# Patient Record
Sex: Female | Born: 1940 | Race: White | Hispanic: No | Smoking: Current every day smoker
Health system: Southern US, Community
[De-identification: ages and names within clinical notes are randomized; demographics above are authoritative.]

## PROBLEM LIST (undated history)

## (undated) ENCOUNTER — Emergency Department (HOSPITAL_COMMUNITY): Discharge status: Absent without leave | Payer: Self-pay

## (undated) DIAGNOSIS — E7800 Pure hypercholesterolemia, unspecified: Secondary | ICD-10-CM

## (undated) DIAGNOSIS — E039 Hypothyroidism, unspecified: Secondary | ICD-10-CM

## (undated) DIAGNOSIS — K219 Gastro-esophageal reflux disease without esophagitis: Secondary | ICD-10-CM

## (undated) DIAGNOSIS — M1990 Unspecified osteoarthritis, unspecified site: Secondary | ICD-10-CM

## (undated) DIAGNOSIS — R0602 Shortness of breath: Secondary | ICD-10-CM

## (undated) DIAGNOSIS — J449 Chronic obstructive pulmonary disease, unspecified: Secondary | ICD-10-CM

## (undated) DIAGNOSIS — R0600 Dyspnea, unspecified: Secondary | ICD-10-CM

## (undated) DIAGNOSIS — I10 Essential (primary) hypertension: Secondary | ICD-10-CM

## (undated) DIAGNOSIS — I739 Peripheral vascular disease, unspecified: Secondary | ICD-10-CM

## (undated) DIAGNOSIS — R413 Other amnesia: Secondary | ICD-10-CM

## (undated) DIAGNOSIS — E669 Obesity, unspecified: Secondary | ICD-10-CM

## (undated) DIAGNOSIS — E0590 Thyrotoxicosis, unspecified without thyrotoxic crisis or storm: Secondary | ICD-10-CM

## (undated) DIAGNOSIS — R0609 Other forms of dyspnea: Secondary | ICD-10-CM

## (undated) HISTORY — DX: Other amnesia: R41.3

## (undated) HISTORY — DX: Chronic obstructive pulmonary disease, unspecified: J44.9

## (undated) HISTORY — DX: Peripheral vascular disease, unspecified: I73.9

## (undated) HISTORY — PX: TUBAL LIGATION: SHX77

## (undated) HISTORY — DX: Hypothyroidism, unspecified: E03.9

## (undated) HISTORY — DX: Dyspnea, unspecified: R06.00

## (undated) HISTORY — DX: Pure hypercholesterolemia, unspecified: E78.00

## (undated) HISTORY — DX: Other forms of dyspnea: R06.09

## (undated) HISTORY — PX: DESCENDING AORTIC ANEURYSM REPAIR W/ STENT: SHX1456

## (undated) HISTORY — DX: Essential (primary) hypertension: I10

## (undated) HISTORY — DX: Obesity, unspecified: E66.9

## (undated) HISTORY — DX: Shortness of breath: R06.02

---

## 1999-04-10 ENCOUNTER — Ambulatory Visit (HOSPITAL_COMMUNITY): Admission: RE | Admit: 1999-04-10 | Discharge: 1999-04-10 | Payer: Self-pay | Admitting: *Deleted

## 2000-04-22 ENCOUNTER — Ambulatory Visit (HOSPITAL_COMMUNITY): Admission: RE | Admit: 2000-04-22 | Discharge: 2000-04-22 | Payer: Self-pay | Admitting: *Deleted

## 2000-10-21 ENCOUNTER — Ambulatory Visit: Admission: RE | Admit: 2000-10-21 | Discharge: 2000-10-21 | Payer: Self-pay | Admitting: Family Medicine

## 2001-11-17 ENCOUNTER — Ambulatory Visit (HOSPITAL_COMMUNITY): Admission: RE | Admit: 2001-11-17 | Discharge: 2001-11-17 | Payer: Self-pay | Admitting: Family Medicine

## 2001-11-24 ENCOUNTER — Encounter: Payer: Self-pay | Admitting: Family Medicine

## 2001-11-24 ENCOUNTER — Encounter: Admission: RE | Admit: 2001-11-24 | Discharge: 2001-11-24 | Payer: Self-pay | Admitting: Family Medicine

## 2002-12-21 ENCOUNTER — Ambulatory Visit (HOSPITAL_COMMUNITY): Admission: RE | Admit: 2002-12-21 | Discharge: 2002-12-21 | Payer: Self-pay | Admitting: Family Medicine

## 2002-12-28 ENCOUNTER — Encounter: Admission: RE | Admit: 2002-12-28 | Discharge: 2002-12-28 | Payer: Self-pay | Admitting: Family Medicine

## 2003-04-26 ENCOUNTER — Encounter: Admission: RE | Admit: 2003-04-26 | Discharge: 2003-04-26 | Payer: Self-pay | Admitting: Internal Medicine

## 2003-06-28 ENCOUNTER — Ambulatory Visit (HOSPITAL_COMMUNITY): Admission: RE | Admit: 2003-06-28 | Discharge: 2003-06-28 | Payer: Self-pay | Admitting: Internal Medicine

## 2003-09-27 ENCOUNTER — Ambulatory Visit: Payer: Self-pay | Admitting: Family Medicine

## 2003-10-21 ENCOUNTER — Ambulatory Visit: Payer: Self-pay | Admitting: *Deleted

## 2003-12-20 ENCOUNTER — Ambulatory Visit: Payer: Self-pay | Admitting: Family Medicine

## 2004-01-03 ENCOUNTER — Ambulatory Visit (HOSPITAL_COMMUNITY): Admission: RE | Admit: 2004-01-03 | Discharge: 2004-01-03 | Payer: Self-pay | Admitting: Internal Medicine

## 2004-04-03 ENCOUNTER — Ambulatory Visit: Payer: Self-pay | Admitting: Internal Medicine

## 2004-06-16 ENCOUNTER — Ambulatory Visit: Payer: Self-pay | Admitting: Family Medicine

## 2004-11-06 ENCOUNTER — Ambulatory Visit: Payer: Self-pay | Admitting: Family Medicine

## 2004-12-28 ENCOUNTER — Ambulatory Visit: Payer: Self-pay | Admitting: Family Medicine

## 2005-08-22 ENCOUNTER — Ambulatory Visit: Payer: Self-pay | Admitting: Family Medicine

## 2005-10-29 ENCOUNTER — Ambulatory Visit: Payer: Self-pay | Admitting: Family Medicine

## 2006-01-01 ENCOUNTER — Ambulatory Visit: Payer: Self-pay | Admitting: Family Medicine

## 2006-01-21 ENCOUNTER — Ambulatory Visit (HOSPITAL_COMMUNITY): Admission: RE | Admit: 2006-01-21 | Discharge: 2006-01-21 | Payer: Self-pay | Admitting: Family Medicine

## 2006-02-05 ENCOUNTER — Ambulatory Visit: Payer: Self-pay | Admitting: Family Medicine

## 2006-04-10 ENCOUNTER — Ambulatory Visit: Payer: Self-pay | Admitting: Family Medicine

## 2006-10-02 ENCOUNTER — Encounter (INDEPENDENT_AMBULATORY_CARE_PROVIDER_SITE_OTHER): Payer: Self-pay | Admitting: *Deleted

## 2006-10-21 ENCOUNTER — Encounter (INDEPENDENT_AMBULATORY_CARE_PROVIDER_SITE_OTHER): Payer: Self-pay | Admitting: Family Medicine

## 2006-10-21 DIAGNOSIS — L408 Other psoriasis: Secondary | ICD-10-CM | POA: Insufficient documentation

## 2006-10-21 DIAGNOSIS — I10 Essential (primary) hypertension: Secondary | ICD-10-CM

## 2006-10-21 DIAGNOSIS — I7389 Other specified peripheral vascular diseases: Secondary | ICD-10-CM | POA: Insufficient documentation

## 2006-11-04 ENCOUNTER — Ambulatory Visit: Payer: Self-pay | Admitting: Internal Medicine

## 2006-11-04 DIAGNOSIS — J45909 Unspecified asthma, uncomplicated: Secondary | ICD-10-CM | POA: Insufficient documentation

## 2006-11-04 LAB — CONVERTED CEMR LAB
ALT: 19 units/L (ref 0–35)
AST: 19 units/L (ref 0–37)
Alkaline Phosphatase: 61 units/L (ref 39–117)
BUN: 15 mg/dL (ref 6–23)
Basophils Relative: 1 % (ref 0–1)
Creatinine, Ser: 0.88 mg/dL (ref 0.40–1.20)
Eosinophils Absolute: 0.4 10*3/uL (ref 0.0–0.7)
Eosinophils Relative: 4 % (ref 0–5)
HCT: 45.4 % (ref 36.0–46.0)
Hemoglobin: 15.4 g/dL — ABNORMAL HIGH (ref 12.0–15.0)
MCHC: 33.9 g/dL (ref 30.0–36.0)
MCV: 91.7 fL (ref 78.0–100.0)
Monocytes Absolute: 0.6 10*3/uL (ref 0.2–0.7)
Monocytes Relative: 7 % (ref 3–11)
RBC: 4.95 M/uL (ref 3.87–5.11)
Total Protein: 7.6 g/dL (ref 6.0–8.3)

## 2006-12-09 ENCOUNTER — Ambulatory Visit: Payer: Self-pay | Admitting: Family Medicine

## 2006-12-16 ENCOUNTER — Ambulatory Visit (HOSPITAL_COMMUNITY): Admission: RE | Admit: 2006-12-16 | Discharge: 2006-12-16 | Payer: Self-pay | Admitting: Family Medicine

## 2007-01-27 ENCOUNTER — Ambulatory Visit (HOSPITAL_COMMUNITY): Admission: RE | Admit: 2007-01-27 | Discharge: 2007-01-27 | Payer: Self-pay | Admitting: Family Medicine

## 2007-03-11 ENCOUNTER — Ambulatory Visit: Payer: Self-pay | Admitting: Family Medicine

## 2007-03-11 ENCOUNTER — Encounter (INDEPENDENT_AMBULATORY_CARE_PROVIDER_SITE_OTHER): Payer: Self-pay | Admitting: Family Medicine

## 2007-06-11 ENCOUNTER — Ambulatory Visit: Payer: Self-pay | Admitting: Internal Medicine

## 2008-03-12 ENCOUNTER — Ambulatory Visit: Payer: Self-pay | Admitting: Internal Medicine

## 2008-05-14 ENCOUNTER — Encounter: Admission: RE | Admit: 2008-05-14 | Discharge: 2008-05-14 | Payer: Self-pay | Admitting: Internal Medicine

## 2008-05-21 ENCOUNTER — Ambulatory Visit (HOSPITAL_COMMUNITY): Admission: RE | Admit: 2008-05-21 | Discharge: 2008-05-21 | Payer: Self-pay | Admitting: Internal Medicine

## 2008-05-28 ENCOUNTER — Encounter: Admission: RE | Admit: 2008-05-28 | Discharge: 2008-05-28 | Payer: Self-pay | Admitting: Internal Medicine

## 2008-06-25 ENCOUNTER — Ambulatory Visit (HOSPITAL_COMMUNITY): Admission: RE | Admit: 2008-06-25 | Discharge: 2008-06-25 | Payer: Self-pay | Admitting: Cardiovascular Disease

## 2008-09-14 ENCOUNTER — Inpatient Hospital Stay (HOSPITAL_COMMUNITY): Admission: RE | Admit: 2008-09-14 | Discharge: 2008-09-15 | Payer: Self-pay | Admitting: Cardiovascular Disease

## 2008-09-14 HISTORY — PX: CARDIAC CATHETERIZATION: SHX172

## 2008-09-14 HISTORY — PX: PERCUTANEOUS STENT INTERVENTION: SHX6019

## 2008-09-20 ENCOUNTER — Emergency Department (HOSPITAL_BASED_OUTPATIENT_CLINIC_OR_DEPARTMENT_OTHER): Admission: EM | Admit: 2008-09-20 | Discharge: 2008-09-20 | Payer: Self-pay | Admitting: Emergency Medicine

## 2008-10-18 ENCOUNTER — Inpatient Hospital Stay (HOSPITAL_COMMUNITY): Admission: RE | Admit: 2008-10-18 | Discharge: 2008-10-20 | Payer: Self-pay | Admitting: Cardiovascular Disease

## 2008-10-18 HISTORY — PX: PERCUTANEOUS STENT INTERVENTION: SHX6019

## 2008-10-18 HISTORY — PX: CARDIAC CATHETERIZATION: SHX172

## 2008-10-19 ENCOUNTER — Ambulatory Visit: Payer: Self-pay | Admitting: Vascular Surgery

## 2009-06-24 ENCOUNTER — Ambulatory Visit (HOSPITAL_COMMUNITY): Admission: RE | Admit: 2009-06-24 | Discharge: 2009-06-24 | Payer: Self-pay | Admitting: Internal Medicine

## 2010-02-05 ENCOUNTER — Encounter: Payer: Self-pay | Admitting: Orthopaedic Surgery

## 2010-04-14 ENCOUNTER — Other Ambulatory Visit: Payer: Self-pay | Admitting: Cardiovascular Disease

## 2010-04-14 ENCOUNTER — Ambulatory Visit
Admission: RE | Admit: 2010-04-14 | Discharge: 2010-04-14 | Disposition: A | Discharge status: Home / Self Care | Payer: 59 | Source: Ambulatory Visit | Attending: Cardiovascular Disease | Admitting: Cardiovascular Disease

## 2010-04-14 DIAGNOSIS — R0602 Shortness of breath: Secondary | ICD-10-CM

## 2010-04-20 ENCOUNTER — Inpatient Hospital Stay (HOSPITAL_COMMUNITY)
Admission: RE | Admit: 2010-04-20 | Discharge: 2010-04-21 | DRG: 253 | Disposition: A | Discharge status: Home / Self Care | Payer: Medicare Other | Source: Ambulatory Visit | Attending: Cardiovascular Disease | Admitting: Cardiovascular Disease

## 2010-04-20 DIAGNOSIS — Z7982 Long term (current) use of aspirin: Secondary | ICD-10-CM

## 2010-04-20 DIAGNOSIS — I10 Essential (primary) hypertension: Secondary | ICD-10-CM | POA: Diagnosis present

## 2010-04-20 DIAGNOSIS — T82898A Other specified complication of vascular prosthetic devices, implants and grafts, initial encounter: Secondary | ICD-10-CM | POA: Diagnosis present

## 2010-04-20 DIAGNOSIS — E669 Obesity, unspecified: Secondary | ICD-10-CM | POA: Diagnosis present

## 2010-04-20 DIAGNOSIS — Z87891 Personal history of nicotine dependence: Secondary | ICD-10-CM

## 2010-04-20 DIAGNOSIS — I70219 Atherosclerosis of native arteries of extremities with intermittent claudication, unspecified extremity: Principal | ICD-10-CM | POA: Diagnosis present

## 2010-04-20 DIAGNOSIS — E785 Hyperlipidemia, unspecified: Secondary | ICD-10-CM | POA: Diagnosis present

## 2010-04-20 DIAGNOSIS — I6529 Occlusion and stenosis of unspecified carotid artery: Secondary | ICD-10-CM | POA: Diagnosis present

## 2010-04-20 DIAGNOSIS — Z79899 Other long term (current) drug therapy: Secondary | ICD-10-CM

## 2010-04-20 DIAGNOSIS — Y849 Medical procedure, unspecified as the cause of abnormal reaction of the patient, or of later complication, without mention of misadventure at the time of the procedure: Secondary | ICD-10-CM | POA: Diagnosis present

## 2010-04-20 HISTORY — PX: PERCUTANEOUS STENT INTERVENTION: SHX6019

## 2010-04-20 LAB — POCT ACTIVATED CLOTTING TIME: Activated Clotting Time: 163 seconds

## 2010-04-20 LAB — BASIC METABOLIC PANEL
Glucose, Bld: 126 mg/dL — ABNORMAL HIGH (ref 70–99)
Potassium: 4.3 mEq/L (ref 3.5–5.1)

## 2010-04-20 LAB — CBC
HCT: 36.5 % (ref 36.0–46.0)
HCT: 36.7 % (ref 36.0–46.0)
Hemoglobin: 12.5 g/dL (ref 12.0–15.0)
Hemoglobin: 13.1 g/dL (ref 12.0–15.0)
MCHC: 34.1 g/dL (ref 30.0–36.0)
MCHC: 34.5 g/dL (ref 30.0–36.0)
MCV: 93.1 fL (ref 78.0–100.0)
MCV: 93.3 fL (ref 78.0–100.0)
MCV: 93.5 fL (ref 78.0–100.0)
Platelets: 231 10*3/uL (ref 150–400)
WBC: 6.2 10*3/uL (ref 4.0–10.5)
WBC: 7.3 10*3/uL (ref 4.0–10.5)
WBC: 9.2 10*3/uL (ref 4.0–10.5)

## 2010-04-21 LAB — BASIC METABOLIC PANEL
BUN: 19 mg/dL (ref 6–23)
CO2: 25 mEq/L (ref 19–32)
CO2: 30 mEq/L (ref 19–32)
Calcium: 9.3 mg/dL (ref 8.4–10.5)
Chloride: 106 mEq/L (ref 96–112)
Chloride: 102 mEq/L (ref 96–112)
Creatinine, Ser: 1.13 mg/dL (ref 0.4–1.2)
Creatinine, Ser: 0.92 mg/dL (ref 0.4–1.2)
Glucose, Bld: 157 mg/dL — ABNORMAL HIGH (ref 70–99)
Glucose, Bld: 113 mg/dL — ABNORMAL HIGH (ref 70–99)

## 2010-04-21 LAB — CBC
HCT: 38 % (ref 36.0–46.0)
Hemoglobin: 12.8 g/dL (ref 12.0–15.0)
MCH: 30.8 pg (ref 26.0–34.0)
MCHC: 34.1 g/dL (ref 30.0–36.0)
MCHC: 33.9 g/dL (ref 30.0–36.0)
MCV: 91.6 fL (ref 78.0–100.0)
MCV: 92.8 fL (ref 78.0–100.0)
Platelets: 259 10*3/uL (ref 150–400)
Platelets: 218 10*3/uL (ref 150–400)
RBC: 4.15 MIL/uL (ref 3.87–5.11)
RBC: 4.7 MIL/uL (ref 3.87–5.11)
RDW: 14 % (ref 11.5–15.5)
WBC: 7.2 10*3/uL (ref 4.0–10.5)

## 2010-04-22 LAB — URINALYSIS, ROUTINE W REFLEX MICROSCOPIC
Glucose, UA: NEGATIVE mg/dL
Ketones, ur: NEGATIVE mg/dL
Nitrite: NEGATIVE
Protein, ur: NEGATIVE mg/dL
Urobilinogen, UA: 0.2 mg/dL (ref 0.0–1.0)

## 2010-04-22 LAB — BASIC METABOLIC PANEL
CO2: 24 mEq/L (ref 19–32)
Calcium: 8.9 mg/dL (ref 8.4–10.5)
Chloride: 101 mEq/L (ref 96–112)
Creatinine, Ser: 0.97 mg/dL (ref 0.4–1.2)
GFR calc Af Amer: >60 mL/min (ref >60)
Sodium: 134 mEq/L — ABNORMAL LOW (ref 135–145)

## 2010-04-22 LAB — PROTIME-INR
INR: 0.9 (ref 0.00–1.49)
Prothrombin Time: 12.1 seconds (ref 11.6–15.2)

## 2010-04-22 LAB — CBC
Hemoglobin: 14.2 g/dL (ref 12.0–15.0)
MCHC: 34.5 g/dL (ref 30.0–36.0)
RBC: 4.46 MIL/uL (ref 3.87–5.11)
WBC: 6.7 10*3/uL (ref 4.0–10.5)

## 2010-04-22 LAB — TSH: TSH: 7.043 u[IU]/mL — ABNORMAL HIGH (ref 0.350–4.500)

## 2010-04-25 NOTE — Procedures (Signed)
[name], [name]                ACCOUNT NO.:  [id number]  MEDICAL RECORD NO.:  [id number]           PATIENT TYPE:  O  LOCATION:  6529                         FACILITY:  [facility]  PHYSICIAN:  Nanetta Batty, M.D.   DATE OF BIRTH:  01-Sep-1940  DATE OF PROCEDURE:  04/20/2010 DATE OF DISCHARGE:                   PERIPHERAL VASCULAR INVASIVE PROCEDURE   Abdominal aortogram with bifemoral runoff, PTA, and stent report.  [name] is a 69 year old mildly overweight divorced Caucasian female, mother of three, grandmother of four grandchildren who I recently saw on March 23, 2010.  She has a history of normal coronaries by cath September 14, 2008, and high-grade aortoiliac disease with claudication.  Risks factors include discontinued tobacco abuse, hypertension, hyperlipidemia.  Angiogram to her initially on September 14, 2008, revealing a 90% ostial right common iliac artery stenosis, which I stented with a 10 x 4 Smart stent.  She had an occluded left common iliac.  I brought her back on October 18, 2008, and attempted to recanalize the left common iliac unsuccessfully.  The plan was to do a fem-fem crossover graft, she never pursued this.  She continues to complain of claudication.  Dopplers recently performed earlier this month suggested "in-stent restenosis within the right common iliac artery stent."  She presents now for re-angiography and intervention prior to anticipated surgical revascularization.  PROCEDURE DESCRIPTION:  The patient was brought to the second floor [facility] angiographic suite in the post absorptive state.  She is premedicated with p.o. Valium, IV Versed and fentanyl.  Her right groin was prepped and shaved in usual sterile fashion.  Xylocaine 1% was used for local anesthesia.  A 5-French sheath was inserted into the right femoral artery using standard Seldinger technique.  A 5-French tennis racket catheter was used for midstream distal abdominal aortography  with bifemoral runoff using bolus chase digital subtraction step table technique.  Visipaque dye was used for the entirety of the case. Retrograde aortic pressure was monitored during the case.  ANGIOGRAPHIC RESULTS: 1. Abdominal aorta.     a.     Renal arteries - Normal.     b.     Infrarenal abdominal aorta - Normal. 2. Left lower extremity;     a.     Ostial/proximal left common iliac artery stenosis with      reconstitution by collaterals in the external iliac artery.     b.     Three-vessel runoff. 3. Right lower extremity;     a.     70% "in-stent restenosis within the proximal portion of the      right common iliac artery stent.  There was a 50-mm pullback      gradient after administration of 200 mcg of intra-arterial      nitroglycerin.     b.     Two-vessel runoff.  PROCEDURE DESCRIPTION:  The patient received 5000 units of heparin intravenously.  5-French sheath was exchanged over a Wholey wire for a 6- [place] bright tip long sheath.  PTA and stenting was performed of the area of "in-stent restenosis of 8 x 18 genesis on OPTA stent balloon pre- mount.  This deployed  at 8 atmospheres resulting in reduction of a 70% stenosis to 0% residual.  There was mild abdominal pain with deployment, which resolved with balloon deflation.  The guidewire and balloon catheter removed.  ACT was measured.  The patient left the lab in stable condition.  She will be hydrated overnight.  Sheath will be removed once the ACT falls below 170.  The patient was treated aspirin as well. Apparently, she is allergic to PLAVIX.  She will be discharged in the morning and get followup Dopplers and ABIs.  I have discussed the case with Dr. Waverly Ferrari who has felt that reintervention and fem- fem crossover grafting was a better option given the patient's body habitus.  He will see her prior to her discharge tomorrow.     Nanetta Batty, M.D.     JB/MEDQ  D:  04/20/2010  T:  04/21/2010   Job:  [id number]  cc:   [facility] [place] R. Renae Gloss, M.D.  Electronically Signed by Nanetta Batty M.D. on 04/25/2010 10:49:04 AM

## 2010-05-02 ENCOUNTER — Encounter (HOSPITAL_COMMUNITY)
Admission: RE | Admit: 2010-05-02 | Discharge: 2010-05-02 | Disposition: A | Discharge status: Home / Self Care | Payer: PRIVATE HEALTH INSURANCE | Source: Ambulatory Visit | Attending: Vascular Surgery | Admitting: Vascular Surgery

## 2010-05-02 LAB — URINE MICROSCOPIC-ADD ON

## 2010-05-02 LAB — ABO/RH: ABO/RH(D): A POS

## 2010-05-02 LAB — URINALYSIS, ROUTINE W REFLEX MICROSCOPIC
Bilirubin Urine: NEGATIVE
Glucose, UA: NEGATIVE mg/dL
Hgb urine dipstick: NEGATIVE
Ketones, ur: NEGATIVE mg/dL
Nitrite: NEGATIVE
Specific Gravity, Urine: 1.016 (ref 1.005–1.030)
pH: 5.5 (ref 5.0–8.0)

## 2010-05-02 LAB — COMPREHENSIVE METABOLIC PANEL
AST: 39 U/L — ABNORMAL HIGH (ref 0–37)
Albumin: 3.9 g/dL (ref 3.5–5.2)
Alkaline Phosphatase: 55 U/L (ref 39–117)
BUN: 13 mg/dL (ref 6–23)
Chloride: 104 mEq/L (ref 96–112)
Creatinine, Ser: 1.14 mg/dL (ref 0.4–1.2)
GFR calc Af Amer: 58 mL/min — ABNORMAL LOW (ref >60)
Potassium: 4.4 mEq/L (ref 3.5–5.1)
Total Protein: 6.8 g/dL (ref 6.0–8.3)

## 2010-05-02 LAB — CBC
HCT: 40.5 % (ref 36.0–46.0)
MCHC: 33.3 g/dL (ref 30.0–36.0)
Platelets: 233 10*3/uL (ref 150–400)
RDW: 14.7 % (ref 11.5–15.5)
WBC: 7.8 10*3/uL (ref 4.0–10.5)

## 2010-05-02 LAB — TYPE AND SCREEN
ABO/RH(D): A POS
Antibody Screen: NEGATIVE

## 2010-05-02 LAB — APTT: aPTT: 26 seconds (ref 24–37)

## 2010-05-02 LAB — SURGICAL PCR SCREEN: MRSA, PCR: NEGATIVE

## 2010-05-02 NOTE — H&P (Addendum)
[name], [name]                ACCOUNT NO.:  [id number]  MEDICAL RECORD NO.:  [id number]           PATIENT TYPE:  O  LOCATION:  6529                         FACILITY:  [facility]  PHYSICIAN:  Di Kindle. [name], M.D.DATE OF BIRTH:  03-05-1940  DATE OF ADMISSION:  04/20/2010 DATE OF DISCHARGE:                             HISTORY & PHYSICAL   DATE OF PLANNED ADMISSION:  May 09, 2010.  REASON FOR ADMISSION:  Disabling left lower extremity claudication with plans for elective right to left fem-fem bypass graft.  HISTORY:  This is a pleasant 70 year old woman who I had originally seen in consultation on October 19, 2008.  She had a long history of bilateral lower extremity claudication.  She had a chronic left common iliac artery occlusion, which could not be addressed from an endovascular standpoint, and she had undergone successful PTA and stenting of a right common iliac artery stenosis.  At that time, we were considering fem-fem bypass grafting, however, symptoms improved somewhat after her iliac stent in the right presumably secondary to increased collateral flow from her increased perfusion of the right hypogastric artery.  She was then lost to followup.  She was seen by Dr. Allyson Sabal and presented with progressive bilateral lower extremity claudication.  She underwent an arteriogram today, which showed some stenosis of the proximal right common iliac artery just above the previously placed stent, and this was successfully ballooned and stented with an excellent result.  I was consulted today to evaluate her for possible bypass grafting given that she had progressive claudication in the left lower extremity.  The patient describes a long history of pain in the left hip brought on by ambulation and relieved with rest.  This occurs at approximately one block of ambulation and does not occur simply with standing.  Over the last year, she states that these symptoms in the left side  have gradually progressed.  On the right side, she had developed the gradual onset of right thigh and calf claudication.  This too was brought on by ambulation and relieved with rest.  She had no history of nonhealing wounds or rest pain on either side.  PAST MEDICAL HISTORY:  Significant for hypertension and obesity.  She denies any history of diabetes, history of hypercholesterolemia, history of previous myocardial infarction, history of congestive heart failure, history of COPD.  PAST SURGICAL HISTORY:  Significant for previous tubal ligation.  She has had no previous abdominal surgery.  FAMILY HISTORY:  She is unaware of any history of premature cardiovascular disease.  SOCIAL HISTORY:  She is divorced.  She has three daughters.  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  She had smoked 1 to 1-1/2 packs per day for 47 years, but quit smoking 3 months ago.  REVIEW OF SYSTEMS:  GENERAL:  She had no recent weight loss, weight gain, or problems with her appetite.  CARDIOVASCULAR:  She had no chest pain, chest pressure, palpitations, or arrhythmias.  She does admit to some dyspnea on exertion.  She has no significant orthopnea.  She has had no history of stroke,  TIAs, or amaurosis fugax.  She had no history of DVT.  PULMONARY:  She has had no productive cough, bronchitis, asthma, or wheezing.  GI:  She has had no recent change in her bowel habits and has no history of peptic ulcer disease.  GU:  She has had no dysuria or frequency.  NEUROLOGIC:  She has had no dizziness, blackouts, headaches, or seizures.  MUSCULOSKELETAL:  She has had no significant arthritis, joint pain, or muscle pain.  PSYCHIATRIC:  She has had no depression, anxiety.  HEENT:  She had no significant change in her eyesight or hearing.  HEMATOLOGIC:  She has had no bleeding problems or clotting disorders.  INTEGUMENTARY:  She has had no rashes.  ALLERGIES: 1. PENICILLIN, which causes a  rash. 2. AMOXICILLIN, which causes a rash. 3. CONTRAST DYE, which causes a rash. 4. PLAVIX,  which causes rash, hives, and itching.  MEDICATIONS: 1. Calcium with magnesium 600 mg OTC 1 p.o. daily. 2. Vitamin D3 1000 units OTC 1 p.o. daily. 3. Bupropion SR 150 mg p.o. daily. 4. Levothroid (levothyroxine) 50 mcg p.o. daily. 5. Omeprazole 40 mg p.o. daily. 6. Losartan/hydrochlorothiazide 100/25 mg 1 p.o. daily. 7. Norvasc 5 mg p.o. daily. 8. Coreg 6.25 mg p.o. b.i.d. 9. Aspirin 325 mg p.o. daily.  PHYSICAL EXAMINATION:  GENERAL:  This is a pleasant 70 year old woman who appears her stated age.  She has moderate obesity. VITAL SIGNS:  Blood pressure is 127/63, heart rate is 74, saturation 99%, respiratory rate is 12. HEENT:  Unremarkable. LUNGS:  Clear bilaterally to auscultation without rales, rhonchi, or wheezing. CARDIOVASCULAR:  I do not detect any carotid bruits.  She has a regular rate and rhythm.  She has palpable radial pulses bilaterally.  She has a palpable right femoral pulse.  I cannot palpate a left femoral pulse. She has a palpable right dorsalis pedis pulse.  I cannot palpate pedal pulses on the left side.  She has no ischemic ulcers on her feet. ABDOMEN:  Obese and difficult to assess.  She has normal pitched bowel sounds with no masses appreciated. MUSCULOSKELETAL:  No major deformities or cyanosis. NEUROLOGIC:  She has no focal weakness or paresthesias. SKIN:  There are no ulcers or rashes.  I have independently reviewed her arteriogram from today, which shows a recurrent right common iliac artery stenosis proximally, which was successfully ballooned and stented with no residual gradient with an excellent result.  She has chronic occlusion of the left common iliac artery.  She has reconstitution of the common femoral artery on the left with no significant infrainguinal arterial occlusive disease bilaterally.  I have also reviewed her [facility], 12-lead EKG showed a sinus rhythm with a rate of 81.  No ST or T- wave changes.  This patient presents with disabling claudication of the left lower extremity, which she feels is prohibiting her from walking.  Symptoms have gradually progressed.  Fortunately, she has quit smoking, which is tremendously helpful.  She would like to pursue revascularization.  I have discussed the option of nonoperative treatment including a structured walking program.  She feels this is not been successful.  I have explained it in terms of revascularization.  She could not be recanalized from an endovascular standpoint, and I think that the best and safest option is a right-to-left fem-fem bypass graft.  We have discussed the indications for surgery and the potential complications including but not limited to bleeding, infection, wound healing problems, graft infection,  and graft thrombosis.  She understands that she has increased risk for infection and graft infection given her obesity.  I have quoted a 5-year patency of 70% for a fem-fem bypass graft.  All of her questions are answered.  She is agreeable to proceed.  Her surgery has been scheduled for May 09, 2010.     Di Kindle. [name], M.D.     CSD/MEDQ  D:  04/20/2010  T:  04/21/2010  Job:  [id number]  Electronically Signed by Waverly Ferrari M.D. on 05/02/2010 08:37:30 AM

## 2010-05-09 ENCOUNTER — Inpatient Hospital Stay (HOSPITAL_COMMUNITY)
Admission: RE | Admit: 2010-05-09 | Discharge: 2010-05-12 | DRG: 254 | Disposition: A | Discharge status: Home Health | Payer: PRIVATE HEALTH INSURANCE | Source: Ambulatory Visit | Attending: Vascular Surgery | Admitting: Vascular Surgery

## 2010-05-09 DIAGNOSIS — I743 Embolism and thrombosis of arteries of the lower extremities: Secondary | ICD-10-CM

## 2010-05-09 DIAGNOSIS — I70219 Atherosclerosis of native arteries of extremities with intermittent claudication, unspecified extremity: Principal | ICD-10-CM | POA: Diagnosis present

## 2010-05-09 DIAGNOSIS — Z87891 Personal history of nicotine dependence: Secondary | ICD-10-CM

## 2010-05-09 DIAGNOSIS — E669 Obesity, unspecified: Secondary | ICD-10-CM | POA: Diagnosis present

## 2010-05-09 DIAGNOSIS — Z7982 Long term (current) use of aspirin: Secondary | ICD-10-CM

## 2010-05-09 DIAGNOSIS — E039 Hypothyroidism, unspecified: Secondary | ICD-10-CM | POA: Diagnosis present

## 2010-05-09 DIAGNOSIS — I10 Essential (primary) hypertension: Secondary | ICD-10-CM | POA: Diagnosis present

## 2010-05-09 DIAGNOSIS — I7092 Chronic total occlusion of artery of the extremities: Secondary | ICD-10-CM | POA: Diagnosis present

## 2010-05-09 DIAGNOSIS — K219 Gastro-esophageal reflux disease without esophagitis: Secondary | ICD-10-CM | POA: Diagnosis present

## 2010-05-09 HISTORY — PX: FEMORAL-FEMORAL BYPASS GRAFT: SHX936

## 2010-05-10 DIAGNOSIS — I739 Peripheral vascular disease, unspecified: Secondary | ICD-10-CM

## 2010-05-10 LAB — CBC
HCT: 34.4 % — ABNORMAL LOW (ref 36.0–46.0)
Hemoglobin: 11.2 g/dL — ABNORMAL LOW (ref 12.0–15.0)
RBC: 3.69 MIL/uL — ABNORMAL LOW (ref 3.87–5.11)

## 2010-05-10 LAB — BASIC METABOLIC PANEL
BUN: 13 mg/dL (ref 6–23)
Calcium: 8.8 mg/dL (ref 8.4–10.5)
GFR calc non Af Amer: 47 mL/min — ABNORMAL LOW (ref >60)
Glucose, Bld: 161 mg/dL — ABNORMAL HIGH (ref 70–99)

## 2010-05-10 NOTE — Discharge Summary (Signed)
  [name], ULLMAN                ACCOUNT NO.:  [id number]  MEDICAL RECORD NO.:  [id number]           PATIENT TYPE:  O  LOCATION:  6529                         FACILITY:  [facility]  PHYSICIAN:  Nanetta Batty, M.D.   DATE OF BIRTH:  07-Mar-1940  DATE OF ADMISSION:  04/20/2010 DATE OF DISCHARGE:  04/21/2010                              DISCHARGE SUMMARY   DISCHARGE DIAGNOSES: 1. Increasing left lower extremity claudication. 2. Peripheral vascular disease with previous high-grade aortoiliac     disease and stenting on the right.     a.     Peripheral vascular angiogram with occluded left CIA totally      occluded with need for a rt. to lt fem-fem bypass, right 70% in-stent      restenosis to the right CIA undergoing percutaneous transluminal      angioplasty and re-stent by Dr. Allyson Sabal. 3. Hypertension. 4. Hyperlipidemia. 5. History of right carotid stenosis.  DISCHARGE CONDITION:  Improved.  PROCEDURES:  Peripheral vascular angiogram and percutaneous coronary intervention by Dr. Allyson Sabal.  Please note, Dr. Edilia Wiggins saw the patient in the hospital and plans will be for right to left fem-fem bypass on May 09, 2010.  DISCHARGE MEDICATIONS:  See medication reconciliation sheet.  DISCHARGE INSTRUCTIONS: 1. Increase activity slowly.  May shower.  No lifting for 1 week.  No     driving for 2 days. 2. Heart-healthy low-sodium diet. 3. Wash cath site with soap and water.  Call if any bleeding, swelling     or drainage. 4. Follow up with Dr. Allyson Sabal and the office will call with date and     time. 5. She will be scheduled for a lower extremity Doppler and the office     would call.  Please note, the patient has an allergy to PLAVIX, is only on aspirin in addition for her surgery.  LABORATORY DATA:  At discharge, hemoglobin 12.8, hematocrit 38, WBC 16.2, platelets 259.  Sodium 140, potassium 4, chloride 106, CO2 25, BUN 19, creatinine 1.13.  The patient was seen by Dr. Allyson Sabal and will  follow up as an outpatient in addition to her surgery.  The patient presented and underwent PTA and re-stent to her in-stent restenosis in the right and was seen by Dr. Edilia Wiggins for plans for right to left fem-fem bypass grafting.  By the next morning, she ambulated without problems and will follow up as instructed.     [name]. [name], N.P.   ______________________________ Nanetta Batty, M.D.    LRI/MEDQ  D:  04/24/2010  T:  04/25/2010  Job:  [id number]  cc:   Di Kindle. [name], M.D. Dr. Pryor Ochoa R. Renae Gloss, M.D.  Electronically Signed by [name] N.P. on 04/25/2010 05:14:35 PM Electronically Signed by Nanetta Batty M.D. on 05/10/2010 04:08:35 PM

## 2010-05-11 NOTE — Op Note (Signed)
[name], [name]                ACCOUNT NO.:  [id number]  MEDICAL RECORD NO.:  [id number]           PATIENT TYPE:  I  LOCATION:  3307                         FACILITY:  [facility]  PHYSICIAN:  Di Kindle. [name], M.D.DATE OF BIRTH:  [birth year]  DATE OF PROCEDURE:  05/09/2010 DATE OF DISCHARGE:                              OPERATIVE REPORT   PREOPERATIVE DIAGNOSIS:  Left lower extremity claudication with left common iliac artery occlusion.  POSTOPERATIVE DIAGNOSIS:  Left lower extremity claudication with left common iliac artery occlusion.  PROCEDURES: 1. A vein patch angioplasty of the right common femoral artery. 2. Right-to-left fem-fem bypass graft with an 8-mm interring PTFE     graft. 3. Placement of incisional vac.  SURGEON:  Di Kindle. [name], MD  ASSISTANT:  Janetta Hora. Fields, MD and Darl Householder, PA-C  ANESTHESIA:  General.  INDICATIONS:  This is a 70 year old woman who has had a long history of a left common iliac artery occlusion.  [name] has had previous PTA and stenting by Dr. Allyson Sabal of a proximal right common iliac artery stenosis. [name] had disabling claudication on the left and wished to pursue revascularization, it was felt that right-to-left fem-fem bypass grafting was the best option.  The procedure and potential complications including her increased risk of wound infection because of her obesity was discussed with the patient preoperatively.  All questions were answered and [name] was agreeable to proceed.  TECHNIQUE:  The patient was taken to the operating room and received a general anesthetic.  Her pannus was taped superiorly.  Both groins were prepped and draped in usual sterile fashion.  An oblique incision was made above the inguinal crease on the right and through this incision, the common femoral, superficial femoral, and deep femoral arteries were dissected free.  This was obviously a quite deep.  There was some plaque within the  common femoral artery and therefore, I felt that I would likely require a patch on the common femoral artery.  Therefore, through the same incision, I harvested a segment of saphenous vein which was ligated distally and then opened and spatulated.  Of note, the bifurcation was fairly high not far below the inguinal ligament.  This would obviously provide some challenge in terms of allowing and nice lie of the fem-fem graft.  Now, I felt that it was probably again have to be sewn essentially right angle so that when her pannus was released.  [name] would not kink her graft.  Same issue would be a problem on the left side.  Oblique incision was made in the left above the inguinal crease and again in the depths of the wound, the common femoral, superficial femoral and deep femoral arteries were dissected free.  This artery was soft and had no significant plaque at this level.  An 8-mm PTFE graft was tunneled between the two incisions and tunneled fairly close to the fascia.  The patient was then heparinized.  On the right side, the common femoral, superficial femoral and deep femoral arteries were controlled and a longitudinal arteriotomy was made.  I extended this onto the superficial femoral artery and  up above the area of the plaque and then sewed the vein patch, which was with right greater saphenous vein, use continuous 6-0 Prolene suture.  The vein patch was then opened longitudinally and then the right limb of the fem-fem graft sewn end to side to the vein patch using continuous 6-0 Prolene suture.  Again, this was essentially in her right angle because of the high bifurcation and her large abdomen and I did not want to have kinking when [name] stood up. The anastomosis was then completed and the artery backbled and flushed and then the graft was clamped.  The graft was then pulled to the appropriate length for anastomosis to the left groin.  The common femoral, superficial femoral and deep  femoral arteries were controlled. Longitudinal arteriotomy was made in the common femoral artery.  The graft was cut to the appropriate length and sewn end-to-side to the common femoral artery using continuous 6-0 Prolene suture.  The arteries were backbled and flushed appropriate, and the anastomosis completed and flow reestablished to the left leg.  At the completion, there was an excellent Doppler flow distal to the bypass graft in left, which augmented with the graft open.  Hemostasis was obtained in the wounds and the heparin was partially reversed with protamine.  Once good hemostasis was obtained, the wounds were closed with two deep layers of 2-0 Vicryl and the skin closed with 4-0 subcuticular stitch.  An incisional vac was placed on both groins.  Sterile dressing was applied. The patient tolerated the procedure well and was transferred to the recovery in stable condition.  All needle and sponge counts were correct.     Di Kindle. [name], M.D.     CSD/MEDQ  D:  05/09/2010  T:  05/09/2010  Job:  [id number]  cc:   Nanetta Batty, M.D.  Electronically Signed by Waverly Ferrari M.D. on 05/11/2010 10:51:46 AM

## 2010-05-14 ENCOUNTER — Emergency Department (HOSPITAL_COMMUNITY)
Admission: EM | Admit: 2010-05-14 | Discharge: 2010-05-14 | Disposition: A | Discharge status: Home / Self Care | Payer: PRIVATE HEALTH INSURANCE | Attending: Emergency Medicine | Admitting: Emergency Medicine

## 2010-05-14 ENCOUNTER — Emergency Department (HOSPITAL_COMMUNITY): Payer: PRIVATE HEALTH INSURANCE

## 2010-05-14 ENCOUNTER — Inpatient Hospital Stay (HOSPITAL_COMMUNITY)
Admission: EM | Admit: 2010-05-14 | Discharge: 2010-05-18 | DRG: 189 | Disposition: A | Discharge status: Home / Self Care | Payer: PRIVATE HEALTH INSURANCE | Attending: Internal Medicine | Admitting: Internal Medicine

## 2010-05-14 DIAGNOSIS — F3289 Other specified depressive episodes: Secondary | ICD-10-CM | POA: Diagnosis present

## 2010-05-14 DIAGNOSIS — Z87891 Personal history of nicotine dependence: Secondary | ICD-10-CM

## 2010-05-14 DIAGNOSIS — I739 Peripheral vascular disease, unspecified: Secondary | ICD-10-CM | POA: Insufficient documentation

## 2010-05-14 DIAGNOSIS — J45909 Unspecified asthma, uncomplicated: Secondary | ICD-10-CM | POA: Insufficient documentation

## 2010-05-14 DIAGNOSIS — I10 Essential (primary) hypertension: Secondary | ICD-10-CM | POA: Diagnosis present

## 2010-05-14 DIAGNOSIS — J441 Chronic obstructive pulmonary disease with (acute) exacerbation: Secondary | ICD-10-CM | POA: Diagnosis present

## 2010-05-14 DIAGNOSIS — J9600 Acute respiratory failure, unspecified whether with hypoxia or hypercapnia: Principal | ICD-10-CM | POA: Diagnosis present

## 2010-05-14 DIAGNOSIS — R0602 Shortness of breath: Secondary | ICD-10-CM | POA: Insufficient documentation

## 2010-05-14 DIAGNOSIS — R0682 Tachypnea, not elsewhere classified: Secondary | ICD-10-CM | POA: Insufficient documentation

## 2010-05-14 DIAGNOSIS — F329 Major depressive disorder, single episode, unspecified: Secondary | ICD-10-CM | POA: Diagnosis present

## 2010-05-14 DIAGNOSIS — R059 Cough, unspecified: Secondary | ICD-10-CM | POA: Insufficient documentation

## 2010-05-14 DIAGNOSIS — J209 Acute bronchitis, unspecified: Secondary | ICD-10-CM | POA: Diagnosis present

## 2010-05-14 DIAGNOSIS — J440 Chronic obstructive pulmonary disease with acute lower respiratory infection: Secondary | ICD-10-CM | POA: Diagnosis present

## 2010-05-14 DIAGNOSIS — R05 Cough: Secondary | ICD-10-CM | POA: Insufficient documentation

## 2010-05-14 DIAGNOSIS — B370 Candidal stomatitis: Secondary | ICD-10-CM | POA: Diagnosis not present

## 2010-05-14 DIAGNOSIS — Z9889 Other specified postprocedural states: Secondary | ICD-10-CM

## 2010-05-14 DIAGNOSIS — I2510 Atherosclerotic heart disease of native coronary artery without angina pectoris: Secondary | ICD-10-CM | POA: Insufficient documentation

## 2010-05-14 DIAGNOSIS — E039 Hypothyroidism, unspecified: Secondary | ICD-10-CM | POA: Diagnosis present

## 2010-05-14 LAB — CARDIAC PANEL(CRET KIN+CKTOT+MB+TROPI)
CK, MB: 2.4 ng/mL (ref 0.3–4.0)
Relative Index: INVALID (ref 0.0–2.5)
Relative Index: INVALID (ref 0.0–2.5)
Total CK: 92 U/L (ref 7–177)
Troponin I: 0.01 ng/mL (ref 0.00–0.06)

## 2010-05-14 LAB — BASIC METABOLIC PANEL
BUN: 19 mg/dL (ref 6–23)
CO2: 30 mEq/L (ref 19–32)
Chloride: 105 mEq/L (ref 96–112)
GFR calc non Af Amer: 47 mL/min — ABNORMAL LOW (ref >60)
Glucose, Bld: 138 mg/dL — ABNORMAL HIGH (ref 70–99)
Potassium: 4.4 mEq/L (ref 3.5–5.1)

## 2010-05-14 LAB — POCT CARDIAC MARKERS: CKMB, poc: 1.5 ng/mL (ref 1.0–8.0)

## 2010-05-14 LAB — DIFFERENTIAL
Eosinophils Absolute: 0.6 10*3/uL (ref 0.0–0.7)
Eosinophils Relative: 7 % — ABNORMAL HIGH (ref 0–5)
Lymphs Abs: 1.6 10*3/uL (ref 0.7–4.0)
Monocytes Absolute: 0.9 10*3/uL (ref 0.1–1.0)

## 2010-05-14 LAB — CBC
MCHC: 33.2 g/dL (ref 30.0–36.0)
MCV: 92.2 fL (ref 78.0–100.0)
Platelets: 235 10*3/uL (ref 150–400)
RDW: 14 % (ref 11.5–15.5)
WBC: 8.1 10*3/uL (ref 4.0–10.5)

## 2010-05-14 LAB — TSH: TSH: 3.018 u[IU]/mL (ref 0.350–4.500)

## 2010-05-15 ENCOUNTER — Inpatient Hospital Stay (HOSPITAL_COMMUNITY): Payer: PRIVATE HEALTH INSURANCE

## 2010-05-15 DIAGNOSIS — R0602 Shortness of breath: Secondary | ICD-10-CM

## 2010-05-15 LAB — BASIC METABOLIC PANEL
BUN: 19 mg/dL (ref 6–23)
Creatinine, Ser: 1.08 mg/dL (ref 0.4–1.2)
GFR calc non Af Amer: 51 mL/min — ABNORMAL LOW (ref >60)
Glucose, Bld: 121 mg/dL — ABNORMAL HIGH (ref 70–99)

## 2010-05-15 LAB — CBC
HCT: 33.4 % — ABNORMAL LOW (ref 36.0–46.0)
MCH: 29.6 pg (ref 26.0–34.0)
MCHC: 32.3 g/dL (ref 30.0–36.0)
MCV: 91.5 fL (ref 78.0–100.0)
Platelets: 229 10*3/uL (ref 150–400)
RDW: 13.6 % (ref 11.5–15.5)

## 2010-05-15 LAB — CARDIAC PANEL(CRET KIN+CKTOT+MB+TROPI)
CK, MB: 2.2 ng/mL (ref 0.3–4.0)
Troponin I: 0.01 ng/mL (ref 0.00–0.06)

## 2010-05-15 MED ORDER — TECHNETIUM TO 99M ALBUMIN AGGREGATED
5.5000 | Freq: Once | INTRAVENOUS | Status: AC | PRN
  Administered 2010-05-15: 5.5 via INTRAVENOUS

## 2010-05-15 MED ORDER — XENON XE 133 GAS
7.8000 | GAS_FOR_INHALATION | Freq: Once | RESPIRATORY_TRACT | Status: AC | PRN
  Administered 2010-05-15: 7.8 via RESPIRATORY_TRACT

## 2010-05-16 LAB — BASIC METABOLIC PANEL
CO2: 31 mEq/L (ref 19–32)
Calcium: 9.6 mg/dL (ref 8.4–10.5)
Chloride: 101 mEq/L (ref 96–112)
Creatinine, Ser: 1.38 mg/dL — ABNORMAL HIGH (ref 0.4–1.2)
GFR calc Af Amer: 46 mL/min — ABNORMAL LOW (ref >60)
Glucose, Bld: 98 mg/dL (ref 70–99)
Sodium: 140 mEq/L (ref 135–145)

## 2010-05-16 LAB — CBC
HCT: 35.4 % — ABNORMAL LOW (ref 36.0–46.0)
Hemoglobin: 11.6 g/dL — ABNORMAL LOW (ref 12.0–15.0)
MCH: 30.1 pg (ref 26.0–34.0)
MCHC: 32.8 g/dL (ref 30.0–36.0)
RBC: 3.85 MIL/uL — ABNORMAL LOW (ref 3.87–5.11)

## 2010-05-17 LAB — CBC
Hemoglobin: 11.7 g/dL — ABNORMAL LOW (ref 12.0–15.0)
MCH: 30 pg (ref 26.0–34.0)
MCHC: 32.6 g/dL (ref 30.0–36.0)
MCV: 92.1 fL (ref 78.0–100.0)
RBC: 3.9 MIL/uL (ref 3.87–5.11)

## 2010-05-17 LAB — BASIC METABOLIC PANEL
BUN: 39 mg/dL — ABNORMAL HIGH (ref 6–23)
CO2: 29 mEq/L (ref 19–32)
Chloride: 101 mEq/L (ref 96–112)
GFR calc Af Amer: 43 mL/min — ABNORMAL LOW (ref >60)
GFR calc non Af Amer: 35 mL/min — ABNORMAL LOW (ref >60)
Glucose, Bld: 88 mg/dL (ref 70–99)

## 2010-05-17 NOTE — Discharge Summary (Signed)
NAMEJERRIE, [name]                ACCOUNT NO.:  [id number]  MEDICAL RECORD NO.:  [id number]           PATIENT TYPE:  I  LOCATION:  2037                         FACILITY:  [facility]  PHYSICIAN:  Di Kindle. [name], M.D.DATE OF BIRTH:  09/17/1940  DATE OF ADMISSION:  05/09/2010 DATE OF DISCHARGE:  05/12/2010                              DISCHARGE SUMMARY   ADMISSION DIAGNOSIS:  Left lower extremity claudication with left common iliac artery occlusion.  HISTORY OF PRESENT ILLNESS:  This is a 70 year old woman who was originally seen in consultation October 19, 2008 by Dr. [name].  She had a long history of bilateral lower extremity claudication.  She had a chronic left common iliac artery occlusion which could not be addressed from an endovascular standpoint and she had undergone successful PTA and stenting of a right common iliac artery stenosis.  At that time, we were considering fem-fem bypass grafting, however, her symptoms improved somewhat after her iliac stent in the right presumably secondary to increased collateral flow from her increased perfusion of the right hypogastric artery.  She was then lost to follow up.  She was seen by Dr. Allyson Sabal and presented with progressive bilateral lower extremity claudication.  She underwent an arteriogram which revealed some stenosis of the proximal right common iliac artery just above the previously placed stent and this was successfully ballooned and stented with an excellent result.  Dr. [name] was consulted to evaluate her for possible bypass grafting given that she had progressive claudication in the left lower extremity.  The patient describes a long history of pain in the left hip brought on by ambulation and relieved with rest.  This occurs at approximately 1 block of ambulation and does not occur simply with standing.  Over the last year, she states that these symptoms in the left side have gradually progressed.  On the right  side, she has developed a gradual onset of right thigh and calf claudication.  This too is brought on by ambulation and relieved with rest.  She has no history of nonhealing wound or rest pain on either side.  HOSPITAL COURSE:  The patient  was admitted to the hospital and taken to the operating room on May 09, 2010 where she underwent a vein patch angioplasty of the right common femoral artery as well as right to left fem-fem bypass graft with an 8-mm entering PTFE graft and placement of incisional VACS.  She tolerated the procedure well and was transported to the recovery room in satisfactory condition.  On postoperative day #1, she had brisk Doppler flow bilaterally and she was stable.  By postoperative day #2, her incisional VACS were removed and her incisions were clean, dry, and intact.  She had 2+ dorsalis pedal pulses on the right and 1+ dorsalis pedal pulses on the left.  She did have ABIs postoperatively. On the right it was 0.79 and on the left is 0.68.  Her postoperative course also included increasing ambulation as well as increasing intake of solids without difficulty.  She is discharged home on postoperative day #3.  DISCHARGE INSTRUCTIONS:  She is discharged home with extensive  instructions on wound care and progressive ambulation.  She is instructed not to drive or perform any heavy lifting until returning to see Dr. [name] in the office.  FOLLOWUP:  The patient is to follow up with Dr. [name] in 2 weeks.    ______________________________ Newton Pigg, PA   ______________________________ Di Kindle. [name], M.D.    SE/MEDQ  D:  05/12/2010  T:  05/12/2010  Job:  [id number]  cc:   Di Kindle. [name], M.D.  Electronically Signed by Waverly Ferrari M.D. on 05/17/2010 08:00:25 AM

## 2010-05-23 NOTE — Discharge Summary (Signed)
  [name], [name]                ACCOUNT NO.:  [id number]  MEDICAL RECORD NO.:  [id number]           PATIENT TYPE:  I  LOCATION:  2037                         FACILITY:  [facility]  PHYSICIAN:  Di Kindle. [name], M.D.DATE OF BIRTH:  [birth year]  DATE OF ADMISSION:  05/09/2010 DATE OF DISCHARGE:  05/12/2010                              DISCHARGE SUMMARY   ADDENDUM  DISCHARGE DIAGNOSES: 1. Disabling left lower extremity claudication with status post vein     patch angioplasty of the right common femoral artery as well as     right to left femoral-femoral bypass graft and placement of     incisional VAC on May 09, 2010. 2. Hypertension. 3. Obesity. 4. History of tubal ligation.  DISCHARGE MEDICATIONS: 1. Oxycodone 5 mg 1-2 p.o. q.6 hours p.r.n. pain. 2. Coreg 25 mg p.o. b.i.d. 3. Aspirin 325 mg p.o. daily. 4. Norvasc 5 mg p.o. daily. 5. Losartan/hydrochlorothiazide 100/25 mg 1 p.o. daily. 6. Prilosec 40 mg p.o. daily. 7. Levothyroxine 50 mcg p.o. daily. 8. Wellbutrin SR 150 mg p.o. daily. 9. Calcium with magnesium 600 mg p.o. b.i.d. 10.Vitamin D 2000 units 1 p.o. daily.    ______________________________ Newton Pigg, PA   ______________________________ Di Kindle. [name], M.D.    SE/MEDQ  D:  05/12/2010  T:  05/13/2010  Job:  [id number]  Electronically Signed by Waverly Ferrari M.D. on 05/23/2010 04:50:32 PM

## 2010-05-24 ENCOUNTER — Ambulatory Visit: Payer: Medicare Other | Admitting: Vascular Surgery

## 2010-05-24 NOTE — Discharge Summary (Signed)
NAMEJAKERRIA, [name]                ACCOUNT NO.:  [id number]  MEDICAL RECORD NO.:  [id number]           PATIENT TYPE:  I  LOCATION:  1402                         FACILITY[facility]  [facility]  PHYSICIAN:  Altha Harm, MDDATE OF BIRTH:  1940/06/23  DATE OF ADMISSION:  05/14/2010 DATE OF DISCHARGE:  05/18/2010                              DISCHARGE SUMMARY   DISCHARGE DISPOSITION:  Home.  DISCHARGE DIAGNOSES: 1. Acute exacerbation of chronic obstructive pulmonary disease,     improving. 2. Tracheobronchitis, improved. 3. Acute hypoxic respiratory failure, resolved. 4. Hypertension. 5. Peripheral arterial disease. 6. Hypothyroidism. 7. Oral candidiasis. 8. The patient is status post right and left fem-fem bypass on April     24.  DISCHARGE MEDICATIONS:  Discharge medications include the following: 1. DuoNeb 2 puffs inhaled q.6 h p.r.n. shortness of breath. 2. Avelox 400 mg p.o. daily at bedtime x3 days. 3. Nystatin swish and swallow 5 mL p.o. q.i.d. 4. Aspirin 325 mg p.o. daily. 5. Bupropion SR 150 mg p.o. daily. 6. Calcium with magnesium 600 mg p.o. b.i.d. 7. Coreg 25 mg p.o. b.i.d. 8. Levothyroxine 50 mcg p.o. daily. 9. Losartan/hydrochlorothiazide 10/25 mg p.o. daily. 10.Norvasc 5 mg p.o. daily. 11.Omeprazole 40 mg p.o. daily. 12.Oxycodone 5 to 10 mg p.o. q.6 h p.r.n. pain. 13.Vitamin D 2000 units p.o. daily.  CONSULTANTS:  Di Kindle. [name], M.D. Vascular Surgery.  PROCEDURES:  None.  DIAGNOSTIC STUDIES: 1. Two-view chest x-ray done on admission which shows stable     cardiomegaly and no active lung disease. 2. V/Q scan on May 15, 2010, which shows low probability for acute     PE. 3. Two-view chest x-ray on May 15, 2010, which shows mild     cardiomegaly and low lung volumes.  No acute findings. 4. Portable chest x-ray on May 15, 2010, post PICC line placement     which shows PICC line in the right upper extremity at the distal     tip of the distal  superior vena cava. 5. A 2-D echocardiogram done on May 15, 2010, which shows normal LV     size and function with an EF of 55-60%.  There is mildly increased     left ventricular hypertrophy.  No regional wall motion     abnormalities and left ventricular diastolic function parameters     are normal.  PRIMARY CARE PHYSICIAN:  Merlene Laughter. Renae Gloss, M.D.  CODE STATUS:  Full code.  ALLERGIES:  Allergies are to following: 1. AMOXICILLIN. 2. PENICILLIN. 3. PLAVIX. 4. CONTRAST MEDIA. 5. ADHESIVE.  CHIEF COMPLAINT:  Shortness of breath.  HISTORY OF PRESENT ILLNESS:  Please refer to the H and P by Dr. Mauro Kaufmann for details of the HPI.  However, in short, this is a 70 year old patient recently discharged from the hospital after she had a vein patch angioplasty in the right common femoral vessel who presents to the emergency room with shortness of breath.  HOSPITAL COURSE: 1. Acute exacerbation of COPD.  The patient presented with acute     hypoxic respiratory failure and shortness of breath.  She was     evaluated  for causes and was found to have an acute exacerbation of     her COPD with likely acute tracheobronchitis.  The patient was     treated with inhaled beta agonists and Spiriva.  She was also     started on IV steroids and then transitioned over to p.o. steroids     on a tapering dose.  The patient improved clinically with this     regimen of management.  She was also started on Avelox and     completed 4 of 7 days of Avelox.  She is to take 3 more days and     then complete her treatment. 2. Oral candidiasis.  The patient being on prednisone developed oral     candidiasis and was treated with nystatin swish and swallow.  The     patient is to take it 5 mL 4 times a day until oropharynx is clear     or up to 7 days. 3. Hypertension.  The patient was continued on her usual medications.     The patient had received some extra fluids here in the hospital and     was given  Lasix while hospitalized.  She is not being sent home on     Lasix and is to resume on her usual medications.  I would recommend     the patient has her electrolytes checked in 1 week to ensure that     she has no renal insufficiency related to volume contraction.  Otherwise the patient remained stable and is on her usual medications.  In terms of her recent right to left fem-fem bypass, the patient was seen in hospital by Dr. [name] who states that he will arrange for followup duplex in 3 weeks in the office.  DISCHARGE PHYSICAL EXAMINATION:  GENERAL:  At the time of discharge, the patient is stable.  She is ambulating without any need for oxygen at this time and looks well. VITAL SIGNS:  Temperature is 97.5, heart rate 64, respiratory rate 18, O2 sats 95% on room air, blood pressure 115/63. HEENT EXAMINATION:  She is normocephalic, atraumatic.  Pupils are equally round and reactive to light and accommodation.  Extraocular movements are intact.  Oropharynx is moist.  The patient has white exudate on tongue characteristic of oral candidiasis.  Otherwise, there are no erythema or lesions noted. NECK EXAMINATION:  Trachea is midline.  No masses, no thyromegaly, no JVD, no carotid bruit. RESPIRATORY EXAMINATION:  The patient has a normal respiratory effort, equal excursion bilaterally.  No wheezing or rhonchi noted. CARDIOVASCULAR:  She has got normal S1 and S2.  No murmurs, rubs or gallops are noted.  PMI is nondisplaced.  No heaves or thrills on palpation. ABDOMEN:  Obese, soft, nontender, nondistended.  No masses, no hepatosplenomegaly noted.  DIETARY RESTRICTIONS:  The patient should be on a heart-healthy diet.  PHYSICAL RESTRICTIONS:  None.  FOLLOWUP:  The patient is to follow up with Dr. Andi Devon in her office in 1 week and have her electrolytes checked at that time.  She is to follow up with Dr. [name] in the office in 2 weeks and he will make arrangements for her  to have duplex done in followup in his office.  Total time for this discharge process including face-to-face time, 32 minutes.     Altha Harm, MD     MAM/MEDQ  D:  05/18/2010  T:  05/18/2010  Job:  [id number]  cc:   Merlene Laughter. Renae Gloss,  M.D. Fax: [phone]  Di Kindle. [name], M.D. [address] [place] [address]  Electronically Signed by Marthann Schiller MD on 05/24/2010 02:21:13 PM

## 2010-05-30 NOTE — Discharge Summary (Signed)
[name], [name]                ACCOUNT NO.:  [id number]   MEDICAL RECORD NO.:  [id number]          PATIENT TYPE:  INP   LOCATION:  2506                         FACILITY:  [facility]   PHYSICIAN:  Nanetta Batty, M.D.   DATE OF BIRTH:  27-Jun-1940   DATE OF ADMISSION:  09/14/2008  DATE OF DISCHARGE:  09/15/2008                               DISCHARGE SUMMARY   DISCHARGE DIAGNOSES:  1. Claudication with abnormal arterial Dopplers.  2. Positive CT angio with occluded left iliac and calcified right      iliac disease.  3. Percutaneous transluminal angioplasty and stent to the proximal      right internal carotid artery.      a.     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  4. Abnormal nuclear stress test.      a.     Normal coronary arteries and normal left ventricular       function.  5. Hypothyroidism by TSH, will need further evaluation by primary      care.  6. Tobacco abuse with tobacco consult for cessation in the hospital.  7. Hypertension, controlled.  8. Idioventricular rhythm x11 beats, irregular, questionable      paroxysmal atrial fibrillation.   DISCHARGE CONDITION:  Improved and stable.   PROCEDURES:  1. On September 14, 2008, combined left heart catheterization by Dr.      Nanetta Batty.  2. September 14, 2008, Percutaneous transluminal angioplasty and stent      deployment to the right internal carotid artery by Dr. Allyson Sabal with      plans for staged intervention in the future.   DISCHARGE MEDICATIONS:  See medication reconciliation sheet in computer  system.   DISCHARGE INSTRUCTIONS:  1. Increase activity slowly.  2. May shower.  3. No lifting for 1 week.  4. No driving for 2 days.  5. Low-sodium heart-healthy diet.  6. Have lab work done on September 22, 2008, this is to check her      thyroid eval.  7. Wash cath site with soap and water.  Call if any bleeding,      swelling, or drainage.  8. Follow up with Dr. Allyson Sabal on September 29, 2008 at 11:30 a.m.  9. You are scheduled for lower extremity arterial Dopplers at      [facility] on September 27, 2008 at 4 p.m.   The patient ambulated in the hall prior to discharge without problems.  Blood pressure at discharge 101/76, pulse 83.   PHYSICAL EXAMINATION:  HEART:  Regular rate and rhythm.  LUNGS:  Clear.  Cath site was stable without hematoma.   LABORATORY DATA:  Hemoglobin at discharge 13.9, hematocrit 40.7, WBC  8.8, platelets 198.  Chemistry; sodium 139, potassium 4.8, chloride 102,  CO2 30, BUN 12, creatinine 0.92, glucose 113.   Coags; protime 12.1, INR is 0.9, PTT 23.   UA was clear.  Calcium 9.3 and TSH was elevated at 7.043.  Further  evaluation at discharge  as outpatient.  Chest x-ray was done prior to  cath and no active disease was seen.  The patient will follow up with  Dr. Allyson Sabal as well as Dr. Andi Devon.      [name]. Annie Paras, N.P.      Nanetta Batty, M.D.  Electronically Signed    LRI/MEDQ  D:  09/15/2008  T:  09/16/2008  Job:  [id number]   cc:   Merlene Laughter. Renae Gloss, M.D.

## 2010-05-30 NOTE — Op Note (Signed)
[name], [name]                ACCOUNT NO.:  [id number]   MEDICAL RECORD NO.:  [id number]          PATIENT TYPE:  INP   LOCATION:  2506                         FACILITY:  [facility]   PHYSICIAN:  Nanetta Batty, M.D.   DATE OF BIRTH:  Jun 01, 1940   DATE OF PROCEDURE:  09/14/2008  DATE OF DISCHARGE:                               OPERATIVE REPORT   HISTORY OF PRESENT ILLNESS:  Ms. Briney is a 70 year old mildly  overweight Caucasian female referred to me for cardiovascular evaluation  because of claudication.  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  She underwent cardiac catheterization today via right femoral  approach essentially revealing normal coronary arteries and normal LV  function.  She presents now for abdominal aortography, bifemoral runoff,  and potential endovascular therapy for functional limiting claudication.   DESCRIPTION OF PROCEDURE:  A 5-French sheath used for cardiac  catheterization was used for the procedure, which was inserted using  standard Seldinger technique in the right femoral artery.  A pigtail  catheter was used for abdominal aortography with bifemoral runoff using  bolus chase digital subtraction step table technique.  Visipaque dye was  used for the entirety of the case.  Retrograde aortic pressures  monitored in the case.   ANGIOGRAPHIC RESULTS:  1. Abdominal aorta:      a.     Renal arteries - normal.      b.     Infrarenal abdominal aorta - tapered.  There were       nonobstructive narrowing towards the bifurcation.  2. Left lower extremity:      a.     Occluded left common iliac artery at the ostium with       reconstitution several centimeters distal to this with two-vessel       runoff.  3. Right lower extremity:      a.     Subtotaled occlusion of the proximal right common iliac       artery with a three-vessel runoff.   DESCRIPTION OF PROCEDURE:  The  patient received 3000 units of heparin  intravenously.  A 5-French sheath was exchanged over wire for a 6-French  bright tip 3-cm long Cordis sheath.  A PTA was performed with a 5.2  upgrading to a 6.3 balloon.  An abdominal aortography in the LAO oblique  confirmed the post PTA result.  Because of heavy calcification and  mismatch between the diseased segment and the post stenotic dilated  iliac, a nitinol self-expanding stent was chosen.  A Cordis 10 x 4 stent  was then deployed under precise angiographic and fluoroscopic control at  the origin of the right common iliac into the __________ dilated area,  and dilated with a 7.3 balloon resulting in reduction of a 99% stenosis  to less than 20% residual.  There was excellent stent apposition and  slightly aneurysmal, but otherwise disease-free segment.  The patient  tolerated the procedure well.  ACT was measured.  The  sheath was removed.  Pressure was applied on the groin to achieve  hemostasis.  The patient left the lab in stable condition.  She will be  treated with aspirin and Plavix, hydrated overnight and discharged home  in the morning.  Sedation will be given to staged PTA, and stenting of  her occluded left common iliac artery.      Nanetta Batty, M.D.  Electronically Signed     JB/MEDQ  D:  09/14/2008  T:  09/14/2008  Job:  [id number]   cc:   Merlene Laughter. Renae Gloss, M.D.  [facility]  Second Floor PV Angiographic Suite

## 2010-05-30 NOTE — Cardiovascular Report (Signed)
NAMELOUETTA, HOLLINGSHEAD                ACCOUNT NO.:  [id number]   MEDICAL RECORD NO.:  [id number]          PATIENT TYPE:  INP   LOCATION:  2506                         FACILITY:  [facility]   PHYSICIAN:  Nanetta Batty, M.D.   DATE OF BIRTH:  1940-07-19   DATE OF PROCEDURE:  DATE OF DISCHARGE:                            CARDIAC CATHETERIZATION   [name] is a 70 year old mildly overweight divorced Caucasian female,  mother of 3, grandmother of 4 grandchildren, who was initially referred  to me by Dr. Renae Gloss for a cardiovascular evaluation for claudication.  Her risk factors include hypertension and tobacco abuse.  She is a  retired Child psychotherapist.  Dopplers in our office appeared to indicate Brittney  Wiggins with an occluded distal abdominal aorta and ABIs were 0.5  range.  Echo was normal and Myoview showed subtle anterior ischemia.  She does complain of dyspnea.  CT angio showed an occluded left iliac  with calcified right iliac, though patent disease.  She presents now for  cardiac catheterization, abdominal aortography with runoff.   DESCRIPTION OF PROCEDURE:  The patient was brought to the second floor  [facility] in postabsorptive state.  She was  premedicated with p.o. Valium, IV versed, and fentanyl.  Her right groin  was prepped and shaved in usual sterile fashion.  Xylocaine 1% was used  for local anesthesia.  A 5-French sheath was inserted into the right  femoral artery using standard Seldinger technique.  5-French right and  left Judkins diagnostic catheters as well as 5-French pigtail catheter  were used for selective coronary angiography, a left ventriculography,  and subselective left internal mammary artery angiography.  Visipaque  dye was used for the entirety of the case.  Retrograde aortic,  ventricular pullback pressures were recorded.   HEMODYNAMICS:  1. Aortic systolic pressure 165, diastolic pressure 63.  2. Left ventricular systolic pressure 164 and  diastolic pressure 70.  3. Selective coronary angiography.      a.     Left main normal.      b.     LAD normal.      c.     Left circumflex normal.      d.     Right coronary artery is dominant, normal.  4. Left ventriculography; RAO left ventriculogram was performed using      20 mL of Visipaque dye at 10 mL per second.  The overall LVEF was      estimated greater than 60% without focal wall motion abnormalities.  5. Left internal mammary artery; this vessel was subselectively      visualized and was widely patent.  It is suitable for use during      coronary artery bypass grafting if necessary.   IMPRESSION:  [name] has essentially normal coronary arteries and  normal left ventricular function.  I think her dyspnea is multifactorial  and her mild anterior ischemia is probably breast attenuation artifact.  Continued medical therapy will be recommended.  Following this, the  peripheral procedure ensued.      Nanetta Batty, M.D.  Electronically Signed  JB/MEDQ  D:  09/14/2008  T:  09/14/2008  Job:  [id number]   cc:   Cardiac Catheterization Laboratory  [facility]  [place]. Renae Gloss, M.D.

## 2010-06-07 ENCOUNTER — Ambulatory Visit (INDEPENDENT_AMBULATORY_CARE_PROVIDER_SITE_OTHER): Payer: PRIVATE HEALTH INSURANCE | Admitting: Vascular Surgery

## 2010-06-07 ENCOUNTER — Encounter (INDEPENDENT_AMBULATORY_CARE_PROVIDER_SITE_OTHER): Payer: PRIVATE HEALTH INSURANCE

## 2010-06-07 DIAGNOSIS — Z48812 Encounter for surgical aftercare following surgery on the circulatory system: Secondary | ICD-10-CM

## 2010-06-07 DIAGNOSIS — I739 Peripheral vascular disease, unspecified: Secondary | ICD-10-CM

## 2010-06-07 DIAGNOSIS — I70219 Atherosclerosis of native arteries of extremities with intermittent claudication, unspecified extremity: Secondary | ICD-10-CM

## 2010-06-08 NOTE — Assessment & Plan Note (Signed)
OFFICE VISIT  [name], [name] DOB:  [birth year]                                       06/07/2010 HQION#:[id number]  I saw patient in the office today for follow-up after her recent right- to-left fem-fem bypass graft.  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  She had previous PTA and stenting of the proximal right common iliac artery by Dr. Allyson Sabal. She had disabling claudication on the left and wished to pursue revascularization.  She underwent a vein patch angioplasty of the right common femoral artery and right to left fem-fem bypass grafting with an 8-mm PTFE graft on 05/08/10.  She was discharged on 05/12/10. Postoperative ABI was 79% on the right and 68% on the left.  She came in for an outpatient visit.  She overall has been doing quite well and has no significant claudication in either leg.  She has had no rest pain. She has had no chest pain or chest pressure.  PHYSICAL EXAMINATION:  Temperature is 97.8, blood pressure 147/75, heart rate is 71.  Her incisions are healed nicely without any significant erythema, although she does have some swelling in her left groin consistent with lymphocele.  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  She has palpable posterior tibial pulses bilaterally.  I have written her a prescription for Keflex just to help prevent the lymphocele from becoming infected.  I would like to avoid evacuating the lymphocele, as this is associated with a slight increased risk of developing an infection, and hopefully this will improve on its own.  If not, we could consider exploring the left groin, evacuating the lymphocele, and placing a VAC on the left groin.  I will see her back in 2 weeks.  She knows to call sooner if she has problems.    Di Kindle. [name], [name]. Electronically Signed  CSD/MEDQ  Wiggins:  06/07/2010  T:   06/08/2010  Job:  [id number]  cc:   Nanetta Batty, [name].

## 2010-06-14 NOTE — Procedures (Unsigned)
VASCULAR LAB EXAM  INDICATION:  Follow-up from a right angioplasty of a right common femoral artery and a right to left fem-fem bypass graft with a history of a right AVF.  HISTORY: Diabetes:  No. Cardiac:  No. Hypertension:  Yes.  EXAM:  IMPRESSION:  No evidence of a pseudoaneurysm or arteriovenous fistula within the right groin area.  The common femoral vein, common femoral artery, and proximal anastomoses possessed normal waveforms and compressibility.  Questionable hematoma versus seroma within the right groin area at least 14-17 cm with internal echoes.  A questionable left hematoma versus seroma within the left groin area measuring 6.9 x 5.3 x 4.7 cm.  The right greater saphenous vein at the saphenofemoral junction not visualized, probably due to the hematoma versus the seroma within the groin area.  The right greater saphenous vein appears thrombosed within the proximal thigh but is compressible and with normal flow, augmentation, and compressible just past the proximal thigh to the distal thigh.  I discussed these findings with the Waverly Ferrari, M.D.  ___________________________________________ Di Kindle. [name], M.D.  OD/MEDQ  D:  06/07/2010  T:  06/07/2010  Job:  [id number]

## 2010-06-14 NOTE — H&P (Signed)
[name], [name]                ACCOUNT NO.:  [id number]  MEDICAL RECORD NO.:  [id number]           PATIENT TYPE:  E  LOCATION:  WLED                         FACILITY:  [facility]  PHYSICIAN:  Mauro Kaufmann, MD         DATE OF BIRTH:  04-01-1940  DATE OF ADMISSION:  05/14/2010 DATE OF DISCHARGE:                             HISTORY & PHYSICAL   PRIMARY CARE PHYSICIAN:  Merlene Laughter. Renae Gloss, MD  CHIEF COMPLAINT:  Shortness of breath.  HISTORY OF PRESENT ILLNESS:  A 70 year old female, who was recently discharged from the hospital after the patient had a vein patch angioplasty of the right common femoral artery and right-to-left fem-fem bypass graft done on May 09, 2010.  The patient was discharged on May 12, 2010, and after discharge the patient said that she developed shortness of breath.  The patient was seen in the ER last night and was given breathing treatments and was sent home, but the patient said that she was okay for few hours and after that she could not lie still in the bed and she had to get up.  Also she noticed that there was lower extremity swelling, which was not present before.  The patient stayed in the hospital for 3 days, during which time she also received IV fluids for the surgery.  The patient has longstanding history of hypertension, though she denies any history of congestive heart failure in the past. The patient also is a long time smoker.  She has smoked for 45 years, but quit smoking in January 2012.  There is no history of COPD as per the patient.  She says that she does not use any inhalers at home.  PAST MEDICAL HISTORY: 1. Peripheral arterial disease. 2. Asthma. 3. Hypertension.  PAST SURGICAL HISTORY:  The patient had iliac artery graft from bypass on May 09, 2010, as above.  SOCIAL HISTORY:  The patient denies any tobacco abuse.  No alcohol abuse.  No history of illicit drug abuse.  FAMILY HISTORY:  Noncontributory.  ALLERGIES:  The  patient is allergic to, 1. AMOXICILLIN. 2. PENICILLIN. 3. PLAVIX. 4. ADHESIVE. 5. IODINE. 6. IVP DYE (IODINE CONTAINING).  HOME MEDICATIONS:  Currently the patient is on include, 1. Aspirin 325 mg p.o. daily. 2. Bupropion SR 150 mg p.o. daily. 3. Calcium with magnesium 600 mg twice a day. 4. Carvedilol 25 mg twice a day. 5. Oxycodone 5 mg IR as needed. 6. Levothyroxine 50 mcg once a day. 7. Losartan and hydrochlorothiazide 100/25 once a day. 8. Vitamin D 2000 units daily. 9. Norvasc 5 mg once a day. 10.Omeprazole 40 mg p.o. daily.  PHYSICAL EXAMINATION:  VITAL SIGNS:  The patient's blood pressure is 163/84, temperature is 98.2, pulse 84, respirations 20. HEENT:  Head is atraumatic, normocephalic.  Eyes, extraocular motions intact.  Oral mucosa is pink and moist. NECK:  Supple. CHEST:  Bilateral wheezing noted throughout the lung fields. HEART:  S1 and S2 is regular in rate and rhythm. ABDOMEN:  Soft, nontender.  No organomegaly. EXTREMITIES:  Bilateral 1+ edema noted in both lower extremities.  IMAGING:  Pertinent  imaging studies; chest x-ray shows stable cardiomegaly and no active lung disease.  LABORATORY DATA:  Pertinent labs show WBC 8.1, hemoglobin 12.1, hematocrit is 36.4, platelet count 235.  Sodium 143, potassium 4.4, chloride is 105, CO2 of 30, BUN 19, creatinine 1.16, glucose is 138. BNP 115.  Four set of cardiac enzymes are negative.  REVIEW OF SYSTEMS:  HEENT:  There is no headache.  No blurred vision. NECK:  There is positive history of hypothyroidism.  CHEST:  Positive history of asthma.  No history of COPD.  HEART:  No history of CAD.  GI: No nausea, vomiting, or diarrhea.  GENITOURINARY:  No dysuria, urgency, or frequency of urination.  NEUROLOGIC:  No history of stroke or TIA in the past.  No history of seizures.  CARDIOVASCULAR:  The patient admits to having history of orthopnea and PND.  ASSESSMENT/PLAN: 1. Dyspnea, pulmonary edema versus chronic  obstructive pulmonary     disease exacerbation. 2. Hypertension. 3. Peripheral arterial disease with recent fem-fem bypass.  PLAN: 1. Dyspnea.  The patient's dyspnea could be either pulmonary edema     versus COPD exacerbation.  I am going to obtain a 2-D     echocardiogram and also start the patient on IV Lasix.  We will     measure the strict I's and O's.  Though the patient's BNP is not     very high, but she still has diastolic dysfunction because of     longstanding history of hypertension.  Given the fact that the     patient was also in the hospital for 3 days and received IV fluids,     which may have triggered the patient's acute pulmonary edema.  The     patient also has a history of longstanding history of tobacco     abuse, though there is no history of emphysema.  The patient did     not use any inhalers in the past, so we are also going to treat     this as a possible COPD exacerbation.  The patient will be started     on prednisone p.o. along with Avelox antibiotics.  I am also going     to start the patient on nebulizer treatment p.r.n.  If the patient     improves with Lasix and has good diuretics response, the prednisone     can be stopped as per discretion of the rounding physician.  I am     going to monitor the patient on telemetry and also obtain three     sets of cardiac enzymes. 2. Hypertension.  The patient will be continued on the Coreg and     Norvasc. 3. Hypothyroidism.  We will continue the patient on Synthroid. 4. Peripheral arterial disease.  We will continue the patient on     aspirin. 5. DVT prophylaxis with Lovenox.     Mauro Kaufmann, MD     GL/MEDQ  D:  05/14/2010  T:  05/14/2010  Job:  [id number]  Electronically Signed by Mauro Kaufmann  on 06/14/2010 03:36:19 PM

## 2010-06-21 ENCOUNTER — Ambulatory Visit (INDEPENDENT_AMBULATORY_CARE_PROVIDER_SITE_OTHER): Payer: PRIVATE HEALTH INSURANCE | Admitting: Vascular Surgery

## 2010-06-21 DIAGNOSIS — I70219 Atherosclerosis of native arteries of extremities with intermittent claudication, unspecified extremity: Secondary | ICD-10-CM

## 2010-06-22 NOTE — Assessment & Plan Note (Signed)
OFFICE VISIT  ZAIN, BINGMAN D DOB:  1940/09/13                                       06/21/2010 ZOXWR#:[id number]  I saw the patient in the office today for followup after her previous right-to-left fem-fem bypass graft.  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  The incisions were healing without evidence of erythema or infection.  I put her on Keflex, and she comes in for a followup visit.  She states that the swelling has improved significantly.  She has had no fever or chills.  On examination, blood pressure is 147/82, heart rate is 82.  Her incisions are healed nicely.  She has moderate swelling in the left groin which has improved.  Given that her lymphocele appears to be improving, I am trying to avoid exploration and the risk of infection associated with this.  I will continue her on Keflex, and I will see her back in 6 weeks.  She knows to call sooner if the swelling increases.    Di Kindle. [name], M.D. Electronically Signed  CSD/MEDQ  D:  06/21/2010  T:  06/22/2010  Job:  [id number]  cc:   Nanetta Batty, M.D.

## 2010-06-29 ENCOUNTER — Encounter: Payer: Self-pay | Admitting: Vascular Surgery

## 2010-08-02 ENCOUNTER — Ambulatory Visit (INDEPENDENT_AMBULATORY_CARE_PROVIDER_SITE_OTHER): Payer: PRIVATE HEALTH INSURANCE | Admitting: Vascular Surgery

## 2010-08-02 ENCOUNTER — Encounter: Payer: Self-pay | Admitting: Vascular Surgery

## 2010-08-02 VITALS — BP 147/83 | HR 76 | Temp 98.3°F | Ht 62.0 in | Wt 216.0 lb

## 2010-08-02 DIAGNOSIS — I7092 Chronic total occlusion of artery of the extremities: Secondary | ICD-10-CM

## 2010-08-02 NOTE — Progress Notes (Signed)
6 week f/u,   Postop right to left Fem-Fem BPG on 05-09-10.

## 2010-08-02 NOTE — Progress Notes (Signed)
Subjective:     Patient ID: [name], female   DOB: 20-Apr-1940, 70 y.o.   MRN: [id number]  HPI This patient was seen in followup today after a fem-fem bypass in April of 2012. She had developed a lymphocele in her left groin which I have been treating conservatively. She has been on po Keflex. Today, she denies any pain in the left groin. She denies any fever or chills.  Review of Systems     Objective:   Physical Exam  Cardiovascular: Normal rate and regular rhythm.   Pulses:      Femoral pulses are 1+ on the right side, and 1+ on the left side.      Dorsalis pedis pulses are 1+ on the right side, and 1+ on the left side.       Posterior tibial pulses are 1+ on the right side, and 1+ on the left side.   of note, her pulses are somewhat difficult to palpate because of her obesity. Her incisions in both groins had healed nicely. She currently has no significant swelling in either groin and no evidence of significant lymphocele.     Assessment:    Overall I am pleased with her progress. I plan on seeing her back in 6 months and I have ordered followup ABIs for that time. She knows to call sooner if she has problems.     Plan:    I will see her in followup in 6 weeks

## 2010-09-28 ENCOUNTER — Other Ambulatory Visit: Payer: Self-pay | Admitting: Endocrinology

## 2010-09-28 DIAGNOSIS — E049 Nontoxic goiter, unspecified: Secondary | ICD-10-CM

## 2010-10-02 ENCOUNTER — Ambulatory Visit
Admission: RE | Admit: 2010-10-02 | Discharge: 2010-10-02 | Disposition: A | Discharge status: Home / Self Care | Payer: PRIVATE HEALTH INSURANCE | Source: Ambulatory Visit | Attending: Family Medicine | Admitting: Family Medicine

## 2010-10-02 ENCOUNTER — Ambulatory Visit
Admission: RE | Admit: 2010-10-02 | Discharge: 2010-10-02 | Disposition: A | Discharge status: Home / Self Care | Payer: PRIVATE HEALTH INSURANCE | Source: Ambulatory Visit | Attending: Endocrinology | Admitting: Endocrinology

## 2010-10-02 ENCOUNTER — Other Ambulatory Visit: Payer: Self-pay | Admitting: Family Medicine

## 2010-10-02 DIAGNOSIS — M25559 Pain in unspecified hip: Secondary | ICD-10-CM

## 2010-10-02 DIAGNOSIS — E049 Nontoxic goiter, unspecified: Secondary | ICD-10-CM

## 2010-11-28 ENCOUNTER — Encounter (HOSPITAL_COMMUNITY): Payer: Self-pay | Admitting: Pharmacy Technician

## 2010-12-05 ENCOUNTER — Other Ambulatory Visit: Payer: Self-pay | Admitting: Cardiovascular Disease

## 2010-12-12 ENCOUNTER — Encounter (HOSPITAL_COMMUNITY)
Admission: RE | Disposition: A | Discharge status: Home / Self Care | Payer: Self-pay | Source: Ambulatory Visit | Attending: Cardiovascular Disease

## 2010-12-12 ENCOUNTER — Other Ambulatory Visit: Payer: Self-pay

## 2010-12-12 ENCOUNTER — Encounter (HOSPITAL_COMMUNITY): Payer: Self-pay | Admitting: General Practice

## 2010-12-12 ENCOUNTER — Ambulatory Visit (HOSPITAL_COMMUNITY)
Admission: RE | Admit: 2010-12-12 | Discharge: 2010-12-13 | Disposition: A | Discharge status: Home / Self Care | Payer: PRIVATE HEALTH INSURANCE | Source: Ambulatory Visit | Attending: Cardiovascular Disease | Admitting: Cardiovascular Disease

## 2010-12-12 DIAGNOSIS — I480 Paroxysmal atrial fibrillation: Secondary | ICD-10-CM | POA: Diagnosis not present

## 2010-12-12 DIAGNOSIS — J4489 Other specified chronic obstructive pulmonary disease: Secondary | ICD-10-CM | POA: Insufficient documentation

## 2010-12-12 DIAGNOSIS — E669 Obesity, unspecified: Secondary | ICD-10-CM | POA: Diagnosis present

## 2010-12-12 DIAGNOSIS — D472 Monoclonal gammopathy: Secondary | ICD-10-CM | POA: Insufficient documentation

## 2010-12-12 DIAGNOSIS — Z91041 Radiographic dye allergy status: Secondary | ICD-10-CM | POA: Diagnosis present

## 2010-12-12 DIAGNOSIS — I10 Essential (primary) hypertension: Secondary | ICD-10-CM | POA: Insufficient documentation

## 2010-12-12 DIAGNOSIS — E119 Type 2 diabetes mellitus without complications: Secondary | ICD-10-CM | POA: Diagnosis present

## 2010-12-12 DIAGNOSIS — I70219 Atherosclerosis of native arteries of extremities with intermittent claudication, unspecified extremity: Secondary | ICD-10-CM | POA: Insufficient documentation

## 2010-12-12 DIAGNOSIS — J449 Chronic obstructive pulmonary disease, unspecified: Secondary | ICD-10-CM | POA: Insufficient documentation

## 2010-12-12 DIAGNOSIS — G4730 Sleep apnea, unspecified: Secondary | ICD-10-CM | POA: Insufficient documentation

## 2010-12-12 DIAGNOSIS — I7389 Other specified peripheral vascular diseases: Secondary | ICD-10-CM | POA: Diagnosis present

## 2010-12-12 HISTORY — PX: PERCUTANEOUS STENT INTERVENTION: SHX5500

## 2010-12-12 HISTORY — DX: Thyrotoxicosis, unspecified without thyrotoxic crisis or storm: E05.90

## 2010-12-12 HISTORY — DX: Shortness of breath: R06.02

## 2010-12-12 HISTORY — DX: Unspecified osteoarthritis, unspecified site: M19.90

## 2010-12-12 HISTORY — PX: PERCUTANEOUS STENT INTERVENTION: SHX6019

## 2010-12-12 HISTORY — DX: Gastro-esophageal reflux disease without esophagitis: K21.9

## 2010-12-12 HISTORY — PX: ABDOMINAL ANGIOGRAM: SHX5499

## 2010-12-12 LAB — GLUCOSE, CAPILLARY: Glucose-Capillary: 141 mg/dL — ABNORMAL HIGH (ref 70–99)

## 2010-12-12 LAB — POCT ACTIVATED CLOTTING TIME
Activated Clotting Time: 188 seconds
Activated Clotting Time: 182 seconds
Activated Clotting Time: 210 seconds
Activated Clotting Time: 166 seconds

## 2010-12-12 SURGERY — ABDOMINAL ANGIOGRAM
Anesthesia: LOCAL | Laterality: Right

## 2010-12-12 MED ORDER — SODIUM CHLORIDE 0.9 % IV SOLN
250.0000 mL | INTRAVENOUS | Status: DC
  Administered 2010-12-12: 10:00:00 via INTRAVENOUS

## 2010-12-12 MED ORDER — INSULIN ASPART 100 UNIT/ML ~~LOC~~ SOLN
0.0000 [IU] | Freq: Three times a day (TID) | SUBCUTANEOUS | Status: DC
  Administered 2010-12-12: 11 [IU] via SUBCUTANEOUS
  Administered 2010-12-13: 3 [IU] via SUBCUTANEOUS

## 2010-12-12 MED ORDER — SODIUM CHLORIDE 0.9 % IJ SOLN: 3.0000 mL | Freq: Two times a day (BID) | INTRAMUSCULAR | Status: DC

## 2010-12-12 MED ORDER — DIPHENHYDRAMINE HCL 50 MG/ML IJ SOLN
25.0000 mg | INTRAMUSCULAR | Status: AC
  Administered 2010-12-12: 25 mg via INTRAVENOUS
  Filled 2010-12-12: qty 1

## 2010-12-12 MED ORDER — MIDAZOLAM HCL 2 MG/2ML IJ SOLN
INTRAMUSCULAR | Status: AC
  Filled 2010-12-12: qty 2

## 2010-12-12 MED ORDER — SODIUM CHLORIDE 0.9 % IV SOLN: INTRAVENOUS | Status: DC

## 2010-12-12 MED ORDER — NITROGLYCERIN 0.2 MG/ML ON CALL CATH LAB
INTRAVENOUS | Status: AC
  Filled 2010-12-12: qty 1

## 2010-12-12 MED ORDER — ACETAMINOPHEN 325 MG PO TABS
650.0000 mg | ORAL_TABLET | ORAL | Status: DC | PRN
  Filled 2010-12-12: qty 2

## 2010-12-12 MED ORDER — SODIUM CHLORIDE 0.9 % IJ SOLN: 3.0000 mL | INTRAMUSCULAR | Status: DC | PRN

## 2010-12-12 MED ORDER — ACETAMINOPHEN 80 MG/0.8ML PO SUSP
650.0000 mg | ORAL | Status: DC | PRN
  Filled 2010-12-12: qty 15

## 2010-12-12 MED ORDER — FAMOTIDINE IN NACL 20-0.9 MG/50ML-% IV SOLN
20.0000 mg | INTRAVENOUS | Status: AC
  Administered 2010-12-12: 20 mg via INTRAVENOUS
  Filled 2010-12-12: qty 50

## 2010-12-12 MED ORDER — FENTANYL CITRATE 0.05 MG/ML IJ SOLN
INTRAMUSCULAR | Status: AC
  Filled 2010-12-12: qty 2

## 2010-12-12 MED ORDER — INSULIN ASPART 100 UNIT/ML ~~LOC~~ SOLN
0.0000 [IU] | Freq: Three times a day (TID) | SUBCUTANEOUS | Status: DC
  Filled 2010-12-12: qty 3

## 2010-12-12 MED ORDER — METHYLPREDNISOLONE SODIUM SUCC 125 MG IJ SOLR
60.0000 mg | INTRAMUSCULAR | Status: AC
  Administered 2010-12-12: 60 mg via INTRAVENOUS
  Filled 2010-12-12: qty 2

## 2010-12-12 MED ORDER — DIAZEPAM 5 MG PO TABS
5.0000 mg | ORAL_TABLET | ORAL | Status: AC
  Administered 2010-12-12: 5 mg via ORAL
  Filled 2010-12-12: qty 1

## 2010-12-12 MED ORDER — HEPARIN (PORCINE) IN NACL 2-0.9 UNIT/ML-% IJ SOLN
INTRAMUSCULAR | Status: AC
  Filled 2010-12-12: qty 1000

## 2010-12-12 MED ORDER — ONDANSETRON HCL 4 MG/2ML IJ SOLN: 4.0000 mg | Freq: Four times a day (QID) | INTRAMUSCULAR | Status: DC | PRN

## 2010-12-12 MED ORDER — INFLUENZA VIRUS VACC SPLIT PF IM SUSP
0.5000 mL | INTRAMUSCULAR | Status: DC
  Filled 2010-12-12 (×2): qty 0.5

## 2010-12-12 MED ORDER — SODIUM CHLORIDE 0.9 % IV SOLN
INTRAVENOUS | Status: AC
  Administered 2010-12-12: 14:00:00 via INTRAVENOUS

## 2010-12-12 MED ORDER — LIDOCAINE HCL (PF) 1 % IJ SOLN
INTRAMUSCULAR | Status: AC
  Filled 2010-12-12: qty 30

## 2010-12-12 MED ORDER — HEPARIN SODIUM (PORCINE) 1000 UNIT/ML IJ SOLN
INTRAMUSCULAR | Status: AC
  Filled 2010-12-12: qty 1

## 2010-12-12 NOTE — Op Note (Signed)
[name] is a 70 y.o. female    [id number] LOCATION:  FACILITY: [facility]  PHYSICIAN: Nanetta Batty, M.D. [birth year] and  DATE OF PROCEDURE:  12/12/2010  DATE OF DISCHARGE:  [facility]  CARDIAC CATHETERIZATION   History; the patient is a 70 year old divorced Caucasian female mother of 3, grandmother and 4 grandchildren who are last saw in the office on 03/23/2010 she should have normal coronary arteries by cath 09/14/2008 with high-grade aortoiliac disease and claudication. Risk factors include discontinued tobacco use, hypertension and hyperlipidemia. I entered into her initially on 09/14/2008 revealing a 99% ostial right common iliac artery stenosis which was stented with a 10 mm x 4 cm long Smart nitinol self-expanding stent. She had an occluded left common iliac artery. A brought her back October 4 and attempted to recanalize her left common iliac artery unsuccessfully. She ultimately underwent right to left fem-fem crossover grafting by Dr. Woodfin Ganja in May of this year which resulted in improvement in her symptoms but she recently developed recurrent symptoms now requiring a cane to walk. 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 This will have made her second stent should she was originally stented 6 months ago with an 8 x 18 mm long balloon expandable stent for in-stent restenosis. She presents now for repeat angiography and restenting using a iCAST covered stent.   PROCEDURE DESCRIPTION: the patient was brought to the second floor was a stone peripheral vascula rangiographic suite in the postabsorptive state. She was premedicated with by mouth Valium and IV Versed and fentanyl. Her right groin was prepped and shaved in the usual sterile fashion. 1% Xylocaine was used for local anesthesia. A 7 French bright tip sheath was  inserted into the right common femoral artery using standard Seldinger technique. A 5 French pigtail catheter was used along with an 035 for a score wire to perform abdominal aortography. Visipaque dye was used for the entirety of the case.  Retrograde aortic pressure was monitored during the case.      HEMODYNAMICS: aortic systolic pressure was 168 diastolic pressure was 65   *  ANGIOGRAPHIC RESULTS:   Abdominal aortography revealed a 50% hypodense proximal right common iliac artery stenosis within the previously placed stents. 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  Procedure description:  Patient received 8000 units of heparin intravenously with an ending ACT of 210. 79 cc of contrast was used for the entirety of the case. A pullback gradient was performed across the right common iliac artery using a 4 French short end hole catheter. 200 mcg of intra-articular closure was a minister through the Side Arm sheath.a pullback gradient was 30 mm of mercury. Stenting was performed with a 8 x 38 mm long ICAST covered stent.this is a postoperative dilator with a 9 x 3 balloon. Final angiographic result reduction of a 50% in-stent restenosis to 0% with excellent flow.     IMPRESSION:  The patient had a successful PTA and restent procedure of her restenosis within her previous and placed right common iliac artery stents. This was performed with a covered stent.the sheath will be removed once the ACT falls below 175 and pressure will be held on the groin to achieve hemostasis. The patient will be hydrated overnight and treated with aspirin. She is allergic Plavix. She will be discharged on the morning and we'll  get followup Dopplers in our office as an outpatient after which she will see me back in followup.  Runell Gess MD, [facility] 12/12/2010 11:54 AM

## 2010-12-12 NOTE — H&P (Signed)
Pt was reexamined and existing H & P reviewed. No changes found. Runell Gess 12/12/2010 10:55 AMPt was reexamined and existing H & P reviewed. No changes found. Pt was reexamined and existing H & P reviewed. No changes found.

## 2010-12-13 DIAGNOSIS — E119 Type 2 diabetes mellitus without complications: Secondary | ICD-10-CM | POA: Diagnosis present

## 2010-12-13 DIAGNOSIS — I480 Paroxysmal atrial fibrillation: Secondary | ICD-10-CM | POA: Diagnosis not present

## 2010-12-13 DIAGNOSIS — E669 Obesity, unspecified: Secondary | ICD-10-CM | POA: Diagnosis present

## 2010-12-13 DIAGNOSIS — Z91041 Radiographic dye allergy status: Secondary | ICD-10-CM | POA: Diagnosis present

## 2010-12-13 LAB — BASIC METABOLIC PANEL
Calcium: 9 mg/dL (ref 8.4–10.5)
GFR calc Af Amer: 75 mL/min — ABNORMAL LOW (ref >90)
GFR calc non Af Amer: 65 mL/min — ABNORMAL LOW (ref >90)
Potassium: 3.7 mEq/L (ref 3.5–5.1)
Sodium: 137 mEq/L (ref 135–145)

## 2010-12-13 LAB — GLUCOSE, CAPILLARY
Glucose-Capillary: 139 mg/dL — ABNORMAL HIGH (ref 70–99)
Glucose-Capillary: 110 mg/dL — ABNORMAL HIGH (ref 70–99)

## 2010-12-13 LAB — CBC
HCT: 35.9 % — ABNORMAL LOW (ref 36.0–46.0)
Hemoglobin: 12 g/dL (ref 12.0–15.0)
MCH: 28.2 pg (ref 26.0–34.0)
MCHC: 33.4 g/dL (ref 30.0–36.0)
MCV: 84.5 fL (ref 78.0–100.0)
Platelets: 252 10*3/uL (ref 150–400)
RBC: 4.25 MIL/uL (ref 3.87–5.11)
RDW: 14.8 % (ref 11.5–15.5)
WBC: 14 10*3/uL — ABNORMAL HIGH (ref 4.0–10.5)

## 2010-12-13 MED ORDER — CARVEDILOL 25 MG PO TABS
25.0000 mg | ORAL_TABLET | Freq: Two times a day (BID) | ORAL | Status: DC
Start: 2010-12-13 — End: 2010-12-13
  Administered 2010-12-13 (×2): 25 mg via ORAL
  Filled 2010-12-13 (×3): qty 1

## 2010-12-13 MED ORDER — AMLODIPINE BESYLATE 5 MG PO TABS
5.0000 mg | ORAL_TABLET | Freq: Every day | ORAL | Status: DC
  Administered 2010-12-13: 5 mg via ORAL
  Filled 2010-12-13: qty 1

## 2010-12-13 MED ORDER — LEVOTHYROXINE SODIUM 50 MCG PO TABS
50.0000 ug | ORAL_TABLET | Freq: Every day | ORAL | Status: DC
  Administered 2010-12-13: 50 ug via ORAL
  Filled 2010-12-13: qty 1

## 2010-12-13 MED ORDER — DILTIAZEM HCL ER 180 MG PO CP24: 180.0000 mg | ORAL_CAPSULE | Freq: Every day | ORAL | Status: DC

## 2010-12-13 MED ORDER — BUPROPION HCL ER (SR) 150 MG PO TB12
150.0000 mg | ORAL_TABLET | Freq: Every day | ORAL | Status: DC
  Administered 2010-12-13: 150 mg via ORAL
  Filled 2010-12-13: qty 1

## 2010-12-13 MED ORDER — ASPIRIN 81 MG PO CHEW: 81.0000 mg | CHEWABLE_TABLET | Freq: Every day | ORAL | Status: DC

## 2010-12-13 MED ORDER — VITAMIN D3 25 MCG (1000 UNIT) PO TABS
2000.0000 [IU] | ORAL_TABLET | Freq: Every day | ORAL | Status: DC
  Administered 2010-12-13: 2000 [IU] via ORAL
  Filled 2010-12-13: qty 2

## 2010-12-13 MED ORDER — LOSARTAN POTASSIUM 50 MG PO TABS
100.0000 mg | ORAL_TABLET | Freq: Every day | ORAL | Status: DC
  Administered 2010-12-13: 100 mg via ORAL
  Filled 2010-12-13: qty 2

## 2010-12-13 MED ORDER — DIPHENHYDRAMINE HCL 25 MG PO CAPS: 25.0000 mg | ORAL_CAPSULE | Freq: Four times a day (QID) | ORAL | Status: AC | PRN

## 2010-12-13 MED ORDER — LIVING WELL WITH DIABETES BOOK
Freq: Once | Status: AC
  Administered 2010-12-13: 16:00:00
  Filled 2010-12-13: qty 1

## 2010-12-13 MED ORDER — DIPHENHYDRAMINE HCL 25 MG PO CAPS
25.0000 mg | ORAL_CAPSULE | Freq: Four times a day (QID) | ORAL | Status: DC | PRN
  Administered 2010-12-13: 25 mg via ORAL
  Filled 2010-12-13: qty 1

## 2010-12-13 MED ORDER — HYDROCHLOROTHIAZIDE 25 MG PO TABS
25.0000 mg | ORAL_TABLET | Freq: Every day | ORAL | Status: DC
  Administered 2010-12-13: 25 mg via ORAL
  Filled 2010-12-13: qty 1

## 2010-12-13 MED ORDER — VITAMIN D 50 MCG (2000 UT) PO TABS: 2000.0000 [IU] | ORAL_TABLET | Freq: Every day | ORAL | Status: DC

## 2010-12-13 MED ORDER — DILTIAZEM HCL 180 MG PO CP24: 180.0000 mg | ORAL_CAPSULE | Freq: Every day | ORAL | Status: DC

## 2010-12-13 MED ORDER — ASPIRIN 81 MG PO CHEW
81.0000 mg | CHEWABLE_TABLET | Freq: Every day | ORAL | Status: DC
  Administered 2010-12-13: 81 mg via ORAL
  Filled 2010-12-13: qty 1

## 2010-12-13 MED ORDER — PANTOPRAZOLE SODIUM 40 MG PO TBEC
40.0000 mg | DELAYED_RELEASE_TABLET | Freq: Every day | ORAL | Status: DC
  Administered 2010-12-13: 40 mg via ORAL
  Filled 2010-12-13: qty 1

## 2010-12-13 MED ORDER — LOSARTAN POTASSIUM-HCTZ 100-25 MG PO TABS: 1.0000 | ORAL_TABLET | Freq: Every day | ORAL | Status: DC

## 2010-12-13 NOTE — Discharge Summary (Signed)
Patient ID: [name],  MRN: [id number], DOB/AGE: 06/22/1940 70 y.o.  Admit date: 12/12/2010 Discharge date: 12/13/2010  Primary Care Provider: Dr Kirtland Bouchard. [place] Primary Cardiologist: Dr Allyson Sabal  Discharge Diagnoses *PVD WITH CLAUDICATION, ISR RCIA Rx'd with PTA  RCIA PTA Aug 2010, F-F BPG April 2012  Diabetes, history of Metformin allergy PAF (paroxysmal atrial fibrillation), new for her  HTN  Asthmatic COPD, ex-smoker  IV contrast reaction, she did receive steroids pre PTA  Obesity, unspecified, suspected sleep apnea Nl cors Aug 2010    Procedures: PTA for ISR of RCIA  History of Present Illness: Pt with history of PAD, s/p RCIA stent Aug 2010. Pt brought back in Oct 2010 for attempt at PTA to total LCIA but this was unsuccessful. She ultimately required F-F BPG by Dr [name] in April 2012. As an OP she had increasing claudication with abnormal dopplers and she was admitted for PVA.   Hospital Course: Pt was premedicated with steroids and Benadryl for contrast media allergy. She had underwent successful PTA for RCIA ISR on 11/27. Post procedure she did have documented PAF with increased VR. This lasted about 30 minutes. We changed her Norvasc to Diltiazem at discharge. She also had elevated blood sugars during her stay. She has been referred to OP Diabetic teaching and instructed to follow up with her primary MD. We feel she can be d/c'd to home. She did require an extra Benadryl on the morning of discharge for some residual itching and rash, probably secondary to IV contrast.  Discharge Vitals:  Blood pressure 101/67, pulse 65, temperature 97.7 F (36.5 C), temperature source Oral, resp. rate 18, height 5\' 2"  (1.575 m), weight 99.3 kg (218 lb 14.7 oz), SpO2 100.00%.    Labs: Results for orders placed during the hospital encounter of 12/12/10 (from the past 48 hour(s))  GLUCOSE, CAPILLARY     Status: Abnormal   Collection Time   12/12/10  9:30 AM      Component Value Range  Comment   Glucose-Capillary 141 (*) 70 - 99 (mg/dL)   POCT ACTIVATED CLOTTING TIME     Status: Normal   Collection Time   12/12/10 11:29 AM      Component Value Range Comment   Activated Clotting Time 188     POCT ACTIVATED CLOTTING TIME     Status: Normal   Collection Time   12/12/10 11:43 AM      Component Value Range Comment   Activated Clotting Time 210     GLUCOSE, CAPILLARY     Status: Abnormal   Collection Time   12/12/10 12:12 PM      Component Value Range Comment   Glucose-Capillary 158 (*) 70 - 99 (mg/dL)   POCT ACTIVATED CLOTTING TIME     Status: Normal   Collection Time   12/12/10 12:18 PM      Component Value Range Comment   Activated Clotting Time 182     POCT ACTIVATED CLOTTING TIME     Status: Normal   Collection Time   12/12/10 12:45 PM      Component Value Range Comment   Activated Clotting Time 166     GLUCOSE, CAPILLARY     Status: Abnormal   Collection Time   12/12/10  1:43 PM      Component Value Range Comment   Glucose-Capillary 145 (*) 70 - 99 (mg/dL)   GLUCOSE, CAPILLARY     Status: Abnormal   Collection Time   12/12/10  4:54 PM  Component Value Range Comment   Glucose-Capillary 243 (*) 70 - 99 (mg/dL)   GLUCOSE, CAPILLARY     Status: Abnormal   Collection Time   12/12/10  6:57 PM      Component Value Range Comment   Glucose-Capillary 253 (*) 70 - 99 (mg/dL)   GLUCOSE, CAPILLARY     Status: Abnormal   Collection Time   12/12/10  9:55 PM      Component Value Range Comment   Glucose-Capillary 180 (*) 70 - 99 (mg/dL)    Comment 1 Documented in Chart      Comment 2 Notify RN     BASIC METABOLIC PANEL     Status: Abnormal   Collection Time   12/13/10  4:45 AM      Component Value Range Comment   Sodium 137  135 - 145 (mEq/L)    Potassium 3.7  3.5 - 5.1 (mEq/L)    Chloride 103  96 - 112 (mEq/L)    CO2 24  19 - 32 (mEq/L)    Glucose, Bld 182 (*) 70 - 99 (mg/dL)    BUN 18  6 - 23 (mg/dL)    Creatinine, Ser 1.47  0.50 - 1.10 (mg/dL)     Calcium 9.0  8.4 - 10.5 (mg/dL)    GFR calc non Af Amer 65 (*) >90 (mL/min)    GFR calc Af Amer 75 (*) >90 (mL/min)   CBC     Status: Abnormal   Collection Time   12/13/10  4:45 AM      Component Value Range Comment   WBC 14.0 (*) 4.0 - 10.5 (K/uL)    RBC 4.25  3.87 - 5.11 (MIL/uL)    Hemoglobin 12.0  12.0 - 15.0 (g/dL)    HCT 82.9 (*) 56.2 - 46.0 (%)    MCV 84.5  78.0 - 100.0 (fL)    MCH 28.2  26.0 - 34.0 (pg)    MCHC 33.4  30.0 - 36.0 (g/dL)    RDW 13.0  86.5 - 78.4 (%)    Platelets 252  150 - 400 (K/uL)   GLUCOSE, CAPILLARY     Status: Abnormal   Collection Time   12/13/10  7:50 AM      Component Value Range Comment   Glucose-Capillary 139 (*) 70 - 99 (mg/dL)   GLUCOSE, CAPILLARY     Status: Abnormal   Collection Time   12/13/10 12:11 PM      Component Value Range Comment   Glucose-Capillary 110 (*) 70 - 99 (mg/dL)     Disposition:  Follow-up Information    Follow up with Runell Gess, MD. (office will call)    Contact information:   [address] [place] [address] [phone]       Follow up with [facility] (call office)    Contact information:   [address] [place] [address] [phone]          Discharge Medications:  Current Discharge Medication List    START taking these medications   Details  diltiazem (DILACOR XR) 180 MG 24 hr capsule Take 1 capsule (180 mg total) by mouth daily. Qty: 30 capsule, Refills: 5    diphenhydrAMINE (BENADRYL) 25 mg capsule Take 1 capsule (25 mg total) by mouth every 6 (six) hours as needed for itching. Qty: 30 capsule, Refills: 0      CONTINUE these medications which have NOT CHANGED   Details  aspirin 81 MG chewable  tablet Chew 81 mg by mouth daily.      buPROPion (WELLBUTRIN SR) 150 MG 12 hr tablet Take 150 mg by mouth daily.      Calcium-Magnesium-Vitamin D (CALCIUM MAGNESIUM PO) Take by mouth 2 (two) times daily.      carvedilol  (COREG) 25 MG tablet Take 25 mg by mouth 2 (two) times daily with a meal.      Cholecalciferol (VITAMIN D) 2000 UNITS tablet Take 2,000 Units by mouth daily.      levothyroxine (SYNTHROID, LEVOTHROID) 50 MCG tablet Take 50 mcg by mouth daily.      losartan-hydrochlorothiazide (HYZAAR) 100-25 MG per tablet Take 1 tablet by mouth daily.      omeprazole (PRILOSEC) 40 MG capsule Take 40 mg by mouth daily.        STOP taking these medications     amLODipine (NORVASC) 5 MG tablet           Duration of Discharge Encounter: Greater than 45 minutes including physician time.  Jolene Provost PA-C 12/13/2010 2:37 PM

## 2010-12-13 NOTE — Progress Notes (Signed)
Spoke with patient about her new diagnosis of diabetes.  Has watched DM videos, given [facility] notes by staff nurse, DM booklet Living Well with Diabetes.  Given home blood glucose meter prescription signed by physician.  Talked with her about carbohydrate counting.  Has physician order for outpatient diabetes education.  Given phone number for the center.  Seems very interested in education.  To follow up with her primary care physician.

## 2010-12-13 NOTE — Progress Notes (Signed)
Subjective:  C/O itching and rash   Objective:  Vital Signs in the last 24 hours: Temp:  [97.7 F (36.5 C)-98.2 F (36.8 C)] 98.1 F (36.7 C) (11/28 0816) Pulse Rate:  [73-93] 78  (11/28 0816) Resp:  [14-22] 20  (11/28 0816) BP: (124-164)/(54-75) 164/63 mmHg (11/28 0816) SpO2:  [95 %-96 %] 95 % (11/28 0816) Weight:  [99.3 kg (218 lb 14.7 oz)] 218 lb 14.7 oz (99.3 kg) (11/28 0617)  Intake/Output from previous day: 11/27 0701 - 11/28 0700 In: 580 [P.O.:480; I.V.:100] Out: 500 [Urine:500]  Physical Exam: General appearance: alert, cooperative, no distress and moderately obese Lungs: clear to auscultation bilaterally and no wheezing Heart: regular rate and rhythm Skin: Skin color, texture, turgor normal. No rashes or lesions or fine red rash on trunk    Rate: 80  Rhythm: normal sinus rhythm, run of PAF last night   Lab Results:  Basename 12/13/10 0445  WBC 14.0*  HGB 12.0  PLT 252    Basename 12/13/10 0445  NA 137  K 3.7  CL 103  CO2 24  GLUCOSE 182*  BUN 18  CREATININE 0.89   No results found for this basename: TROPONINI:2,CK,MB:2 in the last 72 hours Hepatic Function Panel No results found for this basename: PROT,ALBUMIN,AST,ALT,ALKPHOS,BILITOT,BILIDIR,IBILI in the last 72 hours No results found for this basename: CHOL in the last 72 hours No results found for this basename: INR in the last 72 hours  Imaging: No results found.  Cardiac Studies:  Assessment/Plan:   Principal Problem:  *PVD WITH CLAUDICATION, ISR RCIA Rx'd with PTA Active Problems:  PAF (paroxysmal atrial fibrillation), new for her  HTN  Asthmatic COPD, ex-smoker  IV contrast reaction, she did receive steroids pre PTA  Obesity, unspecified  Nl cors Aug 2010  Plan- Patient is probably on as much beta blocker as she can tolerate with history of asthma. She appears to have a drug reaction this am despite IV steroids pre op. Will Rx with Benedryl, ambulate, consider changing Norvasc to  Cardizem. She may also need a sleep study after d/c.     Corine Shelter PA-C 12/13/2010, 9:28 AM  Patient seen and examined. Agree with assessment and plan.  Rash is improved post benadryl. Blood sugar elevations suggest diabetes mellitus, type 2.  Consider adding metformin 48 hrs post contrast at low initial dose, dietary evaluation and f/u with primary MD. Lennette Bihari  MD, [facility] 12/13/2010 2:01 PM

## 2010-12-13 NOTE — Consult Note (Signed)
Pt is a former smoker who quit on 1/12 and has remained tobacco free since. Congratulated and encouraged pt to remain tobacco free. Referred to 1-800-quit -now for f/u.

## 2010-12-20 ENCOUNTER — Other Ambulatory Visit (HOSPITAL_COMMUNITY): Payer: Self-pay | Admitting: Internal Medicine

## 2010-12-20 DIAGNOSIS — Z1231 Encounter for screening mammogram for malignant neoplasm of breast: Secondary | ICD-10-CM

## 2011-01-29 ENCOUNTER — Ambulatory Visit (HOSPITAL_COMMUNITY)
Admission: RE | Admit: 2011-01-29 | Discharge: 2011-01-29 | Disposition: A | Discharge status: Home / Self Care | Payer: PRIVATE HEALTH INSURANCE | Source: Ambulatory Visit | Attending: Internal Medicine | Admitting: Internal Medicine

## 2011-01-29 DIAGNOSIS — Z1231 Encounter for screening mammogram for malignant neoplasm of breast: Secondary | ICD-10-CM | POA: Insufficient documentation

## 2011-01-30 ENCOUNTER — Encounter: Payer: Self-pay | Admitting: Vascular Surgery

## 2011-01-31 ENCOUNTER — Ambulatory Visit (INDEPENDENT_AMBULATORY_CARE_PROVIDER_SITE_OTHER): Payer: PRIVATE HEALTH INSURANCE | Admitting: Vascular Surgery

## 2011-01-31 ENCOUNTER — Encounter: Payer: Self-pay | Admitting: Vascular Surgery

## 2011-01-31 ENCOUNTER — Encounter (INDEPENDENT_AMBULATORY_CARE_PROVIDER_SITE_OTHER): Payer: PRIVATE HEALTH INSURANCE | Admitting: *Deleted

## 2011-01-31 VITALS — BP 143/69 | HR 66 | Resp 16 | Ht 62.0 in | Wt 220.0 lb

## 2011-01-31 DIAGNOSIS — I739 Peripheral vascular disease, unspecified: Secondary | ICD-10-CM

## 2011-01-31 DIAGNOSIS — I70219 Atherosclerosis of native arteries of extremities with intermittent claudication, unspecified extremity: Secondary | ICD-10-CM

## 2011-01-31 DIAGNOSIS — Z48812 Encounter for surgical aftercare following surgery on the circulatory system: Secondary | ICD-10-CM

## 2011-01-31 NOTE — Progress Notes (Signed)
[facility] [place]  Patient name: [name] MRN: [id number] DOB: Apr 07, 1940 Sex: female  REASON FOR VISIT: follow up after right to left fem-fem bypass graft.  HPI: [name] is a 71 y.o. female who had PTA and stenting of a right common iliac artery stenosis by Dr. Nanetta Batty. On 05/08/2010, she underwent vein patch angioplasty of the right common femoral artery and a right to left fem-fem bypass graft with 8 mm PTFE graft. Postoperative ABI was 79% on the right and 68% on the left. She has some infrainguinal arterial occlusive disease on the left. Since I saw her last in July of 2012, she has developed some low back pain which came on gradually. She's also had some left calf claudication which occurs at less than a half a mile. Her symptoms have been stable. Symptoms are aggravated by activity and relieved somewhat with rest. She does state that she's gained significant weight and is now 220 pounds. In November Dr. Allyson Sabal performed angioplasty of the previously stented right common iliac artery. That time her graft was patent.  Past Medical History  Diagnosis Date  . COPD (chronic obstructive pulmonary disease)   . Hypertension   . PAD (peripheral artery disease)   . Hypothyroidism   . Hypercholesteremia   . Obesity   . Myocardial infarction   . CHF (congestive heart failure)   . Shortness of breath   . Hyperthyroidism   . GERD (gastroesophageal reflux disease)   . Arthritis   . Diabetes mellitus     Family History  Problem Relation Age of Onset  . Diabetes Mother   . Heart disease Mother   . Heart disease Sister     Heart attack  . Cancer Sister     SOCIAL HISTORY: History  Substance Use Topics  . Smoking status: Former Smoker -- 47 years    Types: Cigarettes    Quit date: 01/15/2010  . Smokeless tobacco: Not on file  . Alcohol Use: No    Allergies  Allergen Reactions  . Glucophage (Metformin Hydrochloride) Itching and Rash   REPORTED BY PATIENT; STATES SYMPTOMS APPEARED AFTER THIRD DOES WITHIN THE LAST MONTH.  Marland Kitchen Fosamax Other (See Comments)    Unknown   . Metformin And Related   . Omnipaque (Iohexol) Other (See Comments)    Unknown   . Adhesive (Tape) Rash  . Amoxicillin Itching and Rash  . Cephalexin Itching and Rash  . Contrast Media (Iodinated Diagnostic Agents) Itching and Rash  . Plavix (Clopidogrel Bisulfate) Itching and Rash    Current Outpatient Prescriptions  Medication Sig Dispense Refill  . aspirin 81 MG chewable tablet Chew 81 mg by mouth daily.        Marland Kitchen buPROPion (WELLBUTRIN SR) 150 MG 12 hr tablet Take 150 mg by mouth daily.        . Calcium-Magnesium-Vitamin D (CALCIUM MAGNESIUM PO) Take by mouth 2 (two) times daily.        . carvedilol (COREG) 25 MG tablet Take 25 mg by mouth 2 (two) times daily with a meal.        . Cholecalciferol (VITAMIN D) 2000 UNITS tablet Take 2,000 Units by mouth daily.        Marland Kitchen levothyroxine (SYNTHROID, LEVOTHROID) 50 MCG tablet Take 50 mcg by mouth daily.        Marland Kitchen losartan-hydrochlorothiazide (HYZAAR) 100-25 MG per tablet Take 1 tablet by mouth daily.        Marland Kitchen omeprazole (PRILOSEC)  40 MG capsule Take 40 mg by mouth daily.        . sitaGLIPtin (JANUVIA) 50 MG tablet Take 60 mg by mouth daily.      Marland Kitchen diltiazem (DILACOR XR) 180 MG 24 hr capsule Take 1 capsule (180 mg total) by mouth daily.  30 capsule  5    REVIEW OF SYSTEMS: [name] ] denotes positive finding; [  ] denotes negative finding CARDIOVASCULAR:  [ ]  chest pain   [ ]  chest pressure   [ ]  palpitations   [ ]  orthopnea   [ ]  dyspnea on exertion   [ ]  claudication   [ ]  rest pain   [ ]  DVT   [ ]  phlebitis PULMONARY:   [ ]  productive cough   [ ]  asthma   [ ]  wheezing NEUROLOGIC:   [ ]  weakness  [ ]  paresthesias  [ ]  aphasia  [ ]  amaurosis  [ ]  dizziness HEMATOLOGIC:   [ ]  bleeding problems   [ ]  clotting disorders MUSCULOSKELETAL:  [ ]  joint pain   [ ]  joint swelling [ ]  leg swelling GASTROINTESTINAL: [ ]    blood in stool  [ ]   hematemesis GENITOURINARY:  [ ]   dysuria  [ ]   hematuria PSYCHIATRIC:  [ ]  history of major depression INTEGUMENTARY:  [ ]  rashes  [ ]  ulcers CONSTITUTIONAL:  [ ]  fever   [ ]  chills  PHYSICAL EXAM: Filed Vitals:   01/31/11 1058  BP: 143/69  Pulse: 66  Resp: 16  Height: 5\' 2"  (1.575 m)  Weight: 220 lb (99.791 kg)  SpO2: 97%   Body mass index is 40.24 kg/(m^2). GENERAL: The patient is a well-nourished female, in no acute distress. The vital signs are documented above. CARDIOVASCULAR: There is a regular rate and rhythm without significant murmur appreciated. I do not detect carotid bruits. It is difficult to feel femoral pulses because of her size. PULMONARY: There is good air exchange bilaterally without wheezing or rales. ABDOMEN: Soft and non-tender with normal pitched bowel sounds.  MUSCULOSKELETAL: There are no major deformities or cyanosis. NEUROLOGIC: No focal weakness or paresthesias are detected. SKIN: There are no ulcers or rashes noted. PSYCHIATRIC: The patient has a normal affect.  DATA:  I reviewed the arteriogram from November which shows a fem-fem graft is patent. I have independently interpreted the Doppler study in our office today which is biphasic Doppler signals in the right foot with an ABI of 100% on the right. On the left she has monophasic Doppler signals in the dorsalis pedis and posterior tibial positions with an ABI of 61% which is stable.  MEDICAL ISSUES: I believe that her back pain is likely related to the disc disease. She does have stable claudication of the left calf and likely has infrainguinal arterial occlusive disease in the left. Fem-fem graft is patent. Fortunately she quit smoking one year ago. I've encouraged her to stay as active as possible. I'll plan on seeing her back in one year. I've ordered follow up ABIs in the duplex of her graft at that time. She knows to call sooner if she has problems.  Oneita Allmon  S [facility] [place] Beeper: [phone]

## 2011-02-27 ENCOUNTER — Emergency Department (HOSPITAL_COMMUNITY): Payer: PRIVATE HEALTH INSURANCE

## 2011-02-27 ENCOUNTER — Encounter (HOSPITAL_COMMUNITY): Payer: Self-pay

## 2011-02-27 ENCOUNTER — Other Ambulatory Visit: Payer: Self-pay

## 2011-02-27 ENCOUNTER — Inpatient Hospital Stay (HOSPITAL_COMMUNITY)
Admission: EM | Admit: 2011-02-27 | Discharge: 2011-03-01 | DRG: 192 | Disposition: A | Discharge status: Home / Self Care | Payer: PRIVATE HEALTH INSURANCE | Attending: Internal Medicine | Admitting: Internal Medicine

## 2011-02-27 DIAGNOSIS — M129 Arthropathy, unspecified: Secondary | ICD-10-CM | POA: Diagnosis present

## 2011-02-27 DIAGNOSIS — I480 Paroxysmal atrial fibrillation: Secondary | ICD-10-CM

## 2011-02-27 DIAGNOSIS — I739 Peripheral vascular disease, unspecified: Secondary | ICD-10-CM | POA: Diagnosis present

## 2011-02-27 DIAGNOSIS — J40 Bronchitis, not specified as acute or chronic: Secondary | ICD-10-CM | POA: Diagnosis present

## 2011-02-27 DIAGNOSIS — Z91041 Radiographic dye allergy status: Secondary | ICD-10-CM

## 2011-02-27 DIAGNOSIS — I10 Essential (primary) hypertension: Secondary | ICD-10-CM | POA: Diagnosis present

## 2011-02-27 DIAGNOSIS — K219 Gastro-esophageal reflux disease without esophagitis: Secondary | ICD-10-CM | POA: Diagnosis present

## 2011-02-27 DIAGNOSIS — L408 Other psoriasis: Secondary | ICD-10-CM

## 2011-02-27 DIAGNOSIS — E669 Obesity, unspecified: Secondary | ICD-10-CM

## 2011-02-27 DIAGNOSIS — E119 Type 2 diabetes mellitus without complications: Secondary | ICD-10-CM | POA: Diagnosis present

## 2011-02-27 DIAGNOSIS — J441 Chronic obstructive pulmonary disease with (acute) exacerbation: Secondary | ICD-10-CM | POA: Diagnosis present

## 2011-02-27 DIAGNOSIS — I7389 Other specified peripheral vascular diseases: Secondary | ICD-10-CM

## 2011-02-27 DIAGNOSIS — E7800 Pure hypercholesterolemia, unspecified: Secondary | ICD-10-CM | POA: Diagnosis present

## 2011-02-27 DIAGNOSIS — E039 Hypothyroidism, unspecified: Secondary | ICD-10-CM | POA: Diagnosis present

## 2011-02-27 DIAGNOSIS — J45909 Unspecified asthma, uncomplicated: Secondary | ICD-10-CM

## 2011-02-27 DIAGNOSIS — I4891 Unspecified atrial fibrillation: Secondary | ICD-10-CM | POA: Diagnosis present

## 2011-02-27 LAB — BASIC METABOLIC PANEL
CO2: 30 mEq/L (ref 19–32)
Glucose, Bld: 122 mg/dL — ABNORMAL HIGH (ref 70–99)
Potassium: 4.5 mEq/L (ref 3.5–5.1)
Sodium: 141 mEq/L (ref 135–145)

## 2011-02-27 LAB — DIFFERENTIAL
Lymphocytes Relative: 26 % (ref 12–46)
Lymphs Abs: 2.4 10*3/uL (ref 0.7–4.0)
Monocytes Relative: 7 % (ref 3–12)
Neutrophils Relative %: 54 % (ref 43–77)

## 2011-02-27 LAB — CBC
Hemoglobin: 12.8 g/dL (ref 12.0–15.0)
MCV: 84.7 fL (ref 78.0–100.0)
Platelets: 287 10*3/uL (ref 150–400)
RBC: 4.64 MIL/uL (ref 3.87–5.11)
WBC: 9.4 10*3/uL (ref 4.0–10.5)

## 2011-02-27 LAB — PRO B NATRIURETIC PEPTIDE: Pro B Natriuretic peptide (BNP): 33.9 pg/mL (ref 0–125)

## 2011-02-27 MED ORDER — SODIUM CHLORIDE 0.9 % IJ SOLN
3.0000 mL | Freq: Two times a day (BID) | INTRAMUSCULAR | Status: DC
  Administered 2011-02-27 – 2011-02-28 (×2): 3 mL via INTRAVENOUS

## 2011-02-27 MED ORDER — CARVEDILOL 6.25 MG PO TABS
6.2500 mg | ORAL_TABLET | Freq: Two times a day (BID) | ORAL | Status: DC
  Administered 2011-02-27 – 2011-02-28 (×2): 6.25 mg via ORAL
  Filled 2011-02-27 (×3): qty 1

## 2011-02-27 MED ORDER — VITAMIN D3 25 MCG (1000 UNIT) PO TABS
2000.0000 [IU] | ORAL_TABLET | Freq: Every day | ORAL | Status: DC
  Administered 2011-02-27 – 2011-03-01 (×3): 2000 [IU] via ORAL
  Filled 2011-02-27 (×3): qty 2

## 2011-02-27 MED ORDER — IPRATROPIUM BROMIDE 0.02 % IN SOLN
0.5000 mg | Freq: Once | RESPIRATORY_TRACT | Status: AC
  Administered 2011-02-27: 0.5 mg via RESPIRATORY_TRACT
  Filled 2011-02-27: qty 2.5

## 2011-02-27 MED ORDER — LOSARTAN POTASSIUM 50 MG PO TABS
100.0000 mg | ORAL_TABLET | Freq: Every day | ORAL | Status: DC
  Administered 2011-02-27 – 2011-03-01 (×3): 100 mg via ORAL
  Filled 2011-02-27 (×3): qty 2

## 2011-02-27 MED ORDER — ONDANSETRON HCL 4 MG PO TABS: 4.0000 mg | ORAL_TABLET | Freq: Four times a day (QID) | ORAL | Status: DC | PRN

## 2011-02-27 MED ORDER — PANTOPRAZOLE SODIUM 40 MG PO TBEC
40.0000 mg | DELAYED_RELEASE_TABLET | Freq: Every day | ORAL | Status: DC
  Administered 2011-02-27 – 2011-03-01 (×3): 40 mg via ORAL
  Filled 2011-02-27 (×3): qty 1

## 2011-02-27 MED ORDER — ALBUTEROL (5 MG/ML) CONTINUOUS INHALATION SOLN
10.0000 mg/h | INHALATION_SOLUTION | Freq: Once | RESPIRATORY_TRACT | Status: AC
  Administered 2011-02-27: 10 mg/h via RESPIRATORY_TRACT

## 2011-02-27 MED ORDER — INSULIN ASPART 100 UNIT/ML ~~LOC~~ SOLN
0.0000 [IU] | Freq: Three times a day (TID) | SUBCUTANEOUS | Status: DC
  Administered 2011-02-28 (×2): 2 [IU] via SUBCUTANEOUS
  Administered 2011-02-28: 8 [IU] via SUBCUTANEOUS
  Administered 2011-03-01: 2 [IU] via SUBCUTANEOUS
  Administered 2011-03-01: 3 [IU] via SUBCUTANEOUS
  Filled 2011-02-27: qty 3

## 2011-02-27 MED ORDER — SODIUM CHLORIDE 0.9 % IJ SOLN
3.0000 mL | Freq: Two times a day (BID) | INTRAMUSCULAR | Status: DC
  Administered 2011-02-27 – 2011-03-01 (×4): 3 mL via INTRAVENOUS

## 2011-02-27 MED ORDER — ACETAMINOPHEN 650 MG RE SUPP: 650.0000 mg | Freq: Four times a day (QID) | RECTAL | Status: DC | PRN

## 2011-02-27 MED ORDER — METHYLPREDNISOLONE SODIUM SUCC 40 MG IJ SOLR
40.0000 mg | Freq: Three times a day (TID) | INTRAMUSCULAR | Status: DC
  Administered 2011-02-27 – 2011-03-01 (×5): 40 mg via INTRAVENOUS
  Filled 2011-02-27 (×8): qty 1

## 2011-02-27 MED ORDER — ALBUTEROL SULFATE (5 MG/ML) 0.5% IN NEBU
2.5000 mg | INHALATION_SOLUTION | RESPIRATORY_TRACT | Status: DC
  Administered 2011-02-27 (×2): 2.5 mg via RESPIRATORY_TRACT
  Filled 2011-02-27 (×2): qty 0.5

## 2011-02-27 MED ORDER — SODIUM CHLORIDE 0.9 % IV SOLN
INTRAVENOUS | Status: AC
  Administered 2011-02-27: 14:00:00 via INTRAVENOUS

## 2011-02-27 MED ORDER — INSULIN ASPART 100 UNIT/ML ~~LOC~~ SOLN: 0.0000 [IU] | Freq: Every day | SUBCUTANEOUS | Status: DC

## 2011-02-27 MED ORDER — SODIUM CHLORIDE 0.9 % IV SOLN: 250.0000 mL | INTRAVENOUS | Status: DC | PRN

## 2011-02-27 MED ORDER — HYDROCHLOROTHIAZIDE 25 MG PO TABS
25.0000 mg | ORAL_TABLET | Freq: Every day | ORAL | Status: DC
  Administered 2011-02-27 – 2011-03-01 (×3): 25 mg via ORAL
  Filled 2011-02-27 (×3): qty 1

## 2011-02-27 MED ORDER — SODIUM CHLORIDE 0.9 % IV BOLUS (SEPSIS)
500.0000 mL | Freq: Once | INTRAVENOUS | Status: AC
  Administered 2011-02-27: 500 mL via INTRAVENOUS

## 2011-02-27 MED ORDER — DM-GUAIFENESIN ER 30-600 MG PO TB12
1.0000 | ORAL_TABLET | Freq: Two times a day (BID) | ORAL | Status: DC
  Administered 2011-02-27 – 2011-02-28 (×2): 1 via ORAL
  Filled 2011-02-27 (×3): qty 1

## 2011-02-27 MED ORDER — ONDANSETRON HCL 4 MG/2ML IJ SOLN: 4.0000 mg | Freq: Four times a day (QID) | INTRAMUSCULAR | Status: DC | PRN

## 2011-02-27 MED ORDER — ACETAMINOPHEN 325 MG PO TABS
650.0000 mg | ORAL_TABLET | Freq: Four times a day (QID) | ORAL | Status: DC | PRN
  Administered 2011-02-28: 650 mg via ORAL
  Filled 2011-02-27: qty 2

## 2011-02-27 MED ORDER — ENOXAPARIN SODIUM 40 MG/0.4ML ~~LOC~~ SOLN
40.0000 mg | SUBCUTANEOUS | Status: DC
  Administered 2011-02-27 – 2011-02-28 (×2): 40 mg via SUBCUTANEOUS
  Filled 2011-02-27 (×3): qty 0.4

## 2011-02-27 MED ORDER — LEVALBUTEROL HCL 0.63 MG/3ML IN NEBU
0.6300 mg | INHALATION_SOLUTION | Freq: Four times a day (QID) | RESPIRATORY_TRACT | Status: DC
  Administered 2011-02-27 – 2011-03-01 (×7): 0.63 mg via RESPIRATORY_TRACT
  Filled 2011-02-27 (×11): qty 3

## 2011-02-27 MED ORDER — BISACODYL 5 MG PO TBEC: 10.0000 mg | DELAYED_RELEASE_TABLET | Freq: Every day | ORAL | Status: DC | PRN

## 2011-02-27 MED ORDER — METHYLPREDNISOLONE SODIUM SUCC 125 MG IJ SOLR
125.0000 mg | Freq: Once | INTRAMUSCULAR | Status: AC
  Administered 2011-02-27: 125 mg via INTRAVENOUS
  Filled 2011-02-27: qty 2

## 2011-02-27 MED ORDER — MOXIFLOXACIN HCL IN NACL 400 MG/250ML IV SOLN
400.0000 mg | Freq: Once | INTRAVENOUS | Status: AC
Start: 2011-02-27 — End: 2011-02-27
  Administered 2011-02-27: 400 mg via INTRAVENOUS
  Filled 2011-02-27: qty 250

## 2011-02-27 MED ORDER — LINAGLIPTIN 5 MG PO TABS
5.0000 mg | ORAL_TABLET | Freq: Every day | ORAL | Status: DC
  Administered 2011-02-27 – 2011-03-01 (×3): 5 mg via ORAL
  Filled 2011-02-27 (×3): qty 1

## 2011-02-27 MED ORDER — LOSARTAN POTASSIUM-HCTZ 100-25 MG PO TABS: 1.0000 | ORAL_TABLET | Freq: Every day | ORAL | Status: DC

## 2011-02-27 MED ORDER — ASPIRIN 81 MG PO CHEW
81.0000 mg | CHEWABLE_TABLET | Freq: Every day | ORAL | Status: DC
  Administered 2011-02-27 – 2011-03-01 (×3): 81 mg via ORAL
  Filled 2011-02-27 (×3): qty 1

## 2011-02-27 MED ORDER — BUPROPION HCL ER (SR) 150 MG PO TB12
150.0000 mg | ORAL_TABLET | Freq: Every day | ORAL | Status: DC
  Administered 2011-02-27 – 2011-03-01 (×3): 150 mg via ORAL
  Filled 2011-02-27 (×3): qty 1

## 2011-02-27 MED ORDER — SODIUM CHLORIDE 0.9 % IJ SOLN: 3.0000 mL | INTRAMUSCULAR | Status: DC | PRN

## 2011-02-27 MED ORDER — NON FORMULARY: 100.0000 mg | Freq: Every day | Status: DC

## 2011-02-27 MED ORDER — ROFLUMILAST 500 MCG PO TABS
500.0000 ug | ORAL_TABLET | Freq: Every day | ORAL | Status: DC
  Administered 2011-02-27 – 2011-03-01 (×3): 500 ug via ORAL
  Filled 2011-02-27 (×3): qty 1

## 2011-02-27 MED ORDER — MOXIFLOXACIN HCL IN NACL 400 MG/250ML IV SOLN
400.0000 mg | INTRAVENOUS | Status: DC
  Administered 2011-02-28 – 2011-03-01 (×2): 400 mg via INTRAVENOUS
  Filled 2011-02-27 (×2): qty 250

## 2011-02-27 MED ORDER — LEVALBUTEROL HCL 0.63 MG/3ML IN NEBU
0.6300 mg | INHALATION_SOLUTION | RESPIRATORY_TRACT | Status: DC | PRN
  Filled 2011-02-27: qty 3

## 2011-02-27 MED ORDER — GUAIFENESIN-DM 100-10 MG/5ML PO SYRP
5.0000 mL | ORAL_SOLUTION | ORAL | Status: DC | PRN
  Administered 2011-02-28: 5 mL via ORAL
  Filled 2011-02-27: qty 10

## 2011-02-27 MED ORDER — ALBUTEROL SULFATE (5 MG/ML) 0.5% IN NEBU
INHALATION_SOLUTION | RESPIRATORY_TRACT | Status: AC
  Filled 2011-02-27: qty 2

## 2011-02-27 MED ORDER — LEVOTHYROXINE SODIUM 75 MCG PO TABS
75.0000 ug | ORAL_TABLET | Freq: Every day | ORAL | Status: DC
  Administered 2011-02-28 – 2011-03-01 (×2): 75 ug via ORAL
  Filled 2011-02-27 (×2): qty 1

## 2011-02-27 NOTE — Progress Notes (Signed)
PCP:   MABE,DAVID, NP, NP   Chief Complaint:  Worsening shortness of breath  HPI: The patient is a pleasant 71 year old white female with past medical history significant for COPD, paroxysmal atrial fibrillation who presents with above complaints. She states that she was in her usual state of health until about 1-1/2-2 weeks ago when she developed shortness of breath and a cough that progressively worsened. The cough has been mostly productive of" "milky" thick sputum, but became nonproductive today. She denies fevers, also denies chest pain. She short of breath with minimal exertion. She denies peripheral edema, PND. She was seen in the ED and a chest x-ray was done and showed findings consistent with bronchitis. Per ED physician she received 2 continuous treatments of nebulized bronchodilators but has symptoms were persisting and so she is admitted for further evaluation and management. A brain natruretic peptide was done and came back normal at 33.9, point-of-care markers were negative, EKG showed normal sinus rhythm with no acute ischemic changes..  Review of Systems:  The patient denies anorexia, fever, weight loss,, vision loss, decreased hearing, hoarseness, chest pain, syncope, peripheral edema, balance deficits, hemoptysis, abdominal pain, melena, hematochezia, severe indigestion/heartburn, hematuria, incontinence, muscle weakness, transient blindness, difficulty walking, depression, unusual weight change.  Past Medical History: Past Medical History  Diagnosis Date  . COPD (chronic obstructive pulmonary disease)   . Hypertension   . PAD (peripheral artery disease)   . Hypothyroidism   . Hypercholesteremia   . Obesity   . Shortness of breath   . Hyperthyroidism   . GERD (gastroesophageal reflux disease)   . Arthritis   . Diabetes mellitus   . Paroxysmal atrial f fibrillation Past Surgical History  Procedure Date  . Femoral-femoral bypass graft 05/09/2010    Right to Left Fem-Fem  BPG by Dr. [name]  . Tubal ligation   . Descending aortic aneurysm repair w/ stent 12/12/10    Stent repair    Medications: Prior to Admission medications   Medication Sig Start Date End Date Taking? Authorizing Provider  albuterol-ipratropium (COMBIVENT) 18-103 MCG/ACT inhaler Inhale 2 puffs into the lungs every 6 (six) hours as needed. Wheezing shortness of breath   Yes Historical Provider, MD  aspirin 81 MG chewable tablet Chew 81 mg by mouth daily.     Yes Historical Provider, MD  buPROPion (WELLBUTRIN SR) 150 MG 12 hr tablet Take 150 mg by mouth daily.     Yes Historical Provider, MD  Calcium-Magnesium-Vitamin D (CALCIUM MAGNESIUM PO) Take by mouth 2 (two) times daily.     Yes Historical Provider, MD  carvedilol (COREG) 6.25 MG tablet Take 6.25 mg by mouth 2 (two) times daily with a meal.   Yes Historical Provider, MD  Cholecalciferol (VITAMIN D) 2000 UNITS tablet Take 2,000 Units by mouth daily.    Yes Historical Provider, MD  levothyroxine (SYNTHROID, LEVOTHROID) 75 MCG tablet Take 75 mcg by mouth daily.   Yes Historical Provider, MD  losartan-hydrochlorothiazide (HYZAAR) 100-25 MG per tablet Take 1 tablet by mouth daily.     Yes Historical Provider, MD  omeprazole (PRILOSEC) 40 MG capsule Take 40 mg by mouth daily.     Yes Historical Provider, MD  roflumilast (DALIRESP) 500 MCG TABS tablet Take 500 mcg by mouth daily.   Yes Historical Provider, MD  sitaGLIPtin (JANUVIA) 100 MG tablet Take 100 mg by mouth daily.   Yes Historical Provider, MD    Allergies:   Allergies  Allergen Reactions  . Glucophage (Metformin Hydrochloride) Itching and Rash  REPORTED BY PATIENT; STATES SYMPTOMS APPEARED AFTER THIRD DOES WITHIN THE LAST MONTH.  Marland Kitchen Fosamax Other (See Comments)    Unknown   . Metformin And Related   . Omnipaque (Iohexol) Other (See Comments)    Unknown   . Adhesive (Tape) Rash  . Amoxicillin Itching and Rash  . Cephalexin Itching and Rash  . Contrast Media (Iodinated  Diagnostic Agents) Itching and Rash  . Plavix (Clopidogrel Bisulfate) Itching and Rash    Social History:  reports that she quit smoking about 13 months ago. Her smoking use included Cigarettes. She quit after 47 years of use. She has never used smokeless tobacco. She reports that she does not drink alcohol or use illicit drugs.  Family History: Family History  Problem Relation Age of Onset  . Diabetes Mother   . Heart disease Mother   . Heart disease Sister     Heart attack  . Cancer Sister     Physical Exam: Filed Vitals:   02/27/11 1330 02/27/11 1529 02/27/11 1615 02/27/11 1859  BP: 160/83  139/66 149/75  Pulse: 108  108 106  Temp:    97.7 F (36.5 C)  TempSrc:    Oral  Resp: 24  17 18   Height:    5\' 2"  (1.575 m)  Weight:    94.802 kg (209 lb)  SpO2: 96% 96% 97% 93%   Constitutional: Vital signs reviewed, obese in no acute distress, no accessory muscle use, and cooperative with exam. Alert and oriented x3.  Head: Normocephalic and atraumatic Mouth: no erythema or exudates, MMM Eyes: PERRL, EOMI, conjunctivae normal, No scleral icterus.  Neck: Supple, Trachea midline normal ROM, No JVD, mass, thyromegaly, or carotid bruit present.  Cardiovascular: RRR, S1 normal, S2 normal, no MRG, pulses symmetric and intact bilaterally Pulmonary/Chest: Scattered wheezes bilaterally, no rales, or rhonchi Abdominal: Soft. Mild epigastric tenderness, non-distended, bowel sounds are normal, no masses, organomegaly, or guarding present.  Extremities: No cyanosis or edema.   Neurological: A&O x3, Strenght is normal and symmetric bilaterally, cranial nerve II-XII are grossly intact, no focal motor deficit, sensory intact to light touch bilaterally.  Skin: Warm, dry and intact. No rash Psychiatric: Normal mood and affect.  Labs on Admission:   [facility] 02/27/11 0733  NA 141  K 4.5  CL 101  CO2 30  GLUCOSE 122*  BUN 24*  CREATININE 1.26*  CALCIUM 10.2  MG --  PHOS --   No results  found for this basename: AST:2,ALT:2,ALKPHOS:2,BILITOT:2,PROT:2,ALBUMIN:2 in the last 72 hours No results found for this basename: LIPASE:2,AMYLASE:2 in the last 72 hours  Basename 02/27/11 0733  WBC 9.4  NEUTROABS 5.0  HGB 12.8  HCT 39.3  MCV 84.7  PLT 287    Basename 02/27/11 0733  CKTOTAL --  CKMB --  CKMBINDEX --  TROPONINI <0.30   No results found for this basename: TSH,T4TOTAL,FREET3,T3FREE,THYROIDAB in the last 72 hours No results found for this basename: VITAMINB12:2,FOLATE:2,FERRITIN:2,TIBC:2,IRON:2,RETICCTPCT:2 in the last 72 hours  Radiological Exams on Admission: Dg Chest 2 View  02/27/2011  *RADIOLOGY REPORT*  Clinical Data: Shortness of breath, COPD, hypertension, diabetes and X smoker.  CHEST - 2 VIEW  Comparison: Chest x-ray 05/15/2010.  Findings: Lung volumes are normal.  No consolidative airspace disease.  Mild diffuse interstitial prominence with evidence of central airway thickening.  Pulmonary vasculature is normal.  Heart size is upper limits of normal.  Mediastinal contours are unremarkable.  Atherosclerosis of the thoracic aorta.  IMPRESSION:  1.  Mild diffuse interstitial prominence and  thickening of the central airways.  This may suggest a mild bronchitis. 2.  Extensive atherosclerosis of the thoracic aorta.  Original Report Authenticated By: Florencia Reasons, M.D.   Mm Digital Screening  01/30/2011  DG SCREEN MAMMOGRAM BILATERAL Bilateral CC and MLO view(s) were taken.  DIGITAL SCREENING MAMMOGRAM WITH CAD: The breast tissue is almost entirely fatty.  No masses or malignant type calcifications are  identified.  Compared with prior studies.  Images were processed with CAD.  IMPRESSION: No specific mammographic evidence of malignancy.  Next screening mammogram is recommended in one  year.  A result letter of this screening mammogram will be mailed directly to the patient.  ASSESSMENT: Negative - BI-RADS 1  Screening mammogram in 1 year. ,     Assessment/Plan Present on Admission:  .COPD exacerbation -As discussed above, chest x-ray on admission with no acute infiltrates. Will place on antibiotics, nebulized bronchodilators mucolytics and IV steroids.follow and further manage accordingly pending evolution of clinical course. She quit smoking about a year and a half ago!  .Diabetes mellitus -Will continue Januvia, monitor Accu-Cheks and cover with sliding scale insulin-given she is on steroids above monitor closely and adjust meds as appropriate, also taper her steroids as quickly as clinically appropriate. Paroxysmal atrial fibrillation -She is in sinus rhythm at this time, monitor on telemetry continue outpatient medications-Coreg and aspirin. Hypertension -Continue outpatient medications. Kilie Rund C 02/27/2011, 7:20 PM

## 2011-02-27 NOTE — ED Provider Notes (Signed)
History     CSN: [id number]  Arrival date & time 02/27/11  0604   First MD Initiated Contact with Patient 02/27/11 [phone]      Chief Complaint  Patient presents with  . Shortness of Breath  . Cough    (Consider location/radiation/quality/duration/timing/severity/associated sxs/prior treatment) HPI  70yoF h/o COPD, hypertension, ?CHF (noted in medical record, pt denies), NIDDM since with shortness of breath. Patient states she's been experiencing shortness of breath for the past week and a half to 2 weeks. She states that she's been wheezing 1 usual home as well. She states it worsened yesterday and she's been using her albuterol inhaler more than 4 times a day along with daily Symbicort. She complains of increased productive cough with white phlegm. SHe denies fevers, chills. She denies leg swelling, orthopnea, paroxysmal nocturnal dyspnea. She denies chest pain or discomfort. There is no back pain. She denies nausea, vomiting, diaphoresis. There no sick contacts. States that this does feel like a COPD exacerbation. Denies h/o VTE in self or family. No recent hosp/surg/immob. No h/o cancer. Denies exogenous hormone use, no leg pain or swelling.  ED Notes, ED Provider Notes from 02/27/11 0000 to 02/27/11 06:09:49       Courtney Heys, RN 02/27/2011 06:09      Pt complains of being short of breath and a cough for two weeks, has been seen before for the same     Past Medical History  Diagnosis Date  . COPD (chronic obstructive pulmonary disease)   . Hypertension   . PAD (peripheral artery disease)   . Hypothyroidism   . Hypercholesteremia   . Obesity   . Myocardial infarction   . CHF (congestive heart failure)   . Shortness of breath   . Hyperthyroidism   . GERD (gastroesophageal reflux disease)   . Arthritis   . Diabetes mellitus     Past Surgical History  Procedure Date  . Femoral-femoral bypass graft 05/09/2010    Right to Left Fem-Fem BPG by Dr. [name]  . Tubal  ligation   . Descending aortic aneurysm repair w/ stent 12/12/10    Stent repair    Family History  Problem Relation Age of Onset  . Diabetes Mother   . Heart disease Mother   . Heart disease Sister     Heart attack  . Cancer Sister     History  Substance Use Topics  . Smoking status: Former Smoker -- 47 years    Types: Cigarettes    Quit date: 01/15/2010  . Smokeless tobacco: Not on file  . Alcohol Use: No    OB History    Grav Para Term Preterm Abortions TAB SAB Ect Mult Living                  Review of Systems  All other systems reviewed and are negative.  except as noted HPI   Allergies  Glucophage; Fosamax; Metformin and related; Omnipaque; Adhesive; Amoxicillin; Cephalexin; Contrast media; and Plavix  Home Medications   Current Outpatient Rx  Name Route Sig Dispense Refill  . IPRATROPIUM-ALBUTEROL 18-103 MCG/ACT IN AERO Inhalation Inhale 2 puffs into the lungs every 6 (six) hours as needed. Wheezing shortness of breath    . ASPIRIN 81 MG PO CHEW Oral Chew 81 mg by mouth daily.      . BUPROPION HCL ER (SR) 150 MG PO TB12 Oral Take 150 mg by mouth daily.      Marland Kitchen CALCIUM MAGNESIUM PO Oral Take by  mouth 2 (two) times daily.      Marland Kitchen CARVEDILOL 6.25 MG PO TABS Oral Take 6.25 mg by mouth 2 (two) times daily with a meal.    . VITAMIN D 2000 UNITS PO TABS Oral Take 2,000 Units by mouth daily.     Marland Kitchen LEVOTHYROXINE SODIUM 75 MCG PO TABS Oral Take 75 mcg by mouth daily.    Marland Kitchen LOSARTAN POTASSIUM-HCTZ 100-25 MG PO TABS Oral Take 1 tablet by mouth daily.      Marland Kitchen OMEPRAZOLE 40 MG PO CPDR Oral Take 40 mg by mouth daily.      . ROFLUMILAST 500 MCG PO TABS Oral Take 500 mcg by mouth daily.    Marland Kitchen SITAGLIPTIN PHOSPHATE 100 MG PO TABS Oral Take 100 mg by mouth daily.      BP 160/79  Pulse 88  Temp(Src) 98.9 F (37.2 C) (Oral)  Resp 19  SpO2 94%  Physical Exam  Nursing note and vitals reviewed. Constitutional: She is oriented to person, place, and time. She appears  well-developed.  HENT:  Head: Atraumatic.  Mouth/Throat: Oropharynx is clear and moist.  Eyes: Conjunctivae and EOM are normal. Pupils are equal, round, and reactive to light.  Neck: Normal range of motion. Neck supple. No JVD present.  Cardiovascular: Normal rate, regular rhythm, normal heart sounds and intact distal pulses.   Pulmonary/Chest: No respiratory distress. She has wheezes. She has no rales.       She is able to speak in complete sentences. She is a prolonged expiratory phase. She has diffuse expiratory wheeze  Abdominal: Soft. She exhibits no distension. There is tenderness. There is no rebound and no guarding.  Musculoskeletal: Normal range of motion. She exhibits no edema.  Neurological: She is alert and oriented to person, place, and time.  Skin: Skin is warm and dry. No rash noted.  Psychiatric: She has a normal mood and affect.    Date: 02/27/2011  Rate: 79  Rhythm: normal sinus rhythm  QRS Axis: normal  Intervals: normal  ST/T Wave abnormalities: normal  Conduction Disutrbances:none  Narrative Interpretation:   Old EKG Reviewed: unchanged   ED Course  Procedures (including critical care time)  Labs Reviewed  DIFFERENTIAL - Abnormal; Notable for the following:    Eosinophils Relative 12 (*)    Eosinophils Absolute 1.2 (*)    All other components within normal limits  BASIC METABOLIC PANEL - Abnormal; Notable for the following:    Glucose, Bld 122 (*)    BUN 24 (*)    Creatinine, Ser 1.26 (*)    GFR calc non Af Amer 43 (*)    GFR calc Af Amer 50 (*)    All other components within normal limits  CBC  TROPONIN I  PRO B NATRIURETIC PEPTIDE   Dg Chest 2 View  02/27/2011  *RADIOLOGY REPORT*  Clinical Data: Shortness of breath, COPD, hypertension, diabetes and X smoker.  CHEST - 2 VIEW  Comparison: Chest x-ray 05/15/2010.  Findings: Lung volumes are normal.  No consolidative airspace disease.  Mild diffuse interstitial prominence with evidence of central  airway thickening.  Pulmonary vasculature is normal.  Heart size is upper limits of normal.  Mediastinal contours are unremarkable.  Atherosclerosis of the thoracic aorta.  IMPRESSION:  1.  Mild diffuse interstitial prominence and thickening of the central airways.  This may suggest a mild bronchitis. 2.  Extensive atherosclerosis of the thoracic aorta.  Original Report Authenticated By: Florencia Reasons, M.D.    1. COPD exacerbation  MDM  H/O COPD, CHF pw shortness of breath, wheezing. Likely 2/2 COPD exacerbation. CXR, solumedrol, albuterol neb, cont ordered. Labs, EKG. Reassess.  0830 patient states that she is feeling better. She continues to have diffuse expiratory wheezing. Her chest x-ray is negative for pneumonia. Her labs are unremarkable including her pro BNP. Her EKG is also unremarkable. The plan will be to control symptoms and sent home with course of steroids, Avelox, primary care followup.  7829 Pt with continued exp wheeze. Sat 93% at this time. Second cont neb ordered. Reassess.  1056 AM. Pt with continued exp wheeze, min tachypnea. Not feeling back to baseline. 94% on RA. Discussed admission with patient, discussed admission with [facility].      Forbes Cellar, MD 02/27/11 1058

## 2011-02-27 NOTE — ED Notes (Signed)
Called TCU re: moving pt, no clean beds available, will call when clean bed is available for this pt

## 2011-02-27 NOTE — ED Notes (Signed)
Pt concerned about the safety of her vehicle that is parked in the ED parking lot now that she is going to be admitted.  Security officer in room to speak with pt, states her car is safe where it is parked, and it is OK for it to remain where it is parked.

## 2011-02-27 NOTE — ED Notes (Signed)
sts productive cough x 2 weeks thick white sputum

## 2011-02-27 NOTE — ED Notes (Signed)
rainbow drawn with iv start

## 2011-02-27 NOTE — ED Notes (Signed)
Pt complains of being short of breath and a cough for two weeks, has been seen before for the same

## 2011-02-27 NOTE — ED Notes (Signed)
O2 started via [place] x 2 liters

## 2011-02-27 NOTE — ED Notes (Signed)
Patient transported to X-ray 

## 2011-02-27 NOTE — ED Notes (Signed)
Nebulizer finished, pt states she is feeling much better, continues to have inspiratory and expiratory wheezes.

## 2011-02-27 NOTE — ED Notes (Signed)
Returned from xray

## 2011-02-28 DIAGNOSIS — J40 Bronchitis, not specified as acute or chronic: Secondary | ICD-10-CM | POA: Diagnosis present

## 2011-02-28 LAB — BASIC METABOLIC PANEL
BUN: 22 mg/dL (ref 6–23)
Chloride: 105 mEq/L (ref 96–112)
Creatinine, Ser: 0.96 mg/dL (ref 0.50–1.10)
GFR calc Af Amer: 69 mL/min — ABNORMAL LOW (ref >90)
GFR calc non Af Amer: 59 mL/min — ABNORMAL LOW (ref >90)
Glucose, Bld: 169 mg/dL — ABNORMAL HIGH (ref 70–99)

## 2011-02-28 LAB — GLUCOSE, CAPILLARY: Glucose-Capillary: 125 mg/dL — ABNORMAL HIGH (ref 70–99)

## 2011-02-28 MED ORDER — IPRATROPIUM BROMIDE 0.02 % IN SOLN: 0.5000 mg | RESPIRATORY_TRACT | Status: DC | PRN

## 2011-02-28 MED ORDER — DM-GUAIFENESIN ER 30-600 MG PO TB12
2.0000 | ORAL_TABLET | Freq: Two times a day (BID) | ORAL | Status: DC
  Administered 2011-02-28 – 2011-03-01 (×2): 2 via ORAL
  Filled 2011-02-28 (×3): qty 2

## 2011-02-28 MED ORDER — METOPROLOL SUCCINATE ER 25 MG PO TB24
25.0000 mg | ORAL_TABLET | Freq: Every day | ORAL | Status: DC
  Administered 2011-02-28 – 2011-03-01 (×2): 25 mg via ORAL
  Filled 2011-02-28 (×2): qty 1

## 2011-02-28 MED ORDER — PSEUDOEPHEDRINE HCL ER 120 MG PO TB12
120.0000 mg | ORAL_TABLET | Freq: Two times a day (BID) | ORAL | Status: DC
  Administered 2011-02-28 – 2011-03-01 (×3): 120 mg via ORAL
  Filled 2011-02-28 (×4): qty 1

## 2011-02-28 MED ORDER — LORATADINE 10 MG PO TABS
5.0000 mg | ORAL_TABLET | Freq: Two times a day (BID) | ORAL | Status: DC
  Administered 2011-02-28 – 2011-03-01 (×3): 5 mg via ORAL
  Filled 2011-02-28 (×4): qty 0.5

## 2011-02-28 MED ORDER — LORATADINE 10 MG PO TABS
10.0000 mg | ORAL_TABLET | Freq: Every day | ORAL | Status: DC | PRN
  Filled 2011-02-28: qty 1

## 2011-02-28 MED ORDER — LORATADINE 10 MG PO TABS
10.0000 mg | ORAL_TABLET | Freq: Two times a day (BID) | ORAL | Status: DC
  Filled 2011-02-28 (×2): qty 1

## 2011-02-28 MED ORDER — IPRATROPIUM BROMIDE 0.02 % IN SOLN
0.5000 mg | Freq: Four times a day (QID) | RESPIRATORY_TRACT | Status: DC
  Administered 2011-02-28 – 2011-03-01 (×4): 0.5 mg via RESPIRATORY_TRACT
  Filled 2011-02-28 (×5): qty 2.5

## 2011-02-28 NOTE — Progress Notes (Signed)
Pt ambulated on RA with 02 sat of 88%-89% and slight SOB.

## 2011-02-28 NOTE — Progress Notes (Signed)
I have seen and assessed patient and agree with Ceasar Mons assessment and plan. Will add clairitin D to regimen. Follow.

## 2011-02-28 NOTE — Progress Notes (Signed)
Subjective: "I am breathing better now" Denies chest pain/discomfort  Objective: Vital signs Filed Vitals:   02/27/11 2021 02/27/11 2158 02/28/11 0306 02/28/11 0644  BP:  136/71  143/76  Pulse:  96  88  Temp:  97.8 F (36.6 C)  97.5 F (36.4 C)  TempSrc:  Oral  Oral  Resp:  18  19  Height:      Weight:      SpO2: 95% 92% 93% 93%   Weight change:  Last BM Date: 02/27/11  Intake/Output from previous day: 02/12 0701 - 02/13 0700 In: 3 [I.V.:3] Out: -  Total I/O In: 240 [P.O.:240] Out: -    Physical Exam: General: Alert, awake, oriented x3, in no acute distress. Sitting up in bed watching TV HEENT: No bruits, no goiter. Mucus membranes slightly dry/pink, poor dentition PERRL Heart: Regular rate and rhythm, without murmurs, rubs, gallops. Lungs: Mild increased work of breathing with conversation. Breath sounds with scattered rhonchi underneath expiratory wheezes throughout.  Abdomen:Obese, Soft, nontender, nondistended, positive bowel sounds. Extremities: No clubbing cyanosis or edema with positive pedal pulses. MOE Neuro: Grossly intact, nonfocal. Speech clear.     Lab Results: Basic Metabolic Panel:  Basename 02/28/11 0426 02/27/11 0733  NA 139 141  K 3.9 4.5  CL 105 101  CO2 24 30  GLUCOSE 169* 122*  BUN 22 24*  CREATININE 0.96 1.26*  CALCIUM 9.7 10.2  MG -- --  PHOS -- --   Liver Function Tests: No results found for this basename: AST:2,ALT:2,ALKPHOS:2,BILITOT:2,PROT:2,ALBUMIN:2 in the last 72 hours No results found for this basename: LIPASE:2,AMYLASE:2 in the last 72 hours No results found for this basename: AMMONIA:2 in the last 72 hours CBC:  Basename 02/27/11 0733  WBC 9.4  NEUTROABS 5.0  HGB 12.8  HCT 39.3  MCV 84.7  PLT 287   Cardiac Enzymes:  Basename 02/27/11 0733  CKTOTAL --  CKMB --  CKMBINDEX --  TROPONINI <0.30   BNP:  Basename 02/27/11 0733  PROBNP 33.9   D-Dimer: No results found for this basename: DDIMER:2 in the last  72 hours CBG:  Basename 02/27/11 2104 02/27/11 1822  GLUCAP 187* 132*   Hemoglobin A1C: No results found for this basename: HGBA1C in the last 72 hours Fasting Lipid Panel: No results found for this basename: CHOL,HDL,LDLCALC,TRIG,CHOLHDL,LDLDIRECT in the last 72 hours Thyroid Function Tests: No results found for this basename: TSH,T4TOTAL,FREET4,T3FREE,THYROIDAB in the last 72 hours Anemia Panel: No results found for this basename: VITAMINB12,FOLATE,FERRITIN,TIBC,IRON,RETICCTPCT in the last 72 hours Coagulation: No results found for this basename: LABPROT:2,INR:2 in the last 72 hours Urine Drug Screen: Drugs of Abuse  No results found for this basename: labopia, cocainscrnur, labbenz, amphetmu, thcu, labbarb    Alcohol Level: No results found for this basename: ETH:2 in the last 72 hours Urinalysis: No results found for this basename: COLORURINE:2,APPERANCEUR:2,LABSPEC:2,PHURINE:2,GLUCOSEU:2,HGBUR:2,BILIRUBINUR:2,KETONESUR:2,PROTEINUR:2,UROBILINOGEN:2,NITRITE:2,LEUKOCYTESUR:2 in the last 72 hours Misc. Labs:  No results found for this or any previous visit (from the past 240 hour(s)).  Studies/Results: Dg Chest 2 View  02/27/2011  *RADIOLOGY REPORT*  Clinical Data: Shortness of breath, COPD, hypertension, diabetes and X smoker.  CHEST - 2 VIEW  Comparison: Chest x-ray 05/15/2010.  Findings: Lung volumes are normal.  No consolidative airspace disease.  Mild diffuse interstitial prominence with evidence of central airway thickening.  Pulmonary vasculature is normal.  Heart size is upper limits of normal.  Mediastinal contours are unremarkable.  Atherosclerosis of the thoracic aorta.  IMPRESSION:  1.  Mild diffuse interstitial prominence and thickening of  the central airways.  This may suggest a mild bronchitis. 2.  Extensive atherosclerosis of the thoracic aorta.  Original Report Authenticated By: Florencia Reasons, M.D.    Medications: Scheduled Meds:   . sodium chloride    Intravenous STAT  . albuterol      . aspirin  81 mg Oral Daily  . buPROPion  150 mg Oral Daily  . carvedilol  6.25 mg Oral BID WC  . cholecalciferol  2,000 Units Oral Daily  . dextromethorphan-guaiFENesin  1 tablet Oral BID  . enoxaparin  40 mg Subcutaneous Q24H  . losartan  100 mg Oral Daily   And  . hydrochlorothiazide  25 mg Oral Daily  . insulin aspart  0-15 Units Subcutaneous TID WC  . insulin aspart  0-5 Units Subcutaneous QHS  . levalbuterol  0.63 mg Nebulization Q6H  . levothyroxine  75 mcg Oral QAC breakfast  . linagliptin  5 mg Oral Daily  . methylPREDNISolone (SOLU-MEDROL) injection  40 mg Intravenous Q8H  . moxifloxacin  400 mg Intravenous Once  . moxifloxacin  400 mg Intravenous Q24H  . pantoprazole  40 mg Oral Q1200  . roflumilast  500 mcg Oral Daily  . sodium chloride  3 mL Intravenous Q12H  . sodium chloride  3 mL Intravenous Q12H  . DISCONTD: albuterol  2.5 mg Nebulization Q4H  . DISCONTD: losartan-hydrochlorothiazide  1 tablet Oral Daily  . DISCONTD: NON FORMULARY 100 mg  100 mg Oral Daily   Continuous Infusions:  PRN Meds:.sodium chloride, acetaminophen, acetaminophen, bisacodyl, guaiFENesin-dextromethorphan, levalbuterol, ondansetron (ZOFRAN) IV, ondansetron, sodium chloride  Assessment/Plan:  Principal Problem:  *COPD exacerbation Active Problems:  HYPERTENSION  PAF (paroxysmal atrial fibrillation)  Diabetes mellitus COPD exacerbation : Improved.-chest x-ray on admission with no acute infiltrates. Avelox day #2. Continue  nebulized bronchodilators mucolytics and IV steroids. Will check sats with ambulation. Currently Sats range 92-95 on room air. She quit smoking about a year and a half ago!  .Diabetes mellitus Will check HgA1c. -Will continue Januvia, monitor Accu-Cheks and cover with sliding scale insulin-given she is on steroids above monitor closely and adjust meds as appropriate, also taper her steroids as quickly as clinically appropriate.    Paroxysmal atrial fibrillation : on tele. Remains in SR/ST this am. Will continue meds Coreg/ASA.  Hypertension : fair control. Will continue home meds.  Dispo: Likely home in am if continues to improve.    LOS: 1 day   [facility] M 02/28/2011, 11:05 AM

## 2011-03-01 LAB — CBC
Hemoglobin: 12.9 g/dL (ref 12.0–15.0)
MCH: 27.9 pg (ref 26.0–34.0)
MCHC: 33.4 g/dL (ref 30.0–36.0)

## 2011-03-01 LAB — BASIC METABOLIC PANEL
BUN: 34 mg/dL — ABNORMAL HIGH (ref 6–23)
Creatinine, Ser: 1.32 mg/dL — ABNORMAL HIGH (ref 0.50–1.10)
GFR calc non Af Amer: 40 mL/min — ABNORMAL LOW (ref >90)
Glucose, Bld: 158 mg/dL — ABNORMAL HIGH (ref 70–99)
Potassium: 4 mEq/L (ref 3.5–5.1)

## 2011-03-01 LAB — HEMOGLOBIN A1C: Mean Plasma Glucose: 131 mg/dL — ABNORMAL HIGH (ref <117)

## 2011-03-01 MED ORDER — GUAIFENESIN-DM 100-10 MG/5ML PO SYRP: 5.0000 mL | ORAL_SOLUTION | Freq: Three times a day (TID) | ORAL | Status: AC | PRN

## 2011-03-01 MED ORDER — PREDNISONE 50 MG PO TABS: 60.0000 mg | ORAL_TABLET | Freq: Every day | ORAL | Status: DC

## 2011-03-01 MED ORDER — IPRATROPIUM-ALBUTEROL 18-103 MCG/ACT IN AERO: 2.0000 | INHALATION_SPRAY | Freq: Four times a day (QID) | RESPIRATORY_TRACT | Status: DC

## 2011-03-01 MED ORDER — LORATADINE 10 MG PO TABS: 5.0000 mg | ORAL_TABLET | Freq: Two times a day (BID) | ORAL | Status: DC

## 2011-03-01 MED ORDER — METHYLPREDNISOLONE SODIUM SUCC 40 MG IJ SOLR
40.0000 mg | Freq: Two times a day (BID) | INTRAMUSCULAR | Status: DC
  Filled 2011-03-01 (×2): qty 1

## 2011-03-01 MED ORDER — MOXIFLOXACIN HCL 400 MG PO TABS: 400.0000 mg | ORAL_TABLET | Freq: Every day | ORAL | Status: AC

## 2011-03-01 MED ORDER — MOXIFLOXACIN HCL 400 MG PO TABS
400.0000 mg | ORAL_TABLET | Freq: Every day | ORAL | Status: DC
  Filled 2011-03-01: qty 1

## 2011-03-01 MED ORDER — ACETAMINOPHEN 325 MG PO TABS: 650.0000 mg | ORAL_TABLET | Freq: Four times a day (QID) | ORAL | Status: DC | PRN

## 2011-03-01 MED ORDER — PREDNISONE 20 MG PO TABS: ORAL_TABLET | ORAL | Status: DC

## 2011-03-01 MED ORDER — LINAGLIPTIN 5 MG PO TABS: 5.0000 mg | ORAL_TABLET | Freq: Every day | ORAL | Status: DC

## 2011-03-01 MED ORDER — METOPROLOL SUCCINATE ER 25 MG PO TB24: 25.0000 mg | ORAL_TABLET | Freq: Every day | ORAL | Status: DC

## 2011-03-01 MED ORDER — DM-GUAIFENESIN ER 30-600 MG PO TB12: 2.0000 | ORAL_TABLET | Freq: Two times a day (BID) | ORAL | Status: AC

## 2011-03-01 NOTE — Progress Notes (Signed)
Pt ambulated again today with 02 sat of 91-92% on RA. No SOB noted.

## 2011-03-01 NOTE — Discharge Instructions (Addendum)

## 2011-03-01 NOTE — Progress Notes (Signed)
Subjective: "I am breathing better and ready to go home".  Denies pain/discomfort. No events during night.   Objective: Vital signs Filed Vitals:   02/28/11 2049 03/01/11 0211 03/01/11 0503 03/01/11 0812  BP: 163/84  157/78   Pulse: 88  73   Temp: 97.6 F (36.4 C)  97.9 F (36.6 C)   TempSrc: Oral  Oral   Resp: 20  21   Height:      Weight:      SpO2: 94% 93% 91% 94%   Weight change:  Last BM Date: 02/28/11  Intake/Output from previous day: 02/13 0701 - 02/14 0700 In: 540 [P.O.:540] Out: 300 [Urine:300]     Physical Exam: General: Alert, awake, oriented x3, in no acute distress. Sitting up in bed eating. HEENT: No bruits, no goiter. Mucus membranes moist/pink. PERRL Heart: Regular rate and rhythm, without murmurs, rubs, gallops. Lungs:Normal effort with conversation. Breath sounds with mild scattered rhonchi and faint expiratory wheeze.  Abdomen: Obese.  Soft, nontender, nondistended, positive bowel sounds. Extremities: No clubbing cyanosis or edema with positive pedal pulses. Neuro: Grossly intact, nonfocal. Speech clear.     Lab Results: Basic Metabolic Panel:  Basename 03/01/11 0418 02/28/11 0426  NA 139 139  K 4.0 3.9  CL 103 105  CO2 23 24  GLUCOSE 158* 169*  BUN 34* 22  CREATININE 1.32* 0.96  CALCIUM 10.1 9.7  MG -- --  PHOS -- --   Liver Function Tests: No results found for this basename: AST:2,ALT:2,ALKPHOS:2,BILITOT:2,PROT:2,ALBUMIN:2 in the last 72 hours No results found for this basename: LIPASE:2,AMYLASE:2 in the last 72 hours No results found for this basename: AMMONIA:2 in the last 72 hours CBC:  Basename 03/01/11 0418 02/27/11 0733  WBC 18.5* 9.4  NEUTROABS -- 5.0  HGB 12.9 12.8  HCT 38.6 39.3  MCV 83.5 84.7  PLT 332 287   Cardiac Enzymes:  Basename 02/27/11 0733  CKTOTAL --  CKMB --  CKMBINDEX --  TROPONINI <0.30   BNP:  Basename 02/27/11 0733  PROBNP 33.9   D-Dimer: No results found for this basename: DDIMER:2 in the  last 72 hours CBG:  Basename 03/01/11 1146 03/01/11 0820 02/28/11 2136 02/28/11 1716 02/28/11 1209 02/28/11 0813  GLUCAP 122* 173* 140* 125* 131* 151*   Hemoglobin A1C:  Basename 02/28/11 1317  HGBA1C 6.2*   Fasting Lipid Panel: No results found for this basename: CHOL,HDL,LDLCALC,TRIG,CHOLHDL,LDLDIRECT in the last 72 hours Thyroid Function Tests: No results found for this basename: TSH,T4TOTAL,FREET4,T3FREE,THYROIDAB in the last 72 hours Anemia Panel: No results found for this basename: VITAMINB12,FOLATE,FERRITIN,TIBC,IRON,RETICCTPCT in the last 72 hours Coagulation: No results found for this basename: LABPROT:2,INR:2 in the last 72 hours Urine Drug Screen: Drugs of Abuse  No results found for this basename: labopia, cocainscrnur, labbenz, amphetmu, thcu, labbarb    Alcohol Level: No results found for this basename: ETH:2 in the last 72 hours Urinalysis: No results found for this basename: COLORURINE:2,APPERANCEUR:2,LABSPEC:2,PHURINE:2,GLUCOSEU:2,HGBUR:2,BILIRUBINUR:2,KETONESUR:2,PROTEINUR:2,UROBILINOGEN:2,NITRITE:2,LEUKOCYTESUR:2 in the last 72 hours Misc. Labs:  No results found for this or any previous visit (from the past 240 hour(s)).  Studies/Results: No results found.  Medications: Scheduled Meds:   . aspirin  81 mg Oral Daily  . buPROPion  150 mg Oral Daily  . cholecalciferol  2,000 Units Oral Daily  . dextromethorphan-guaiFENesin  2 tablet Oral BID  . enoxaparin  40 mg Subcutaneous Q24H  . losartan  100 mg Oral Daily   And  . hydrochlorothiazide  25 mg Oral Daily  . insulin aspart  0-15 Units Subcutaneous  TID WC  . insulin aspart  0-5 Units Subcutaneous QHS  . ipratropium  0.5 mg Nebulization Q6H  . levalbuterol  0.63 mg Nebulization Q6H  . levothyroxine  75 mcg Oral QAC breakfast  . linagliptin  5 mg Oral Daily  . loratadine  5 mg Oral BID  . metoprolol succinate  25 mg Oral Daily  . moxifloxacin  400 mg Oral q1800  . pantoprazole  40 mg Oral Q1200  .  predniSONE  60 mg Oral Q breakfast  . pseudoephedrine  120 mg Oral BID  . roflumilast  500 mcg Oral Daily  . sodium chloride  3 mL Intravenous Q12H  . sodium chloride  3 mL Intravenous Q12H  . DISCONTD: carvedilol  6.25 mg Oral BID WC  . DISCONTD: methylPREDNISolone (SOLU-MEDROL) injection  40 mg Intravenous Q8H  . DISCONTD: methylPREDNISolone (SOLU-MEDROL) injection  40 mg Intravenous Q12H  . DISCONTD: moxifloxacin  400 mg Intravenous Q24H   Continuous Infusions:  PRN Meds:.sodium chloride, acetaminophen, acetaminophen, bisacodyl, guaiFENesin-dextromethorphan, ipratropium, levalbuterol, ondansetron (ZOFRAN) IV, ondansetron, sodium chloride  Assessment/Plan:  Principal Problem:  *COPD exacerbation Active Problems:  HYPERTENSION  PAF (paroxysmal atrial fibrillation)  Diabetes mellitus  Bronchitis COPD exacerbation :Much Improved.-chest x-ray on admission with no acute infiltrates. Avelox day #3. Will transition to po. Continue nebulized bronchodilators mucolytics. Will discontinue solumedrol and start prednisone for quick taper. Sats with ambulation improved this am to 91-92% on room air .  She quit smoking about a year and a half ago!  .Diabetes mellitus  HgA1c 6.2. -Continue Januvia,CBG rang 122-173. Likely related to steroids.  Cover with sliding scale insulin-  Paroxysmal atrial fibrillation : on tele. Remains in SR/ST this am. Will continue meds Coreg/ASA.  Hypertension : fair control. Will continue home meds. 2decho pending to eval LV function.  Dispo: Home when medically stable.      LOS: 2 days   [facility] M 03/01/2011, 1:06 PM

## 2011-03-01 NOTE — Discharge Summary (Signed)
Physician Discharge Summary  Patient ID: [name] MRN: [id number] DOB/AGE: January 11, 1941 71 y.o.  Admit date: 02/27/2011 Discharge date: 03/01/2011  Primary Care Physician:  Hayden Rasmussen, NP, NP   Discharge Diagnoses:    Present on Admission:  .COPD exacerbation .HYPERTENSION .Diabetes mellitus .Bronchitis  Medication List  As of 03/01/2011  2:58 PM   STOP taking these medications         carvedilol 6.25 MG tablet         TAKE these medications         acetaminophen 325 MG tablet   Commonly known as: TYLENOL   Take 2 tablets (650 mg total) by mouth every 6 (six) hours as needed (or Fever >/= 101).      albuterol-ipratropium 18-103 MCG/ACT inhaler   Commonly known as: COMBIVENT   Inhale 2 puffs into the lungs every 6 (six) hours. For Wheezing shortness of breathInhale 2 puffs into the lungs every 6 (six) hours for 4 days then resume 2 puffs every 6 hours prn wheezing.      aspirin 81 MG chewable tablet   Chew 81 mg by mouth daily.      buPROPion 150 MG 12 hr tablet   Commonly known as: WELLBUTRIN SR   Take 150 mg by mouth daily.      CALCIUM MAGNESIUM PO   Take by mouth 2 (two) times daily.      dextromethorphan-guaiFENesin 30-600 MG per 12 hr tablet   Commonly known as: MUCINEX DM   Take 2 tablets by mouth 2 (two) times daily.      guaiFENesin-dextromethorphan 100-10 MG/5ML syrup   Commonly known as: ROBITUSSIN DM   Take 5 mLs by mouth 3 (three) times daily as needed for cough.      levothyroxine 75 MCG tablet   Commonly known as: SYNTHROID, LEVOTHROID   Take 75 mcg by mouth daily.      linagliptin 5 MG Tabs tablet   Commonly known as: TRADJENTA   Take 1 tablet (5 mg total) by mouth daily.      loratadine 10 MG tablet   Commonly known as: CLARITIN   Take 0.5 tablets (5 mg total) by mouth 2 (two) times daily.      losartan-hydrochlorothiazide 100-25 MG per tablet   Commonly known as: HYZAAR   Take 1 tablet by mouth daily.      metoprolol succinate 25  MG 24 hr tablet   Commonly known as: TOPROL-XL   Take 1 tablet (25 mg total) by mouth daily.      moxifloxacin 400 MG tablet   Commonly known as: AVELOX   Take 1 tablet (400 mg total) by mouth daily at 6 PM.      omeprazole 40 MG capsule   Commonly known as: PRILOSEC   Take 40 mg by mouth daily.      predniSONE 20 MG tablet   Commonly known as: DELTASONE   Take 60 mg by mouth  with breakfast 2/15 and 2/16, take 40 mg by mouth 2/17 and 2/18 then take 20 mg by mouth 2/19 and 2/20 then stop    Dispense amount sufficient please      roflumilast 500 MCG Tabs tablet   Commonly known as: DALIRESP   Take 500 mcg by mouth daily.      sitaGLIPtin 100 MG tablet   Commonly known as: JANUVIA   Take 100 mg by mouth daily.      Vitamin D 2000 UNITS tablet   Take 2,000  Units by mouth daily.             Disposition and Follow-up: Pt medically stable and ready for discharge to home. Will follow up with PCP in 1 week. Results of 2-d echo to be followed up on.   Consults:  None   Physical exam:  General: Alert, awake, oriented x3, in no acute distress. Sitting up in bed eating.  HEENT: No bruits, no goiter. Mucus membranes moist/pink. PERRL  Heart: Regular rate and rhythm, without murmurs, rubs, gallops.  Lungs:Normal effort with conversation. Breath sounds with mild scattered rhonchi and faint expiratory wheeze.  Abdomen: Obese. Soft, nontender, nondistended, positive bowel sounds.  Extremities: No clubbing cyanosis or edema with positive pedal pulses.  Neuro: Grossly intact, nonfocal. Speech clear.    Significant Diagnostic Studies:  Dg Chest 2 View  02/27/2011  *RADIOLOGY REPORT*  Clinical Data: Shortness of breath, COPD, hypertension, diabetes and X smoker.  CHEST - 2 VIEW  Comparison: Chest x-ray 05/15/2010.  Findings: Lung volumes are normal.  No consolidative airspace disease.  Mild diffuse interstitial prominence with evidence of central airway thickening.  Pulmonary  vasculature is normal.  Heart size is upper limits of normal.  Mediastinal contours are unremarkable.  Atherosclerosis of the thoracic aorta.  IMPRESSION:  1.  Mild diffuse interstitial prominence and thickening of the central airways.  This may suggest a mild bronchitis. 2.  Extensive atherosclerosis of the thoracic aorta.  Original Report Authenticated By: Florencia Reasons, M.D.    Labs Reviewed  DIFFERENTIAL - Abnormal; Notable for the following:    Eosinophils Relative 12 (*)    Eosinophils Absolute 1.2 (*)    All other components within normal limits  BASIC METABOLIC PANEL - Abnormal; Notable for the following:    Glucose, Bld 122 (*)    BUN 24 (*)    Creatinine, Ser 1.26 (*)    GFR calc non Af Amer 43 (*)    GFR calc Af Amer 50 (*)    All other components within normal limits  BASIC METABOLIC PANEL - Abnormal; Notable for the following:    Glucose, Bld 169 (*)    GFR calc non Af Amer 59 (*)    GFR calc Af Amer 69 (*)    All other components within normal limits  GLUCOSE, CAPILLARY - Abnormal; Notable for the following:    Glucose-Capillary 132 (*)    All other components within normal limits  GLUCOSE, CAPILLARY - Abnormal; Notable for the following:    Glucose-Capillary 187 (*)    All other components within normal limits  HEMOGLOBIN A1C - Abnormal; Notable for the following:    Hemoglobin A1C 6.2 (*)    Mean Plasma Glucose 131 (*)    All other components within normal limits  GLUCOSE, CAPILLARY - Abnormal; Notable for the following:    Glucose-Capillary 151 (*)    All other components within normal limits  GLUCOSE, CAPILLARY - Abnormal; Notable for the following:    Glucose-Capillary 131 (*)    All other components within normal limits  BASIC METABOLIC PANEL - Abnormal; Notable for the following:    Glucose, Bld 158 (*)    BUN 34 (*)    Creatinine, Ser 1.32 (*)    GFR calc non Af Amer 40 (*)    GFR calc Af Amer 47 (*)    All other components within normal limits    CBC - Abnormal; Notable for the following:    WBC 18.5 (*)  All other components within normal limits  GLUCOSE, CAPILLARY - Abnormal; Notable for the following:    Glucose-Capillary 125 (*)    All other components within normal limits  GLUCOSE, CAPILLARY - Abnormal; Notable for the following:    Glucose-Capillary 140 (*)    All other components within normal limits  GLUCOSE, CAPILLARY - Abnormal; Notable for the following:    Glucose-Capillary 173 (*)    All other components within normal limits  GLUCOSE, CAPILLARY - Abnormal; Notable for the following:    Glucose-Capillary 122 (*)    All other components within normal limits  CBC  TROPONIN I  PRO B NATRIURETIC PEPTIDE        Dg Chest 2 View  02/27/2011  *RADIOLOGY REPORT*  Clinical Data: Shortness of breath, COPD, hypertension, diabetes and X smoker.  CHEST - 2 VIEW  Comparison: Chest x-ray 05/15/2010.  Findings: Lung volumes are normal.  No consolidative airspace disease.  Mild diffuse interstitial prominence with evidence of central airway thickening.  Pulmonary vasculature is normal.  Heart size is upper limits of normal.  Mediastinal contours are unremarkable.  Atherosclerosis of the thoracic aorta.  IMPRESSION:  1.  Mild diffuse interstitial prominence and thickening of the central airways.  This may suggest a mild bronchitis. 2.  Extensive atherosclerosis of the thoracic aorta.  Original Report Authenticated By: Florencia Reasons, M.D.       Brief H and P: For complete details please refer to admission H and P, but in brief   The patient is a pleasant 71 year old white female with past medical history significant for COPD, paroxysmal atrial fibrillation who presents to [facility] ED on 02/27/11 with CC worsening SOB. She states that she was in her usual state of health until about 1-1/2-2 weeks ago when she developed shortness of breath and a cough that progressively worsened. The cough has been mostly productive of" "milky" thick  sputum, but became nonproductive today. She denies fevers, also denies chest pain. She short of breath with minimal exertion. She denies peripheral edema, PND. She was seen in the ED and a chest x-ray was done and showed findings consistent with bronchitis. Per ED physician she received 2 continuous treatments of nebulized bronchodilators but has symptoms were persisting and so she is admitted for further evaluation and management. A brain natruretic peptide was done and came back normal at 33.9, point-of-care markers were negative, EKG showed normal sinus rhythm with no acute ischemic changes.    Hospital Course:  No resolved problems to display.  Active Hospital Problems  Diagnoses Date Noted   . COPD exacerbation 02/27/2011   . Bronchitis 02/28/2011   . Diabetes mellitus 12/13/2010   . PAF (paroxysmal atrial fibrillation) 12/13/2010   . HYPERTENSION 10/21/2006     Resolved Hospital Problems  Diagnoses Date Noted Date Resolved    Principal Problem:  *COPD exacerbation Active Problems:  HYPERTENSION  PAF (paroxysmal atrial fibrillation)  Diabetes mellitus  Bronchitis COPD exacerbation : Admitted to tele. Provided with 02 support, duonebs, solumedrol and avelox. Chest x-ray on admission with no acute infiltrates but consistent with bronchitis. Pt quickly responded to treatment. On day of discharge solumedrol transitioned to prednisone for quick taper and po avelox for 4 more days. On day of discharge 02 Sats with ambulation improved  91-92% on room air . She quit smoking about a year and a half ago!  Will follow up with PCP in 1 week.  .Diabetes mellitus HgA1c 6.2. -Continue Januvia,CBG rang 122-173. Likely related to  steroids. She was covered with SS insulin. Will resume home regimen and monitor CBG's.   Paroxysmal atrial fibrillation : on tele remained in SR/ST during this hospitalization. Coreg changed to Toprol . ASA continued.   Hypertension : fair control. Will continue Toprol and  losaartan and HCTZ. 2decho results pending to eval LV  function. Results to be followed up on.     Time spent on Discharge:46min  Signed: Gwenyth Bender 03/01/2011, 2:58 PM

## 2011-03-01 NOTE — Progress Notes (Signed)
  Echocardiogram 2D Echocardiogram has been performed.  Jorje Guild [facility] 03/01/2011, 1:51 PM

## 2011-03-02 NOTE — Progress Notes (Signed)
I have seen and assessed patient and agree with Brittney Smothers, NP assessment and plan. Patient stable for discharge.

## 2011-03-02 NOTE — Discharge Summary (Signed)
I have seen and assessed patient and agree with Ceasar Mons assessment and plan.Patient stable for D/C.

## 2011-03-06 ENCOUNTER — Encounter (HOSPITAL_COMMUNITY): Payer: Self-pay | Admitting: Emergency Medicine

## 2011-03-06 ENCOUNTER — Emergency Department (HOSPITAL_COMMUNITY)
Admission: EM | Admit: 2011-03-06 | Discharge: 2011-03-06 | Disposition: A | Discharge status: Home / Self Care | Payer: PRIVATE HEALTH INSURANCE | Attending: Emergency Medicine | Admitting: Emergency Medicine

## 2011-03-06 DIAGNOSIS — E039 Hypothyroidism, unspecified: Secondary | ICD-10-CM | POA: Insufficient documentation

## 2011-03-06 DIAGNOSIS — Z7982 Long term (current) use of aspirin: Secondary | ICD-10-CM | POA: Insufficient documentation

## 2011-03-06 DIAGNOSIS — L259 Unspecified contact dermatitis, unspecified cause: Secondary | ICD-10-CM | POA: Insufficient documentation

## 2011-03-06 DIAGNOSIS — K219 Gastro-esophageal reflux disease without esophagitis: Secondary | ICD-10-CM | POA: Insufficient documentation

## 2011-03-06 DIAGNOSIS — E119 Type 2 diabetes mellitus without complications: Secondary | ICD-10-CM | POA: Insufficient documentation

## 2011-03-06 DIAGNOSIS — E7800 Pure hypercholesterolemia, unspecified: Secondary | ICD-10-CM | POA: Insufficient documentation

## 2011-03-06 DIAGNOSIS — J449 Chronic obstructive pulmonary disease, unspecified: Secondary | ICD-10-CM | POA: Insufficient documentation

## 2011-03-06 DIAGNOSIS — I10 Essential (primary) hypertension: Secondary | ICD-10-CM | POA: Insufficient documentation

## 2011-03-06 DIAGNOSIS — T7840XA Allergy, unspecified, initial encounter: Secondary | ICD-10-CM

## 2011-03-06 DIAGNOSIS — R061 Stridor: Secondary | ICD-10-CM | POA: Insufficient documentation

## 2011-03-06 DIAGNOSIS — M129 Arthropathy, unspecified: Secondary | ICD-10-CM | POA: Insufficient documentation

## 2011-03-06 DIAGNOSIS — J4489 Other specified chronic obstructive pulmonary disease: Secondary | ICD-10-CM | POA: Insufficient documentation

## 2011-03-06 DIAGNOSIS — Z79899 Other long term (current) drug therapy: Secondary | ICD-10-CM | POA: Insufficient documentation

## 2011-03-06 MED ORDER — METHYLPREDNISOLONE SODIUM SUCC 125 MG IJ SOLR
125.0000 mg | Freq: Once | INTRAMUSCULAR | Status: DC
  Filled 2011-03-06: qty 2

## 2011-03-06 MED ORDER — DIPHENHYDRAMINE HCL 25 MG PO CAPS: 25.0000 mg | ORAL_CAPSULE | Freq: Four times a day (QID) | ORAL | Status: AC | PRN

## 2011-03-06 MED ORDER — PREDNISONE 20 MG PO TABS: 60.0000 mg | ORAL_TABLET | Freq: Every day | ORAL | Status: AC

## 2011-03-06 MED ORDER — DIPHENHYDRAMINE HCL 25 MG PO CAPS
25.0000 mg | ORAL_CAPSULE | Freq: Once | ORAL | Status: AC
  Administered 2011-03-06: 25 mg via ORAL
  Filled 2011-03-06: qty 1

## 2011-03-06 MED ORDER — METHYLPREDNISOLONE SODIUM SUCC 125 MG IJ SOLR
125.0000 mg | Freq: Once | INTRAMUSCULAR | Status: AC
  Administered 2011-03-06: 125 mg via INTRAMUSCULAR

## 2011-03-06 NOTE — ED Notes (Signed)
Pt believes medication was the avelox, started last week, last dose was Monday

## 2011-03-06 NOTE — ED Notes (Signed)
States she just finished her last dose of Avelox.  Has been taking Claritin without relief.  States she was in the hospital a couple of weeks ago and had cardiac leads on. States that she has had a similar reaction when she had leads on previously.

## 2011-03-06 NOTE — ED Notes (Signed)
Pt alert, nad, c/o itching burning to arms, chest, onset was yesterday am, state has appointment with dermatologist in am, resp even unlabored, skin pwd, no stridor noted,denies chest pain or sob

## 2011-03-06 NOTE — ED Provider Notes (Signed)
History     CSN: [id number]  Arrival date & time 03/06/11  6962   First MD Initiated Contact with Patient 03/06/11 [phone]      Chief Complaint  Patient presents with  . Medication Reaction    (Consider location/radiation/quality/duration/timing/severity/associated sxs/prior treatment) HPI Itching rash to torso and upper extremities. Started 2 days ago as just a rash. Became very itchy last night despite taking Benadryl so she presents here. She has multiple known drug allergies and gets rashes like this on a frequent basis. She is scheduled to see a dermatologist tomorrow. No shortness of breath or difficulty breathing. No trauma or lip swelling. No difficulty swallowing and she denies any sensation in her throat. She was recently started on a number of new medications 03/01/2011 by her physician. No other known exposures.   Past Medical History  Diagnosis Date  . COPD (chronic obstructive pulmonary disease)   . Hypertension   . PAD (peripheral artery disease)   . Hypothyroidism   . Hypercholesteremia   . Obesity   . Shortness of breath   . Hyperthyroidism   . GERD (gastroesophageal reflux disease)   . Arthritis   . Diabetes mellitus     Past Surgical History  Procedure Date  . Femoral-femoral bypass graft 05/09/2010    Right to Left Fem-Fem BPG by Dr. [name]  . Tubal ligation   . Descending aortic aneurysm repair w/ stent 12/12/10    Stent repair    Family History  Problem Relation Age of Onset  . Diabetes Mother   . Heart disease Mother   . Heart disease Sister     Heart attack  . Cancer Sister     History  Substance Use Topics  . Smoking status: Former Smoker -- 47 years    Types: Cigarettes    Quit date: 01/15/2010  . Smokeless tobacco: Never Used  . Alcohol Use: No    OB History    Grav Para Term Preterm Abortions TAB SAB Ect Mult Living                  Review of Systems  Constitutional: Negative for fever and chills.  HENT: Negative for sore  throat, sneezing, drooling, neck pain and neck stiffness.   Eyes: Negative for pain.  Respiratory: Negative for shortness of breath, wheezing and stridor.   Cardiovascular: Negative for chest pain.  Gastrointestinal: Negative for vomiting and abdominal pain.  Genitourinary: Negative for dysuria.  Musculoskeletal: Negative for back pain.  Skin: Positive for rash.  Neurological: Negative for headaches.  All other systems reviewed and are negative.    Allergies  Glucophage; Fosamax; Metformin and related; Omnipaque; Adhesive; Amoxicillin; Cephalexin; Contrast media; and Plavix  Home Medications   Current Outpatient Rx  Name Route Sig Dispense Refill  . ASPIRIN 81 MG PO CHEW Oral Chew 81 mg by mouth daily.      . BUPROPION HCL ER (SR) 150 MG PO TB12 Oral Take 150 mg by mouth daily.      Marland Kitchen CALCIUM MAGNESIUM PO Oral Take by mouth 2 (two) times daily.      Marland Kitchen VITAMIN D 2000 UNITS PO TABS Oral Take 2,000 Units by mouth daily.     Marland Kitchen DM-GUAIFENESIN ER 30-600 MG PO TB12 Oral Take 2 tablets by mouth 2 (two) times daily. 30 tablet 0  . GUAIFENESIN-DM 100-10 MG/5ML PO SYRP Oral Take 5 mLs by mouth 3 (three) times daily as needed for cough. 118 mL 0  . LEVOTHYROXINE SODIUM  75 MCG PO TABS Oral Take 75 mcg by mouth daily.    Marland Kitchen LINAGLIPTIN 5 MG PO TABS Oral Take 1 tablet (5 mg total) by mouth daily. 30 tablet 0  . LORATADINE 10 MG PO TABS Oral Take 0.5 tablets (5 mg total) by mouth 2 (two) times daily. 30 tablet 0  . LOSARTAN POTASSIUM-HCTZ 100-25 MG PO TABS Oral Take 1 tablet by mouth daily.      Marland Kitchen METOPROLOL SUCCINATE ER 25 MG PO TB24 Oral Take 1 tablet (25 mg total) by mouth daily. 30 tablet 0  . MOXIFLOXACIN HCL 400 MG PO TABS Oral Take 1 tablet (400 mg total) by mouth daily at 6 PM. 4 tablet 0  . OMEPRAZOLE 40 MG PO CPDR Oral Take 40 mg by mouth daily.      . ROFLUMILAST 500 MCG PO TABS Oral Take 500 mcg by mouth daily.    Marland Kitchen SITAGLIPTIN PHOSPHATE 100 MG PO TABS Oral Take 100 mg by mouth daily.      Marland Kitchen PREDNISONE 20 MG PO TABS  Take 60 mg by mouth  with breakfast 2/15 and 2/16, take 40 mg by mouth 2/17 and 2/18 then take 20 mg by mouth 2/19 and 2/20 then stop  Dispense amount sufficient please 12 tablet 0    BP 116/57  Pulse 70  Temp(Src) 97.8 F (36.6 C) (Oral)  Resp 17  SpO2 93%  Physical Exam  Constitutional: She is oriented to person, place, and time. She appears well-developed and well-nourished.  HENT:  Head: Normocephalic and atraumatic.       Uvula midline. No lingual or oral swelling  Eyes: Conjunctivae and EOM are normal. Pupils are equal, round, and reactive to light.  Neck: Neck supple.  Cardiovascular: Normal rate, regular rhythm, S1 normal, S2 normal and normal pulses.     No systolic murmur is present   No diastolic murmur is present  Pulses:      Radial pulses are 2+ on the right side, and 2+ on the left side.  Pulmonary/Chest: Effort normal and breath sounds normal. Stridor present. No respiratory distress. She has no wheezes. She has no rhonchi.  Abdominal: Soft. Normal appearance and bowel sounds are normal. There is no tenderness. There is no CVA tenderness and negative Murphy's sign.  Musculoskeletal:       BLE:s Calves nontender, no cords or erythema, negative Homans sign  Neurological: She is alert and oriented to person, place, and time. She has normal strength. No cranial nerve deficit or sensory deficit. GCS eye subscore is 4. GCS verbal subscore is 5. GCS motor subscore is 6.  Skin: Skin is warm and dry. Rash noted. She is not diaphoretic.       Erythematous blanching macular rash to torso and upper extremities. Does not involve face or mucous membranes  Psychiatric: Her speech is normal.       Cooperative and appropriate    ED Course  Procedures (including critical care time)  Intramuscular Solu-Medrol and by mouth Benadryl as patient is driving. She is requesting to be discharged home so she is medications.   MDM   Rash and suspect  allergic reaction. Patient on multiple medications including prednisone and Claritin. I recommended that she stop all other new medications including Avelox, Robitussin and Mucinex until evaluated by a dermatologist and primary care physician. No respiratory complaints with allergic reaction for the last 48 hours. Plan prednisone and Benadryl and followup with dermatologist as scheduled tomorrow  Sunnie Nielsen, MD 03/06/11 [phone]

## 2011-03-06 NOTE — Discharge Instructions (Signed)
Allergic Reaction, Mild to Moderate  Stop taking Avelox, Robitussin, Mucinex until evaluated by your physician. Return here immediately for any difficulty breathing or swelling in your tongue or lips or throat. Otherwise keep your scheduled followup appointment. Take prednisone and Benadryl as prescribed.  Allergies may happen from anything your body is sensitive to. This may be food, medications, pollens, chemicals, and nearly anything around you in everyday life that produces allergens. An allergen is anything that causes an allergy producing substance. Allergens cause your body to release allergic antibodies. Through a chain of events, they cause a release of histamine into the blood stream. Histamines are meant to protect you, but they also cause your discomfort. This is why antihistamines are often used for allergies. Heredity is often a factor in causing allergic reactions. This means you may have some of the same allergies as your parents. Allergies happen in all age groups. You may have some idea of what caused your reaction. There are many allergens around [place]. It may be difficult to know what caused your reaction. If this is a first time event, it may never happen again. Allergies cannot be cured but can be controlled with medications. SYMPTOMS  You may get some or all of the following problems from allergies.  Swelling and itching in and around the mouth.   Tearing, itchy eyes.   Nasal congestion and runny nose.   Sneezing and coughing.   An itchy red rash or hives.   Vomiting or diarrhea.   Difficulty breathing.  Seasonal allergies occur in all age groups. They are seasonal because they usually occur during the same season every year. They may be a reaction to molds, grass pollens, or tree pollens. Other causes of allergies are house dust mite allergens, pet dander and mold spores. These are just a common few of the thousands of allergens around [place]. All of the symptoms listed above  happen when you come in contact with pollens and other allergens. Seasonal allergies are usually not life threatening. They are generally more of a nuisance that can often be handled using medications. Hay fever is a combination of all or some of the above listed allergy problems. It may often be treated with simple over-the-counter medications such as diphenhydramine. Take medication as directed. Check with your caregiver or package insert for child dosages. TREATMENT AND HOME CARE INSTRUCTIONS If hives or rash are present:  Take medications as directed.   You may use an over-the-counter antihistamine (diphenhydramine) for hives and itching as needed. Do not drive or drink alcohol until medications used to treat the reaction have worn off. Antihistamines tend to make people sleepy.   Apply cold cloths (compresses) to the skin or take baths in cool water. This will help itching. Avoid hot baths or showers. Heat will make a rash and itching worse.   If your allergies persist and become more severe, and over the counter medications are not effective, there are many new medications your caretaker can prescribe. Immunotherapy or desensitizing injections can be used if all else fails. Follow up with your caregiver if problems continue.  SEEK MEDICAL CARE IF:   Your allergies are becoming progressively more troublesome.   You suspect a food allergy. Symptoms generally happen within 30 minutes of eating a food.   Your symptoms have not gone away within 2 days or are getting worse.   You develop new symptoms.   You want to retest yourself or your child with a food or drink you think  causes an allergic reaction. Never test yourself or your child of a suspected allergy without being under the watchful eye of your caregivers. A second exposure to an allergen may be life-threatening.  SEEK IMMEDIATE MEDICAL CARE IF:  You develop difficulty breathing or wheezing, or have a tight feeling in your chest or  throat.   You develop a swollen mouth, hives, swelling, or itching all over your body.  A severe reaction with any of the above problems should be considered life-threatening. If you suddenly develop difficulty breathing call for local emergency medical help. THIS IS AN EMERGENCY. MAKE SURE YOU:   Understand these instructions.   Will watch your condition.   Will get help right away if you are not doing well or get worse.  Document Released: 10/29/2006 Document Revised: 09/13/2010 Document Reviewed: 10/29/2006 [facility] Patient Information 2012 [place], [place].

## 2011-03-11 NOTE — H&P (Signed)
PLEASE NOTE THIS H&P WAS INITIALLY DONE ON  02/27/11 BUT MISLABELLED AS A PROGRESS NOTE.  Brittney Millin, MD Physician Signed   02/27/2011 7:19 PM  PCP:   Hayden Rasmussen, NP, NP     Chief Complaint:   Worsening shortness of breath   HPI: The patient is a pleasant 71 year old white female with past medical history significant for COPD, paroxysmal atrial fibrillation who presents with above complaints. She states that she was in her usual state of health until about 1-1/2-2 weeks ago when she developed shortness of breath and a cough that progressively worsened. The cough has been mostly productive of" "milky" thick sputum, but became nonproductive today. She denies fevers, also denies chest pain. She short of breath with minimal exertion. She denies peripheral edema, PND. She was seen in the ED and a chest x-ray was done and showed findings consistent with bronchitis. Per ED physician she received 2 continuous treatments of nebulized bronchodilators but has symptoms were persisting and so she is admitted for further evaluation and management. A brain natruretic peptide was done and came back normal at 33.9, point-of-care markers were negative, EKG showed normal sinus rhythm with no acute ischemic changes..   Review of Systems:  The patient denies anorexia, fever, weight loss,, vision loss, decreased hearing, hoarseness, chest pain, syncope, peripheral edema, balance deficits, hemoptysis, abdominal pain, melena, hematochezia, severe indigestion/heartburn, hematuria, incontinence, muscle weakness, transient blindness, difficulty walking, depression, unusual weight change.   Past Medical History: Past Medical History   Diagnosis  Date   .  COPD (chronic obstructive pulmonary disease)     .  Hypertension     .  PAD (peripheral artery disease)     .  Hypothyroidism     .  Hypercholesteremia     .  Obesity     .  Shortness of breath     .  Hyperthyroidism     .  GERD (gastroesophageal reflux  disease)     .  Arthritis     .  Diabetes mellitus      . Paroxysmal atrial f fibrillation Past Surgical History   Procedure  Date   .  Femoral-femoral bypass graft  05/09/2010       Right to Left Fem-Fem BPG by Dr. [name]   .  Tubal ligation     .  Descending aortic aneurysm repair w/ stent  12/12/10       Stent repair      Medications: Prior to Admission medications    Medication  Sig  Start Date  End Date  Taking?  Authorizing Provider   albuterol-ipratropium (COMBIVENT) 18-103 MCG/ACT inhaler  Inhale 2 puffs into the lungs every 6 (six) hours as needed. Wheezing shortness of breath      Yes  Historical Provider, MD   aspirin 81 MG chewable tablet  Chew 81 mg by mouth daily.        Yes  Historical Provider, MD   buPROPion (WELLBUTRIN SR) 150 MG 12 hr tablet  Take 150 mg by mouth daily.        Yes  Historical Provider, MD   Calcium-Magnesium-Vitamin D (CALCIUM MAGNESIUM PO)  Take by mouth 2 (two) times daily.        Yes  Historical Provider, MD   carvedilol (COREG) 6.25 MG tablet  Take 6.25 mg by mouth 2 (two) times daily with a meal.      Yes  Historical Provider, MD   Cholecalciferol (VITAMIN D) 2000 UNITS tablet  Take 2,000 Units by mouth daily.       Yes  Historical Provider, MD   levothyroxine (SYNTHROID, LEVOTHROID) 75 MCG tablet  Take 75 mcg by mouth daily.      Yes  Historical Provider, MD   losartan-hydrochlorothiazide (HYZAAR) 100-25 MG per tablet  Take 1 tablet by mouth daily.        Yes  Historical Provider, MD   omeprazole (PRILOSEC) 40 MG capsule  Take 40 mg by mouth daily.        Yes  Historical Provider, MD   roflumilast (DALIRESP) 500 MCG TABS tablet  Take 500 mcg by mouth daily.      Yes  Historical Provider, MD   sitaGLIPtin (JANUVIA) 100 MG tablet  Take 100 mg by mouth daily.      Yes  Historical Provider, MD        Allergies:   Allergies   Allergen  Reactions   .  Glucophage (Metformin Hydrochloride)  Itching and Rash       REPORTED BY PATIENT; STATES  SYMPTOMS APPEARED AFTER THIRD DOES WITHIN THE LAST MONTH.   Marland Kitchen  Fosamax  Other (See Comments)       Unknown     .  Metformin And Related     .  Omnipaque (Iohexol)  Other (See Comments)       Unknown     .  Adhesive (Tape)  Rash   .  Amoxicillin  Itching and Rash   .  Cephalexin  Itching and Rash   .  Contrast Media (Iodinated Diagnostic Agents)  Itching and Rash   .  Plavix (Clopidogrel Bisulfate)  Itching and Rash      Social History: reports that she quit smoking about 13 months ago. Her smoking use included Cigarettes. She quit after 47 years of use. She has never used smokeless tobacco. She reports that she does not drink alcohol or use illicit drugs.   Family History: Family History   Problem  Relation  Age of Onset   .  Diabetes  Mother     .  Heart disease  Mother     .  Heart disease  Sister         Heart attack   .  Cancer  Sister        Physical Exam: Filed Vitals:     02/27/11 1330  02/27/11 1529  02/27/11 1615  02/27/11 1859   BP:  160/83    139/66  149/75   Pulse:  108    108  106   Temp:        97.7 F (36.5 C)   TempSrc:        Oral   Resp:  24    17  18    Height:        5\' 2"  (1.575 m)   Weight:        94.802 kg (209 lb)   SpO2:  96%  96%  97%  93%    Constitutional: Vital signs reviewed, obese in no acute distress, no accessory muscle use, and cooperative with exam. Alert and oriented x3.   Head: Normocephalic and atraumatic Mouth: no erythema or exudates, MMM Eyes: PERRL, EOMI, conjunctivae normal, No scleral icterus.  Neck: Supple, Trachea midline normal ROM, No JVD, mass, thyromegaly, or carotid bruit present.  Cardiovascular: RRR, S1 normal, S2 normal, no MRG, pulses symmetric and intact bilaterally Pulmonary/Chest: Scattered wheezes bilaterally, no rales, or rhonchi Abdominal: Soft. Mild epigastric  tenderness, non-distended, bowel sounds are normal, no masses, organomegaly, or guarding present.   Extremities: No cyanosis or edema.    Neurological: A&O x3, Strenght is normal and symmetric bilaterally, cranial nerve II-XII are grossly intact, no focal motor deficit, sensory intact to light touch bilaterally.  Skin: Warm, dry and intact. No rash Psychiatric: Normal mood and affect.   Labs on Admission:  [facility]  02/27/11 0733   NA  141   K  4.5   CL  101   CO2  30   GLUCOSE  122*   BUN  24*   CREATININE  1.26*   CALCIUM  10.2   MG  --   PHOS  --    No results found for this basename: AST:2,ALT:2,ALKPHOS:2,BILITOT:2,PROT:2,ALBUMIN:2 in the last 72 hours No results found for this basename: LIPASE:2,AMYLASE:2 in the last 72 hours Basename  02/27/11 0733   WBC  9.4   NEUTROABS  5.0   HGB  12.8   HCT  39.3   MCV  84.7   PLT  287    Basename  02/27/11 0733   CKTOTAL  --   CKMB  --   CKMBINDEX  --   TROPONINI  <0.30    No results found for this basename: TSH,T4TOTAL,FREET3,T3FREE,THYROIDAB in the last 72 hours No results found for this basename: VITAMINB12:2,FOLATE:2,FERRITIN:2,TIBC:2,IRON:2,RETICCTPCT:2 in the last 72 hours   Radiological Exams on Admission: Dg Chest 2 View   02/27/2011  *RADIOLOGY REPORT*  Clinical Data: Shortness of breath, COPD, hypertension, diabetes and X smoker.  CHEST - 2 VIEW  Comparison: Chest x-ray 05/15/2010.  Findings: Lung volumes are normal.  No consolidative airspace disease.  Mild diffuse interstitial prominence with evidence of central airway thickening.  Pulmonary vasculature is normal.  Heart size is upper limits of normal.  Mediastinal contours are unremarkable.  Atherosclerosis of the thoracic aorta.  IMPRESSION:  1.  Mild diffuse interstitial prominence and thickening of the central airways.  This may suggest a mild bronchitis. 2.  Extensive atherosclerosis of the thoracic aorta.  Original Report Authenticated By: Florencia Reasons, M.D.    Mm Digital Screening   01/30/2011  DG SCREEN MAMMOGRAM BILATERAL Bilateral CC and MLO view(s) were taken.  DIGITAL SCREENING  MAMMOGRAM WITH CAD: The breast tissue is almost entirely fatty.  No masses or malignant type calcifications are  identified.  Compared with prior studies.  Images were processed with CAD.  IMPRESSION: No specific mammographic evidence of malignancy.  Next screening mammogram is recommended in one  year.  A result letter of this screening mammogram will be mailed directly to the patient.  ASSESSMENT: Negative - BI-RADS 1  Screening mammogram in 1 year. ,      Assessment/Plan Present on Admission:  .COPD exacerbation -As discussed above, chest x-ray on admission with no acute infiltrates. Will place on antibiotics, nebulized bronchodilators mucolytics and IV steroids.follow and further manage accordingly pending evolution of clinical course. She quit smoking about a year and a half ago!  .Diabetes mellitus -Will continue Januvia, monitor Accu-Cheks and cover with sliding scale insulin-given she is on steroids above monitor closely and adjust meds as appropriate, also taper her steroids as quickly as clinically appropriate. Paroxysmal atrial fibrillation -She is in sinus rhythm at this time, monitor on telemetry continue outpatient medications-Coreg and aspirin. Hypertension -Continue outpatient medications. Nury Nebergall C 02/27/2011, 7:20 PM

## 2011-04-03 ENCOUNTER — Other Ambulatory Visit: Payer: Self-pay | Admitting: Endocrinology

## 2011-04-03 DIAGNOSIS — E049 Nontoxic goiter, unspecified: Secondary | ICD-10-CM

## 2011-04-04 ENCOUNTER — Institutional Professional Consult (permissible substitution): Payer: PRIVATE HEALTH INSURANCE | Admitting: Internal Medicine

## 2011-04-06 ENCOUNTER — Other Ambulatory Visit: Payer: PRIVATE HEALTH INSURANCE

## 2011-05-01 ENCOUNTER — Encounter: Payer: Self-pay | Admitting: Pulmonary Disease

## 2011-05-01 ENCOUNTER — Ambulatory Visit (INDEPENDENT_AMBULATORY_CARE_PROVIDER_SITE_OTHER)
Admission: RE | Admit: 2011-05-01 | Discharge: 2011-05-01 | Disposition: A | Discharge status: Home / Self Care | Payer: Medicare Other | Source: Ambulatory Visit | Attending: Pulmonary Disease | Admitting: Pulmonary Disease

## 2011-05-01 ENCOUNTER — Ambulatory Visit (INDEPENDENT_AMBULATORY_CARE_PROVIDER_SITE_OTHER): Payer: Medicare Other | Admitting: Pulmonary Disease

## 2011-05-01 VITALS — BP 118/64 | HR 73 | Temp 97.5°F | Ht 62.0 in | Wt 220.8 lb

## 2011-05-01 DIAGNOSIS — R0600 Dyspnea, unspecified: Secondary | ICD-10-CM | POA: Insufficient documentation

## 2011-05-01 DIAGNOSIS — R0609 Other forms of dyspnea: Secondary | ICD-10-CM

## 2011-05-01 DIAGNOSIS — R0989 Other specified symptoms and signs involving the circulatory and respiratory systems: Secondary | ICD-10-CM

## 2011-05-01 MED ORDER — IPRATROPIUM-ALBUTEROL 18-103 MCG/ACT IN AERO: 2.0000 | INHALATION_SPRAY | Freq: Four times a day (QID) | RESPIRATORY_TRACT | Status: DC | PRN

## 2011-05-01 NOTE — Assessment & Plan Note (Addendum)
This is likely multifactorial.  She extensive history of smoking and carries a diagnosis of COPD.  To further assess will repeat her chest xray today, and arrange for pulmonary function testing.  She is to continue prn combivent for now.  She has history of hypertension, diastolic dysfunction, and peripheral vascular disease.  These are being managed by Dr. Gery Pray with cardiology and Dr. [name] with vascular surgery.  I suspect a significant part of her dyspnea is also related to her weight and deconditioning.

## 2011-05-01 NOTE — Patient Instructions (Signed)
Chest xray today Will schedule breathing test (PFT) Follow up in 2 to 3 weeks 

## 2011-05-01 NOTE — Progress Notes (Deleted)
  Subjective:    Patient ID: [name], female    DOB: [birth year], 71 y.o.   MRN: [id number]  HPI    Review of Systems  Constitutional: Negative for appetite change and unexpected weight change.  HENT: Negative for ear pain, congestion, sore throat, trouble swallowing and dental problem.   Respiratory: Positive for cough and shortness of breath.   Cardiovascular: Negative for chest pain, palpitations and leg swelling.  Gastrointestinal: Negative for abdominal pain.  Musculoskeletal: Negative for joint swelling.  Skin: Negative for rash.  Neurological: Negative for headaches.  Psychiatric/Behavioral: Negative for dysphoric mood. The patient is not nervous/anxious.        Objective:   Physical Exam        Assessment & Plan:

## 2011-05-01 NOTE — Progress Notes (Signed)
Chief Complaint  Patient presents with  . Pulmonary Consult    Pt c/o increase SOB w/ exertion. She states she gasps for air when she exerts herself and she c/o wheezing and chest tx    History of Present Illness: [name] is a 71 y.o. female former smoker for evaluation of dyspnea.  She has noticed trouble with her breathing for years.  This has gotten progressively worse over the past two years.  She can only walk about 2 blocks before getting winded.  She will get a cough if she exerts too much.  She has trouble with allergies, and sneezes a lot when pollen counts are high.  She gets frequent episodes of bronchitis. She does occasionally wheeze.  She was told she has COPD, but does not recall ever having a PFT before.  She was started on Qvar and Combivent.  She is no longer using Qvar, and felt like this made her breathing worse.  She uses combivent about once per week, but is not sure how much this helps.  She was given a course of prednisone about one month ago for a bronchitis and this seemed to help.  She is no longer taking prednisone.  She quit smoking in January 2012.  She does not have any exposure to animals or birds.  There is no history of thrombo-embolic disease.  There is no history of pneumonia, or tuberculosis.  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  She is from [place], and denies any recent travel history.  She has not had any recent sick exposures.  She is followed by Dr. Gery Pray with [facility] cardiology, and Dr. [name] with vascular surgery.   Past Medical History  Diagnosis Date  . COPD (chronic obstructive pulmonary disease)   . Hypertension   . PAD (peripheral artery disease)   . Hypothyroidism   . Hypercholesteremia   . Obesity   . Shortness of breath   . Hyperthyroidism   . GERD (gastroesophageal reflux disease)   . Arthritis   . Diabetes mellitus     Past Surgical History  Procedure Date  . Femoral-femoral bypass graft 05/09/2010    Right to Left Fem-Fem BPG  by Dr. [name]  . Tubal ligation   . Descending aortic aneurysm repair w/ stent 12/12/10    Stent repair    Current Outpatient Prescriptions on File Prior to Visit  Medication Sig Dispense Refill  . aspirin 81 MG chewable tablet Chew 81 mg by mouth daily.        . Calcium-Magnesium-Vitamin D (CALCIUM MAGNESIUM PO) Take by mouth 2 (two) times daily.        . Cholecalciferol (VITAMIN D) 2000 UNITS tablet Take 2,000 Units by mouth daily.       Marland Kitchen levothyroxine (SYNTHROID, LEVOTHROID) 75 MCG tablet Take 75 mcg by mouth daily.      Marland Kitchen omeprazole (PRILOSEC) 40 MG capsule Take 40 mg by mouth daily.        . sitaGLIPtin (JANUVIA) 100 MG tablet Take 100 mg by mouth daily.      . valsartan-hydrochlorothiazide (DIOVAN-HCT) 160-12.5 MG per tablet Take 1 tablet by mouth daily.        Allergies  Allergen Reactions  . Glucophage (Metformin Hydrochloride) Itching and Rash    REPORTED BY PATIENT; STATES SYMPTOMS APPEARED AFTER THIRD DOES WITHIN THE LAST MONTH.  Marland Kitchen Avelox (Moxifloxacin Hcl In Nacl)     Itching,rash  . Fosamax Other (See Comments)    Unknown   . Latex  rash  . Metformin And Related   . Omnipaque (Iohexol) Other (See Comments)    Unknown   . Adhesive (Tape) Rash  . Amoxicillin Itching and Rash  . Cephalexin Itching and Rash  . Contrast Media (Iodinated Diagnostic Agents) Itching and Rash  . Plavix (Clopidogrel Bisulfate) Itching and Rash    family history includes Allergies in her daughter; Cancer in her sister; Diabetes in her mother; and Heart disease in her mother.   reports that she quit smoking about 15 months ago. Her smoking use included Cigarettes. She has a 94 pack-year smoking history. She has never used smokeless tobacco. She reports that she does not drink alcohol or use illicit drugs.  Review of Systems  Constitutional: Negative for appetite change and unexpected weight change.  HENT: Negative for ear pain, congestion, sore throat, trouble swallowing and dental  problem.   Respiratory: Positive for cough and shortness of breath.   Cardiovascular: Negative for chest pain, palpitations and leg swelling.  Gastrointestinal: Negative for abdominal pain.  Musculoskeletal: Negative for joint swelling.  Skin: Negative for rash.  Neurological: Negative for headaches.  Psychiatric/Behavioral: Negative for dysphoric mood. The patient is not nervous/anxious.     Physical Exam: BP 118/64  Pulse 73  Temp(Src) 97.5 F (36.4 C) (Oral)  Ht 5\' 2"  (1.575 m)  Wt 220 lb 12.8 oz (100.154 kg)  BMI 40.38 kg/m2  SpO2 96% Body mass index is 40.38 kg/(m^2).  General - obese HEENT - PERRLA, EOMI, no sinus tenderness, no oral exudate, no LAN Cardiac - s1s2 regular, no murmur Chest - prolonged exhalation, decreased breath sounds, no wheeze/rales/dullness Abdomen - obese, soft, nontender Extremities - no e/c/c Neurologic - normals strength, CN intact Skin - no rashes Psychiatric - normal mood, behavior  V/Q scan 4/30>>very low probabilty for pulmonary embolism Echo 03/01/11>>mod LVH, EF 65 to 70%, grade 1 diastolic dysfx, mild LA dilation, mild RA/RV dilation  CBC    Component Value Date/Time   WBC 18.5* 03/01/2011 0418   RBC 4.62 03/01/2011 0418   HGB 12.9 03/01/2011 0418   HCT 38.6 03/01/2011 0418   PLT 332 03/01/2011 0418   MCV 83.5 03/01/2011 0418   MCH 27.9 03/01/2011 0418   MCHC 33.4 03/01/2011 0418   RDW 14.5 03/01/2011 0418   LYMPHSABS 2.4 02/27/2011 0733   MONOABS 0.7 02/27/2011 0733   EOSABS 1.2* 02/27/2011 0733   BASOSABS 0.1 02/27/2011 0733    BMET    Component Value Date/Time   NA 139 03/01/2011 0418   K 4.0 03/01/2011 0418   CL 103 03/01/2011 0418   CO2 23 03/01/2011 0418   GLUCOSE 158* 03/01/2011 0418   BUN 34* 03/01/2011 0418   CREATININE 1.32* 03/01/2011 0418   CALCIUM 10.1 03/01/2011 0418   GFRNONAA 40* 03/01/2011 0418   GFRAA 47* 03/01/2011 0418    Lab Results  Component Value Date   ALT 39* 05/02/2010   AST 39* 05/02/2010   ALKPHOS 55  05/02/2010   BILITOT 0.3 05/02/2010   Lab Results  Component Value Date   TSH 3.018 05/14/2010    Assessment/Plan:  Outpatient Encounter Prescriptions as of 05/01/2011  Medication Sig Dispense Refill  . aspirin 81 MG chewable tablet Chew 81 mg by mouth daily.        . Calcium-Magnesium-Vitamin D (CALCIUM MAGNESIUM PO) Take by mouth 2 (two) times daily.        . carvedilol (COREG) 6.25 MG tablet 2 tablets .twice a day      .  Cholecalciferol (VITAMIN D) 2000 UNITS tablet Take 2,000 Units by mouth daily.       Marland Kitchen levothyroxine (SYNTHROID, LEVOTHROID) 75 MCG tablet Take 75 mcg by mouth daily.      Marland Kitchen omeprazole (PRILOSEC) 40 MG capsule Take 40 mg by mouth daily.        . sitaGLIPtin (JANUVIA) 100 MG tablet Take 100 mg by mouth daily.      . valsartan-hydrochlorothiazide (DIOVAN-HCT) 160-12.5 MG per tablet Take 1 tablet by mouth daily.      Marland Kitchen DISCONTD: buPROPion (WELLBUTRIN SR) 150 MG 12 hr tablet Take 150 mg by mouth daily.        Marland Kitchen DISCONTD: linagliptin (TRADJENTA) 5 MG TABS tablet Take 1 tablet (5 mg total) by mouth daily.  30 tablet  0  . DISCONTD: loratadine (CLARITIN) 10 MG tablet Take 0.5 tablets (5 mg total) by mouth 2 (two) times daily.  30 tablet  0  . DISCONTD: losartan-hydrochlorothiazide (HYZAAR) 100-25 MG per tablet Take 1 tablet by mouth daily.        Marland Kitchen DISCONTD: metoprolol succinate (TOPROL-XL) 25 MG 24 hr tablet Take 1 tablet (25 mg total) by mouth daily.  30 tablet  0  . DISCONTD: predniSONE (DELTASONE) 20 MG tablet Take 60 mg by mouth  with breakfast 2/15 and 2/16, take 40 mg by mouth 2/17 and 2/18 then take 20 mg by mouth 2/19 and 2/20 then stop  Dispense amount sufficient please  12 tablet  0  . DISCONTD: roflumilast (DALIRESP) 500 MCG TABS tablet Take 500 mcg by mouth daily.        Aniah Pauli Pager:  [phone] 05/01/2011, 10:54 AM

## 2011-05-10 ENCOUNTER — Telehealth: Payer: Self-pay | Admitting: Pulmonary Disease

## 2011-05-10 NOTE — Telephone Encounter (Signed)
Dg Chest 2 View  05/01/2011  *RADIOLOGY REPORT*  Clinical Data:  Former smoker, history COPD, progressive dyspnea, hypertension, chronic bronchitis  CHEST - 2 VIEW  Comparison: 02/27/2011  Findings: Upper normal heart size. Atherosclerotic calcification aorta. Mediastinal contours and pulmonary vascularity otherwise normal. Minimal chronic peribronchial thickening. No pulmonary infiltrate, pleural effusion or pneumothorax. Bones demineralized.  IMPRESSION: Chronic bronchitic changes.  Original Report Authenticated By: Lollie Marrow, M.D.    Will have my nurse inform patient that CXR showed expected changes from history of smoking.  No other worrisome findings.  She can continue combivent as needed.  Will discuss in more detail at [facility] on May 13.

## 2011-05-11 NOTE — Telephone Encounter (Signed)
I spoke with patient about results and she verbalized understanding and had no questions 

## 2011-05-16 ENCOUNTER — Institutional Professional Consult (permissible substitution): Payer: PRIVATE HEALTH INSURANCE | Admitting: Internal Medicine

## 2011-05-28 ENCOUNTER — Ambulatory Visit (INDEPENDENT_AMBULATORY_CARE_PROVIDER_SITE_OTHER): Payer: Medicare Other | Admitting: Pulmonary Disease

## 2011-05-28 ENCOUNTER — Encounter: Payer: Self-pay | Admitting: Pulmonary Disease

## 2011-05-28 DIAGNOSIS — R0609 Other forms of dyspnea: Secondary | ICD-10-CM

## 2011-05-28 DIAGNOSIS — R0600 Dyspnea, unspecified: Secondary | ICD-10-CM

## 2011-05-28 DIAGNOSIS — J45909 Unspecified asthma, uncomplicated: Secondary | ICD-10-CM

## 2011-05-28 DIAGNOSIS — J449 Chronic obstructive pulmonary disease, unspecified: Secondary | ICD-10-CM | POA: Insufficient documentation

## 2011-05-28 DIAGNOSIS — R0989 Other specified symptoms and signs involving the circulatory and respiratory systems: Secondary | ICD-10-CM

## 2011-05-28 LAB — PULMONARY FUNCTION TEST

## 2011-05-28 MED ORDER — AEROCHAMBER MV MISC: Status: DC

## 2011-05-28 MED ORDER — MOMETASONE FURO-FORMOTEROL FUM 100-5 MCG/ACT IN AERO: 2.0000 | INHALATION_SPRAY | Freq: Two times a day (BID) | RESPIRATORY_TRACT | Status: DC

## 2011-05-28 MED ORDER — ALBUTEROL SULFATE HFA 108 (90 BASE) MCG/ACT IN AERS: 2.0000 | INHALATION_SPRAY | Freq: Four times a day (QID) | RESPIRATORY_TRACT | Status: DC | PRN

## 2011-05-28 NOTE — Progress Notes (Signed)
PFT done today. 

## 2011-05-28 NOTE — Progress Notes (Signed)
Chief Complaint  Patient presents with  . Follow-up    w/ pft. Pt states the combivent is coming off the market and so she stopped the combivent last week and felt since doing this her breathing is better and her cough is better. occasional wheezing and chest tx--doing much better since stopping the combivent    History of Present Illness: [name] is a 71 y.o. female former smoker with dyspnea.  She is here to review her PFT's.  She still has cough and wheeze.  She stopped using combivent, and noticed she has less throat irritation.  She is not sure if Qvar helped her.  She had to stop after a few uses due to throat irritation.   Past Medical History  Diagnosis Date  . COPD (chronic obstructive pulmonary disease)   . Hypertension   . PAD (peripheral artery disease)   . Hypothyroidism   . Hypercholesteremia   . Obesity   . Shortness of breath   . Hyperthyroidism   . GERD (gastroesophageal reflux disease)   . Arthritis   . Diabetes mellitus     Past Surgical History  Procedure Date  . Femoral-femoral bypass graft 05/09/2010    Right to Left Fem-Fem BPG by Dr. [name]  . Tubal ligation   . Descending aortic aneurysm repair w/ stent 12/12/10    Stent repair    Allergies  Allergen Reactions  . Glucophage (Metformin Hydrochloride) Itching and Rash    REPORTED BY PATIENT; STATES SYMPTOMS APPEARED AFTER THIRD DOES WITHIN THE LAST MONTH.  Marland Kitchen Alendronate Sodium Other (See Comments)    Unknown   . Avelox (Moxifloxacin Hcl In Nacl)     Itching,rash  . Latex     rash  . Metformin And Related   . Omnipaque (Iohexol) Other (See Comments)    Unknown   . Adhesive (Tape) Rash  . Amoxicillin Itching and Rash  . Cephalexin Itching and Rash  . Contrast Media (Iodinated Diagnostic Agents) Itching and Rash  . Plavix (Clopidogrel Bisulfate) Itching and Rash    Physical Exam:  Blood pressure 110/68, pulse 74, temperature 97.9 F (36.6 C), temperature source Oral, height 5'  2" (1.575 m), weight 217 lb (98.431 kg), SpO2 93.00%.  Body mass index is 39.69 kg/(m^2).  Wt Readings from Last 2 Encounters:  05/28/11 217 lb (98.431 kg)  05/01/11 220 lb 12.8 oz (100.154 kg)    HEENT - No sinus tenderness, no oral exudate, no LAN  Cardiac - s1s2 regular, no murmur  Chest - prolonged exhalation, decreased breath sounds, faint basilar expiratory  Wheeze, no rales/dullness  Abdomen - obese, soft, nontender  Extremities - no e/c/c  Neurologic - normals strength Skin - no rashes  Psychiatric - normal mood, behavior   PFT 05/28/11>>FEV1 1.01 (54%), FEV1% 73, TLC 4.44 (100%), RV 2.74 (156%), DLCO 49%, +BD response  Dg Chest 2 View  05/01/2011  *RADIOLOGY REPORT*   Clinical Data:  Former smoker, history COPD, progressive dyspnea, hypertension, chronic bronchitis   CHEST - 2 VIEW   Comparison: 02/27/2011   Findings: Upper normal heart size. Atherosclerotic calcification aorta. Mediastinal contours and pulmonary vascularity otherwise normal. Minimal chronic peribronchial thickening. No pulmonary infiltrate, pleural effusion or pneumothorax. Bones demineralized.   IMPRESSION: Chronic bronchitic changes.   Original Report Authenticated By: Lollie Marrow, M.D.    Assessment/Plan:  Outpatient Encounter Prescriptions as of 05/28/2011  Medication Sig Dispense Refill  . aspirin 81 MG chewable tablet Chew 81 mg by mouth daily.        Marland Kitchen  Calcium-Magnesium-Vitamin D (CALCIUM MAGNESIUM PO) Take by mouth 2 (two) times daily.        . carvedilol (COREG) 6.25 MG tablet 2 tablets .twice a day      . Cholecalciferol (VITAMIN D) 2000 UNITS tablet Take 2,000 Units by mouth daily.       Marland Kitchen levothyroxine (SYNTHROID, LEVOTHROID) 75 MCG tablet Take 75 mcg by mouth daily.      Marland Kitchen omeprazole (PRILOSEC) 40 MG capsule Take 40 mg by mouth daily.        . sitaGLIPtin (JANUVIA) 100 MG tablet Take 100 mg by mouth daily.      Marland Kitchen DISCONTD: valsartan-hydrochlorothiazide (DIOVAN-HCT) 160-12.5 MG per  tablet Take 1 tablet by mouth daily.      Marland Kitchen albuterol (PROAIR HFA) 108 (90 BASE) MCG/ACT inhaler Inhale 2 puffs into the lungs every 6 (six) hours as needed for wheezing.  1 Inhaler  5  . mometasone-formoterol (DULERA) 100-5 MCG/ACT AERO Inhale 2 puffs into the lungs 2 (two) times daily.  1 Inhaler  5  . Spacer/Aero-Holding Chambers (AEROCHAMBER MV) inhaler Use as instructed  1 each  0  . DISCONTD: albuterol-ipratropium (COMBIVENT) 18-103 MCG/ACT inhaler Inhale 2 puffs into the lungs every 6 (six) hours. For Wheezing shortness of breathInhale 2 puffs into the lungs every 6 (six) hours for 4 days then resume 2 puffs every 6 hours prn wheezing.  1 Inhaler  0  . DISCONTD: albuterol-ipratropium (COMBIVENT) 18-103 MCG/ACT inhaler Inhale 2 puffs into the lungs 4 (four) times daily as needed for wheezing or shortness of breath.  1 Inhaler  2    [name] Pager:  [phone] 05/28/2011, 12:06 PM

## 2011-05-28 NOTE — Assessment & Plan Note (Signed)
She has evidence for borderline obstruction with small airway disease, bronchodilator responsiveness and airtrapping on PFT today.  Her recent chest xray showed chronic bronchitis changes.  She continues to have cough and wheeze.  She had trouble with inhaler therapy before due to upper airway irritation.  I have reviewed her PFT and chest xray results with her.  Will start her on Dulera 100/5 two puffs bid, and advised her to rinse her mouth after each use.  She is to use proair two puffs as needed.  Demonstrated proper inhaler use.  Have given her an Aerochamber, and this should decrease upper airway irritation from using her inhalers.

## 2011-05-28 NOTE — Patient Instructions (Signed)
Dulera 100/5>>use two puffs twice per day, and rinse mouth after each use Proair two puffs as needed for cough, wheeze, or chest congestion Follow up in 2 months

## 2011-05-28 NOTE — Assessment & Plan Note (Addendum)
This is likely multifactorial.  She extensive history of smoking.  Chest xray and PFT's are consistent with chronic obstructive asthma.  Will re-assess after adjusting her inhaler regimen.  She has history of hypertension, diastolic dysfunction, and peripheral vascular disease.  These are being managed by Dr. Gery Pray with cardiology and Dr. [name] with vascular surgery.  I suspect her dyspnea is also related to her weight and deconditioning.  Will need to discuss option of pulmonary rehab at her next visit after assessing her response to inhaler therapy.

## 2011-06-26 ENCOUNTER — Encounter: Payer: Self-pay | Admitting: Vascular Surgery

## 2011-06-27 ENCOUNTER — Ambulatory Visit (INDEPENDENT_AMBULATORY_CARE_PROVIDER_SITE_OTHER): Payer: Medicare Other | Admitting: Vascular Surgery

## 2011-06-27 ENCOUNTER — Encounter: Payer: Self-pay | Admitting: Vascular Surgery

## 2011-06-27 VITALS — BP 143/73 | HR 92 | Temp 98.3°F | Ht 62.0 in | Wt 222.0 lb

## 2011-06-27 DIAGNOSIS — I739 Peripheral vascular disease, unspecified: Secondary | ICD-10-CM

## 2011-06-27 DIAGNOSIS — I70219 Atherosclerosis of native arteries of extremities with intermittent claudication, unspecified extremity: Secondary | ICD-10-CM | POA: Insufficient documentation

## 2011-06-27 NOTE — Progress Notes (Signed)
[facility] [place]  Patient name: [name] MRN: [id number] DOB: 07/31/1940 Sex: female  REASON FOR VISIT: follow up of fem-fem bypass graft.  HPI: [name] is a 71 y.o. female who underwent vein patch angioplasty of her right common femoral artery and a right to left fem-fem bypass graft with a PTFE graft in April of 2012. I last saw her in January of this year which time her graft is working well. She was due for follow visit in January 2014, however, apparent she had studies done at Dr. Clayborne Dana office which suggested that her graft was occluded. I do not have these records yet.  Her main complaint today is low back pain. This is aggravated by activity. I do not get any clear-cut history of claudication. However, her activity is very limited because of her back pain. She gives no history of rest pain. She's had no nonhealing ulcers.  Past Medical History  Diagnosis Date  . Hypertension   . PAD (peripheral artery disease)   . Hypothyroidism   . Hypercholesteremia   . Obesity   . Shortness of breath   . Hyperthyroidism   . GERD (gastroesophageal reflux disease)   . Arthritis   . Diabetes mellitus   . Chronic obstructive asthma 05/28/2011  . COPD (chronic obstructive pulmonary disease)     Family History  Problem Relation Age of Onset  . Diabetes Mother   . Heart disease Mother   . Hyperlipidemia Mother   . Cancer Sister   . Allergies Daughter     x3    SOCIAL HISTORY: History  Substance Use Topics  . Smoking status: Former Smoker -- 2.0 packs/day for 47 years    Types: Cigarettes    Quit date: 01/17/2010  . Smokeless tobacco: Never Used  . Alcohol Use: No    Allergies  Allergen Reactions  . Glucophage (Metformin Hydrochloride) Itching and Rash    REPORTED BY PATIENT; STATES SYMPTOMS APPEARED AFTER THIRD DOES WITHIN THE LAST MONTH.  Marland Kitchen Alendronate Sodium Other (See Comments)    Unknown   . Avelox (Moxifloxacin Hcl In Nacl)      Itching,rash  . Latex     rash  . Metformin And Related   . Omnipaque (Iohexol) Other (See Comments)    Unknown   . Adhesive (Tape) Rash  . Amoxicillin Itching and Rash  . Cephalexin Itching and Rash  . Contrast Media (Iodinated Diagnostic Agents) Itching and Rash  . Plavix (Clopidogrel Bisulfate) Itching and Rash    Current Outpatient Prescriptions  Medication Sig Dispense Refill  . albuterol (PROAIR HFA) 108 (90 BASE) MCG/ACT inhaler Inhale 2 puffs into the lungs every 6 (six) hours as needed for wheezing.  1 Inhaler  5  . aspirin 81 MG chewable tablet Chew 81 mg by mouth daily.        . Calcium-Magnesium-Vitamin D (CALCIUM MAGNESIUM PO) Take by mouth 2 (two) times daily.        . carvedilol (COREG) 6.25 MG tablet 2 tablets .twice a day      . Cholecalciferol (VITAMIN D) 2000 UNITS tablet Take 2,000 Units by mouth daily.       Marland Kitchen levothyroxine (SYNTHROID, LEVOTHROID) 75 MCG tablet Take 75 mcg by mouth daily.      . mometasone-formoterol (DULERA) 100-5 MCG/ACT AERO Inhale 2 puffs into the lungs 2 (two) times daily.  1 Inhaler  5  . omeprazole (PRILOSEC) 40 MG capsule Take 40 mg by mouth daily.        Marland Kitchen  sitaGLIPtin (JANUVIA) 100 MG tablet Take 100 mg by mouth daily.      Marland Kitchen Spacer/Aero-Holding Chambers (AEROCHAMBER MV) inhaler Use as instructed  1 each  0    REVIEW OF SYSTEMS: [name] ] denotes positive finding; [  ] denotes negative finding  CARDIOVASCULAR:  [ ]  chest pain   [ ]  chest pressure   [ ]  palpitations   [ ]  orthopnea   [ ]  dyspnea on exertion   [ ]  claudication   [ ]  rest pain   [ ]  DVT   [ ]  phlebitis PULMONARY:   [ ]  productive cough   [name] ] asthma   [name] ] wheezing NEUROLOGIC:   [ ]  weakness  [ ]  paresthesias  [ ]  aphasia  [ ]  amaurosis  [ ]  dizziness HEMATOLOGIC:   [ ]  bleeding problems   [ ]  clotting disorders MUSCULOSKELETAL:  [ ]  joint pain   [ ]  joint swelling [ ]  leg swelling CONSTITUTIONAL:  [ ]  fever   [ ]  chills  PHYSICAL EXAM: Filed Vitals:   06/27/11 1157    BP: 143/73  Pulse: 92  Temp: 98.3 F (36.8 C)  TempSrc: Oral  Height: 5\' 2"  (1.575 m)  Weight: 222 lb (100.699 kg)  SpO2: 96%   Body mass index is 40.60 kg/(m^2). GENERAL: The patient is a well-nourished female, in no acute distress. The vital signs are documented above. CARDIOVASCULAR: There is a regular rate and rhythm without significant murmur appreciated. She has a palpable right dorsalis pedis and right posterior tibial pulse. On the left side she has a brisk posterior tibial and dorsalis pedis signal with the Doppler. PULMONARY: There is good air exchange bilaterally without wheezing or rales. ABDOMEN: obese and difficult to assess. SKIN: There are no ulcers or rashes noted.  MEDICAL ISSUES:  Peripheral vascular disease, unspecified On exam the patient has palpable pedal pulses on the right with a brisk Doppler flow in the left foot. In addition she has an excellent Doppler flow in her fem-fem graft. Her main problem appears to be back pain. This is limiting her activities of currently I really do not get any history of significant claudication. Based on my exam I think her fem-fem graft is patent. It is somewhat difficult to assess because of her size. Regardless, she appears to have excellent perfusion bilaterally with no significant symptoms that I can attribute to peripheral vascular disease. I've encouraged her to continue to follow up with her primary care physician concerning her back pain which appears to be the bigger issue. I plan on seeing her back in one year with follow up ABIs which I have ordered for that time.   Damyiah Moxley S [facility]  [place]eeper: [phone]

## 2011-06-27 NOTE — Assessment & Plan Note (Signed)
On exam the patient has palpable pedal pulses on the right with a brisk Doppler flow in the left foot. In addition she has an excellent Doppler flow in her fem-fem graft. Her main problem appears to be back pain. This is limiting her activities of currently I really do not get any history of significant claudication. Based on my exam I think her fem-fem graft is patent. It is somewhat difficult to assess because of her size. Regardless, she appears to have excellent perfusion bilaterally with no significant symptoms that I can attribute to peripheral vascular disease. I've encouraged her to continue to follow up with her primary care physician concerning her back pain which appears to be the bigger issue. I plan on seeing her back in one year with follow up ABIs which I have ordered for that time.

## 2011-07-30 ENCOUNTER — Ambulatory Visit (INDEPENDENT_AMBULATORY_CARE_PROVIDER_SITE_OTHER): Payer: Medicare Other | Admitting: Pulmonary Disease

## 2011-07-30 ENCOUNTER — Encounter: Payer: Self-pay | Admitting: Pulmonary Disease

## 2011-07-30 VITALS — BP 118/68 | HR 92 | Temp 97.8°F | Ht 62.0 in | Wt 227.2 lb

## 2011-07-30 DIAGNOSIS — J449 Chronic obstructive pulmonary disease, unspecified: Secondary | ICD-10-CM

## 2011-07-30 DIAGNOSIS — R0600 Dyspnea, unspecified: Secondary | ICD-10-CM

## 2011-07-30 DIAGNOSIS — R0989 Other specified symptoms and signs involving the circulatory and respiratory systems: Secondary | ICD-10-CM

## 2011-07-30 NOTE — Progress Notes (Signed)
Chief Complaint  Patient presents with  . Follow-up    Pt states her breathing has been fine. Pt has a dry cough x  1 week, slight wheezing. denies any chest tx    History of Present Illness: [name] is a 71 y.o. female former smoker with dyspnea, and COPD with asthma.  Her breathing is doing better.  She gets occasional cough and wheeze.  This is mostly related to throat irritation.  She denies post-nasal drip or reflux.  She is using proair bid because she thought this is what she was supposed to do.   Past Medical History  Diagnosis Date  . Hypertension   . PAD (peripheral artery disease)   . Hypothyroidism   . Hypercholesteremia   . Obesity   . Shortness of breath   . Hyperthyroidism   . GERD (gastroesophageal reflux disease)   . Arthritis   . Diabetes mellitus   . Chronic obstructive asthma 05/28/2011  . COPD (chronic obstructive pulmonary disease)     Past Surgical History  Procedure Date  . Femoral-femoral bypass graft 05/09/2010    Right to Left Fem-Fem BPG by Dr. [name]  . Tubal ligation   . Descending aortic aneurysm repair w/ stent 12/12/10    Stent repair    Allergies  Allergen Reactions  . Glucophage (Metformin Hydrochloride) Itching and Rash    REPORTED BY PATIENT; STATES SYMPTOMS APPEARED AFTER THIRD DOES WITHIN THE LAST MONTH.  Marland Kitchen Alendronate Sodium Other (See Comments)    Unknown   . Avelox (Moxifloxacin Hcl In Nacl)     Itching,rash  . Latex     rash  . Metformin And Related   . Omnipaque (Iohexol) Other (See Comments)    Unknown   . Adhesive (Tape) Rash  . Amoxicillin Itching and Rash  . Cephalexin Itching and Rash  . Contrast Media (Iodinated Diagnostic Agents) Itching and Rash  . Plavix (Clopidogrel Bisulfate) Itching and Rash    Physical Exam:  Blood pressure 118/68, pulse 92, temperature 97.8 F (36.6 C), temperature source Oral, height 5\' 2"  (1.575 m), weight 227 lb 3.2 oz (103.057 kg), SpO2 93.00%.  Body mass index is  41.56 kg/(m^2).  Wt Readings from Last 2 Encounters:  07/30/11 227 lb 3.2 oz (103.057 kg)  06/27/11 222 lb (100.699 kg)    HEENT - No sinus tenderness, no oral exudate, no LAN  Cardiac - s1s2 regular, no murmur  Chest - prolonged exhalation, decreased breath sounds, no wheeze/rales/dullness  Abdomen - obese, soft, nontender  Extremities - no e/c/c  Neurologic - normals strength Skin - no rashes  Psychiatric - normal mood, behavior   Assessment/Plan:  Outpatient Encounter Prescriptions as of 07/30/2011  Medication Sig Dispense Refill  . albuterol (PROAIR HFA) 108 (90 BASE) MCG/ACT inhaler Inhale 2 puffs into the lungs every 6 (six) hours as needed for wheezing.  1 Inhaler  5  . aspirin 81 MG chewable tablet Chew 81 mg by mouth daily.        . Calcium-Magnesium-Vitamin D (CALCIUM MAGNESIUM PO) Take by mouth 2 (two) times daily.        . carvedilol (COREG) 6.25 MG tablet 2 tablets .twice a day      . Cholecalciferol (VITAMIN D) 2000 UNITS tablet Take 2,000 Units by mouth daily.       Marland Kitchen levothyroxine (SYNTHROID, LEVOTHROID) 75 MCG tablet Take 75 mcg by mouth daily.      . mometasone-formoterol (DULERA) 100-5 MCG/ACT AERO Inhale 2 puffs into the  lungs 2 (two) times daily.  1 Inhaler  5  . omeprazole (PRILOSEC) 40 MG capsule Take 40 mg by mouth daily.        . sitaGLIPtin (JANUVIA) 100 MG tablet Take 100 mg by mouth daily.      Marland Kitchen Spacer/Aero-Holding Chambers (AEROCHAMBER MV) inhaler Use as instructed  1 each  0    Chelse Matas Pager:  [phone] 07/30/2011, 11:00 AM

## 2011-07-30 NOTE — Assessment & Plan Note (Signed)
This is likely multifactorial.  She extensive history of smoking.  Chest xray and PFT's are consistent with chronic obstructive asthma.  This is improved with dulera.  She has history of hypertension, diastolic dysfunction, and peripheral vascular disease.  These are being managed by Dr. Gery Pray with cardiology and Dr. [name] with vascular surgery.  I suspect her dyspnea is also related to her weight and deconditioning.  Will need to discuss option of pulmonary rehab.

## 2011-07-30 NOTE — Patient Instructions (Addendum)
Dulera two puffs twice per day, and rinse mouth after each use Proair two puffs as needed for cough, wheeze, or chest congestion Try using salt water gargle and sip of water for throat irritation Follow up in 6 months

## 2011-07-30 NOTE — Assessment & Plan Note (Signed)
She is doing better since she started [place].  She is to continue this.  Advised that she can use proair as needed.  Advised her to use salt water gargles and take sips of water if she has throat irritation.

## 2011-08-17 ENCOUNTER — Ambulatory Visit
Admission: RE | Admit: 2011-08-17 | Discharge: 2011-08-17 | Disposition: A | Discharge status: Home / Self Care | Payer: Medicare Other | Source: Ambulatory Visit | Attending: Endocrinology | Admitting: Endocrinology

## 2011-08-17 DIAGNOSIS — E049 Nontoxic goiter, unspecified: Secondary | ICD-10-CM

## 2011-09-12 ENCOUNTER — Encounter (HOSPITAL_COMMUNITY): Payer: Self-pay | Admitting: *Deleted

## 2011-09-12 ENCOUNTER — Emergency Department (HOSPITAL_COMMUNITY)
Admission: EM | Admit: 2011-09-12 | Discharge: 2011-09-12 | Disposition: A | Discharge status: Home / Self Care | Payer: Medicare Other | Attending: Emergency Medicine | Admitting: Emergency Medicine

## 2011-09-12 DIAGNOSIS — M129 Arthropathy, unspecified: Secondary | ICD-10-CM | POA: Insufficient documentation

## 2011-09-12 DIAGNOSIS — Z87891 Personal history of nicotine dependence: Secondary | ICD-10-CM | POA: Insufficient documentation

## 2011-09-12 DIAGNOSIS — T161XXA Foreign body in right ear, initial encounter: Secondary | ICD-10-CM

## 2011-09-12 DIAGNOSIS — J449 Chronic obstructive pulmonary disease, unspecified: Secondary | ICD-10-CM | POA: Insufficient documentation

## 2011-09-12 DIAGNOSIS — E669 Obesity, unspecified: Secondary | ICD-10-CM | POA: Insufficient documentation

## 2011-09-12 DIAGNOSIS — J4489 Other specified chronic obstructive pulmonary disease: Secondary | ICD-10-CM | POA: Insufficient documentation

## 2011-09-12 DIAGNOSIS — I10 Essential (primary) hypertension: Secondary | ICD-10-CM | POA: Insufficient documentation

## 2011-09-12 DIAGNOSIS — E039 Hypothyroidism, unspecified: Secondary | ICD-10-CM | POA: Insufficient documentation

## 2011-09-12 DIAGNOSIS — E7800 Pure hypercholesterolemia, unspecified: Secondary | ICD-10-CM | POA: Insufficient documentation

## 2011-09-12 DIAGNOSIS — K219 Gastro-esophageal reflux disease without esophagitis: Secondary | ICD-10-CM | POA: Insufficient documentation

## 2011-09-12 DIAGNOSIS — H6120 Impacted cerumen, unspecified ear: Secondary | ICD-10-CM | POA: Insufficient documentation

## 2011-09-12 DIAGNOSIS — E119 Type 2 diabetes mellitus without complications: Secondary | ICD-10-CM | POA: Insufficient documentation

## 2011-09-12 NOTE — ED Notes (Signed)
Pt reports she was scratching the inside of her rt ear canal w/ the end of a pen and was concerned part of the pen broke off inside of her ear. Small fragment of plastic noted to ear canal - removed w/ curette w/o difficulty. Pt denies any pain at present.

## 2011-09-12 NOTE — ED Provider Notes (Signed)
History     CSN: [id number]  Arrival date & time 09/12/11  0131   First MD Initiated Contact with Patient 09/12/11 0217      Chief Complaint  Patient presents with  . Foreign Body in Ear   ) The history is provided by the patient.   the patient reports she was itching inside of her right ear with pain when a piece of the pin broke off.  She presents the emergency room for evaluation.  In triage the nurse was able to remove the plastic foreign body from the right ear canal.  The patient reports no pain in her right ear.  No difficulty hearing.  No other complaints.  Past Medical History  Diagnosis Date  . Hypertension   . PAD (peripheral artery disease)   . Hypothyroidism   . Hypercholesteremia   . Obesity   . Shortness of breath   . Hyperthyroidism   . GERD (gastroesophageal reflux disease)   . Arthritis   . Diabetes mellitus   . Chronic obstructive asthma 05/28/2011  . COPD (chronic obstructive pulmonary disease)     Past Surgical History  Procedure Date  . Femoral-femoral bypass graft 05/09/2010    Right to Left Fem-Fem BPG by Dr. [name]  . Tubal ligation   . Descending aortic aneurysm repair w/ stent 12/12/10    Stent repair    Family History  Problem Relation Age of Onset  . Diabetes Mother   . Heart disease Mother   . Hyperlipidemia Mother   . Cancer Sister   . Allergies Daughter     x3    History  Substance Use Topics  . Smoking status: Former Smoker -- 2.0 packs/day for 47 years    Types: Cigarettes    Quit date: 01/17/2010  . Smokeless tobacco: Never Used  . Alcohol Use: No    OB History    Grav Para Term Preterm Abortions TAB SAB Ect Mult Living                  Review of Systems  All other systems reviewed and are negative.    Allergies  Glucophage; Alendronate sodium; Avelox; Latex; Metformin and related; Omnipaque; Adhesive; Amoxicillin; Cephalexin; Contrast media; and Plavix  Home Medications   Current Outpatient Rx  Name Route  Sig Dispense Refill  . ALBUTEROL SULFATE HFA 108 (90 BASE) MCG/ACT IN AERS Inhalation Inhale 2 puffs into the lungs every 6 (six) hours as needed for wheezing. 1 Inhaler 5  . ASPIRIN 81 MG PO CHEW Oral Chew 81 mg by mouth daily.      Marland Kitchen CALCIUM MAGNESIUM PO Oral Take by mouth 2 (two) times daily.      Marland Kitchen CARVEDILOL 6.25 MG PO TABS  2 tablets .twice a day    . VITAMIN D 2000 UNITS PO TABS Oral Take 2,000 Units by mouth daily.     Marland Kitchen LEVOTHYROXINE SODIUM 75 MCG PO TABS Oral Take 75 mcg by mouth daily.    . MOMETASONE FURO-FORMOTEROL FUM 100-5 MCG/ACT IN AERO Inhalation Inhale 2 puffs into the lungs 2 (two) times daily. 1 Inhaler 5  . OMEPRAZOLE 40 MG PO CPDR Oral Take 40 mg by mouth daily.      Marland Kitchen SITAGLIPTIN PHOSPHATE 100 MG PO TABS Oral Take 100 mg by mouth daily.    Ival Bible MV MISC  Use as instructed 1 each 0    BP 156/55  Pulse 86  Temp 98 F (36.7 C) (Oral)  Resp  16  SpO2 96%  Physical Exam  Constitutional: She is oriented to person, place, and time. She appears well-developed and well-nourished.  HENT:  Head: Normocephalic.       Bilateral external artery canals are impacted with cerumen.  No foreign bodies noted in her right ear.  Eyes: EOM are normal.  Neck: Normal range of motion.  Pulmonary/Chest: Effort normal.  Abdominal: She exhibits no distension.  Musculoskeletal: Normal range of motion.  Neurological: She is alert and oriented to person, place, and time.  Psychiatric: She has a normal mood and affect.    ED Course  Procedures (including critical care time)  Labs Reviewed - No data to display No results found.   1. Foreign body of right ear       MDM  No foreign body seen.  The patient has impacted cerumen.  I was unable to visualize the TM on the right.  I attempted to remove this from it at the bedside that the patient could not tolerated.  I do not visualize any foreign body.  ENT followup.        Lyanne Co, MD 09/12/11 [phone]

## 2011-11-15 IMAGING — CR DG CHEST 1V PORT
1 series · 1 of 1 positions shown · non-contrast
Comparison: Chest radiograph 05/15/2010

CLINICAL DATA: Verify PICC placement.

PORTABLE CHEST - 1 VIEW

[AP]
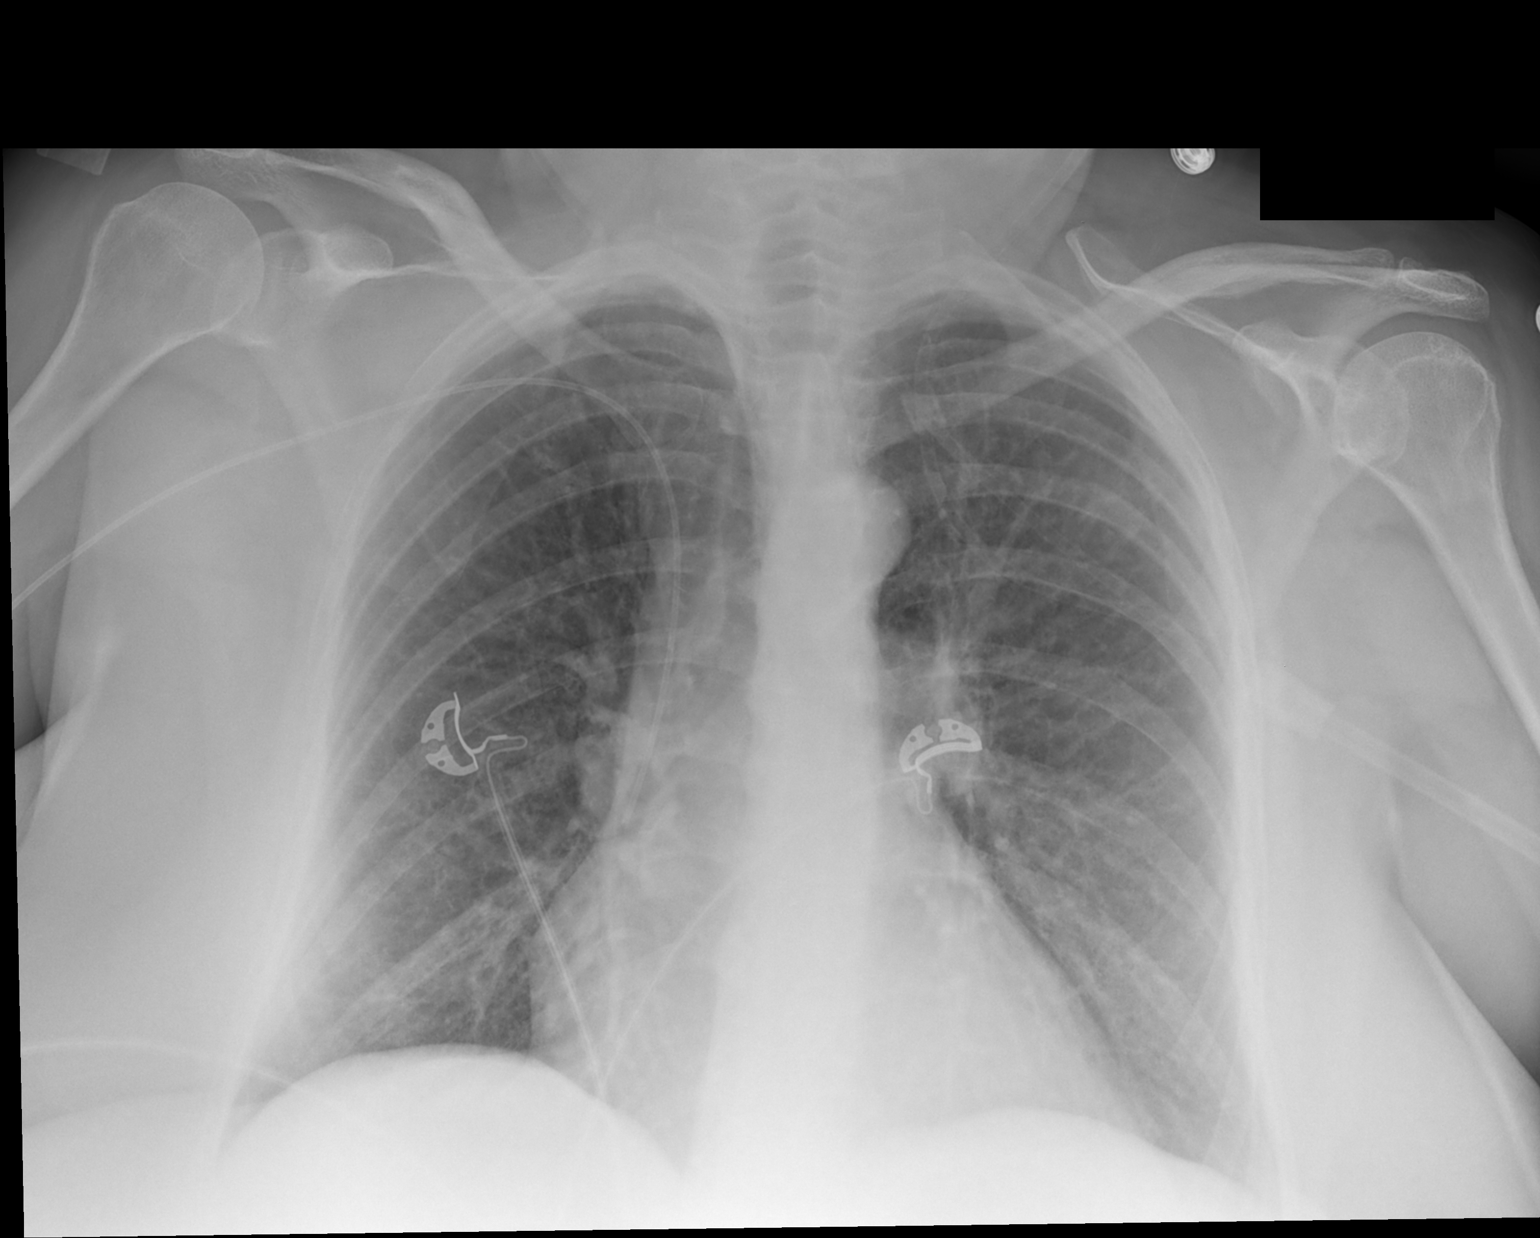

[1 of 1 positions shown; findings below may reference images not displayed]

FINDINGS: Right upper extremity PICC is in satisfactory position,
with the tip in the distal superior vena cava.  Stable heart and
mediastinal contours.  Low lung volumes.  No airspace disease,
effusion, or pneumothorax.  No significant change in aeration.
IMPRESSION: Right upper extremity PICC in satisfactory position.  Distal tip is
in the distal superior vena cava.

## 2011-11-29 ENCOUNTER — Other Ambulatory Visit (HOSPITAL_COMMUNITY): Payer: Self-pay | Admitting: Cardiovascular Disease

## 2011-11-29 DIAGNOSIS — I739 Peripheral vascular disease, unspecified: Secondary | ICD-10-CM

## 2011-12-10 ENCOUNTER — Ambulatory Visit (HOSPITAL_COMMUNITY)
Admission: RE | Admit: 2011-12-10 | Discharge: 2011-12-10 | Disposition: A | Discharge status: Home / Self Care | Payer: Medicare Other | Source: Ambulatory Visit | Attending: Cardiovascular Disease | Admitting: Cardiovascular Disease

## 2011-12-10 DIAGNOSIS — I70219 Atherosclerosis of native arteries of extremities with intermittent claudication, unspecified extremity: Secondary | ICD-10-CM

## 2011-12-10 NOTE — Progress Notes (Signed)
LEA Doppler Complete. Occluded fem-fem is noted. [name]

## 2012-01-30 ENCOUNTER — Other Ambulatory Visit: Payer: PRIVATE HEALTH INSURANCE

## 2012-01-30 ENCOUNTER — Ambulatory Visit: Payer: PRIVATE HEALTH INSURANCE | Admitting: Vascular Surgery

## 2012-02-20 ENCOUNTER — Other Ambulatory Visit: Payer: Self-pay | Admitting: Neurosurgery

## 2012-02-20 DIAGNOSIS — M549 Dorsalgia, unspecified: Secondary | ICD-10-CM

## 2012-02-24 ENCOUNTER — Ambulatory Visit
Admission: RE | Admit: 2012-02-24 | Discharge: 2012-02-24 | Disposition: A | Discharge status: Home / Self Care | Payer: Medicare Other | Source: Ambulatory Visit | Attending: Neurosurgery | Admitting: Neurosurgery

## 2012-02-24 DIAGNOSIS — M549 Dorsalgia, unspecified: Secondary | ICD-10-CM

## 2012-04-03 ENCOUNTER — Other Ambulatory Visit: Payer: Self-pay | Admitting: Endocrinology

## 2012-04-03 DIAGNOSIS — E049 Nontoxic goiter, unspecified: Secondary | ICD-10-CM

## 2012-04-03 IMAGING — US US SOFT TISSUE HEAD/NECK
1 series · 14 of 25 positions shown · non-contrast
Comparison: None.

CLINICAL DATA: Goiter

THYROID ULTRASOUND
TECHNIQUE: Ultrasound examination of the thyroid gland and adjacent
soft tissues was performed.

[Series 1: us soft tissue head/neck · 0.07mm/px · 14 of 42 slices shown]
[im 1/42]
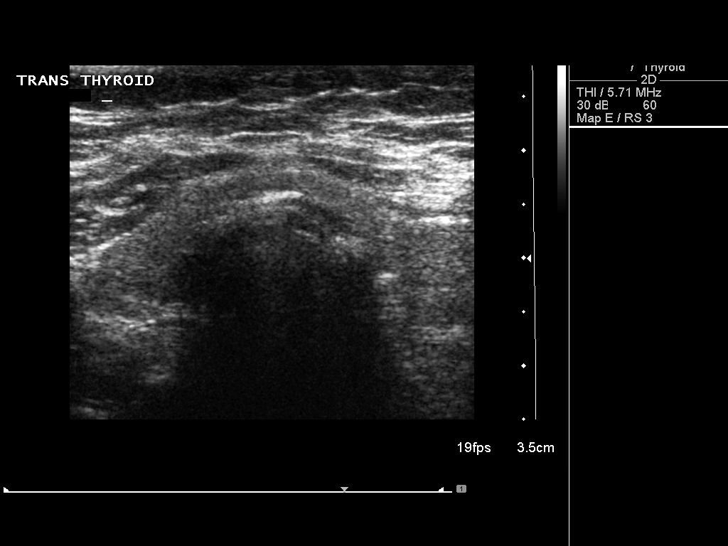
[im 4/42]
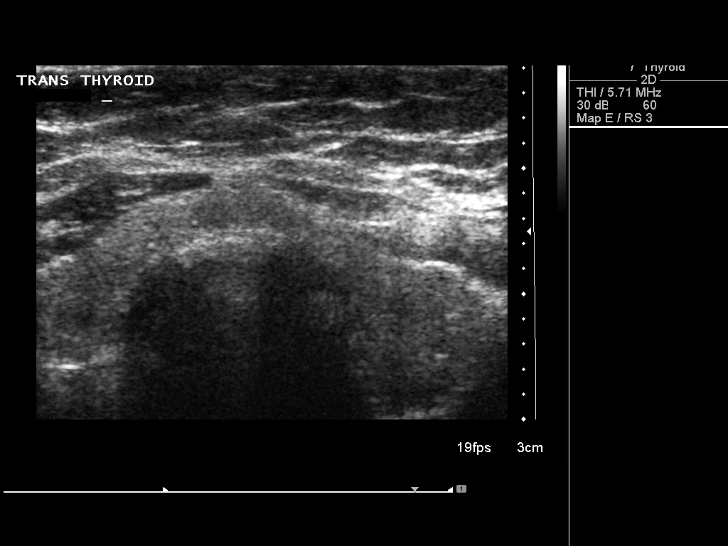
[im 7/42]
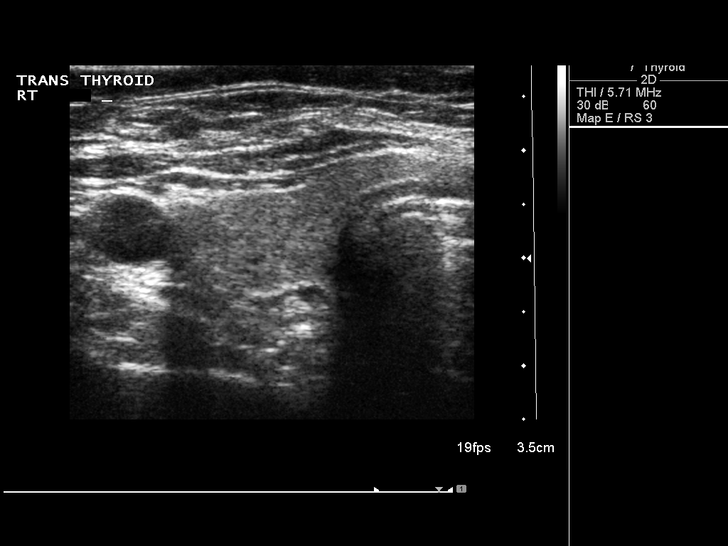
[im 11/42]
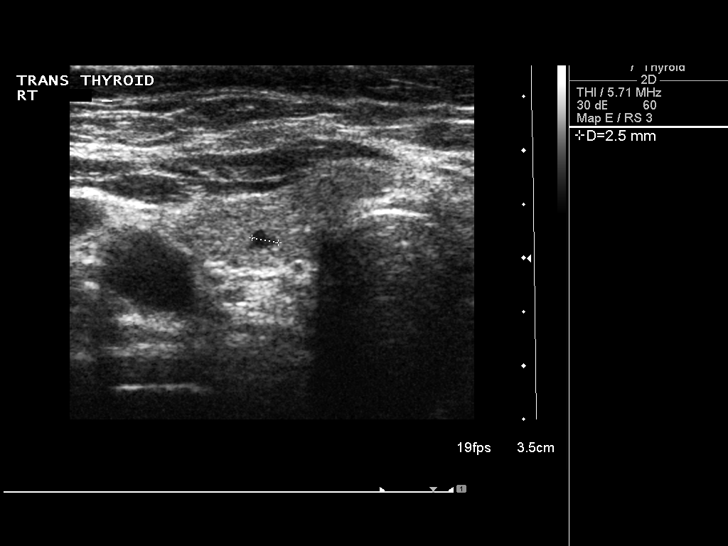
[im 14/42]
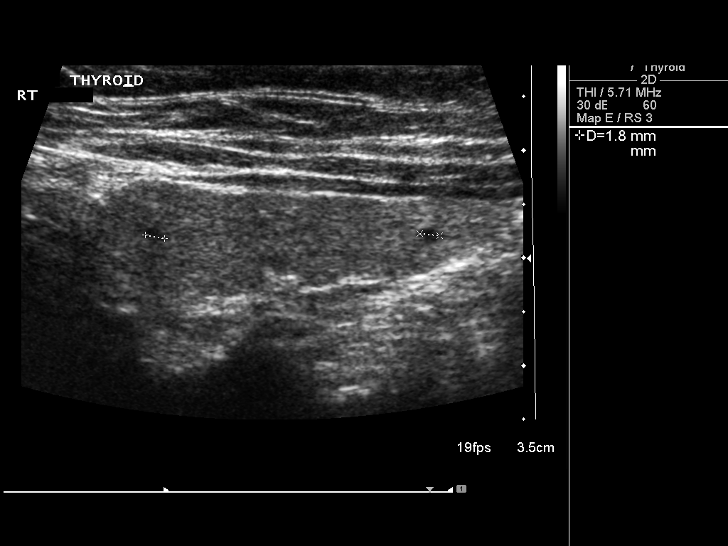
[im 16/42]
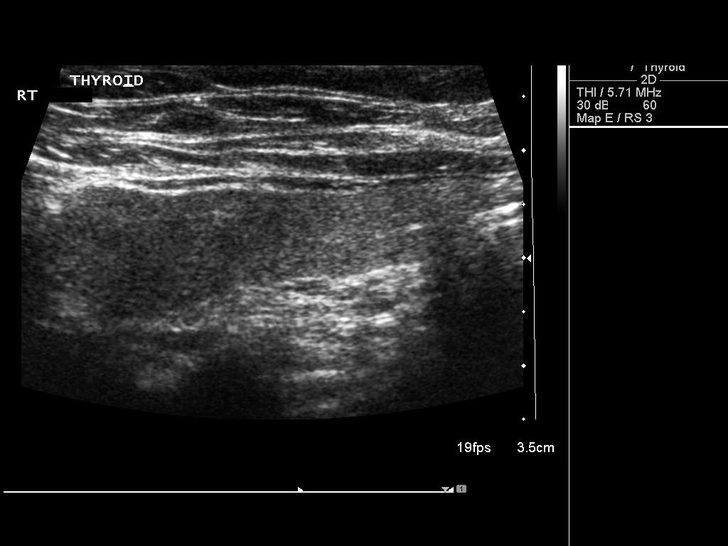
[im 19/42]
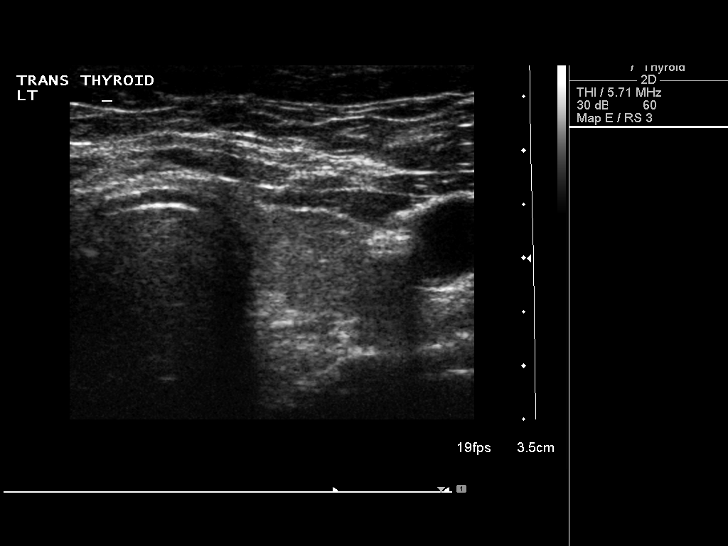
[im 23/42]
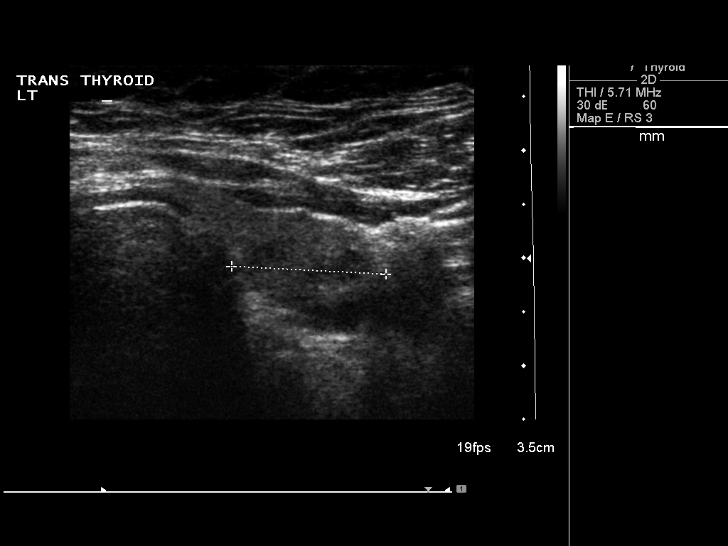
[im 26/42]
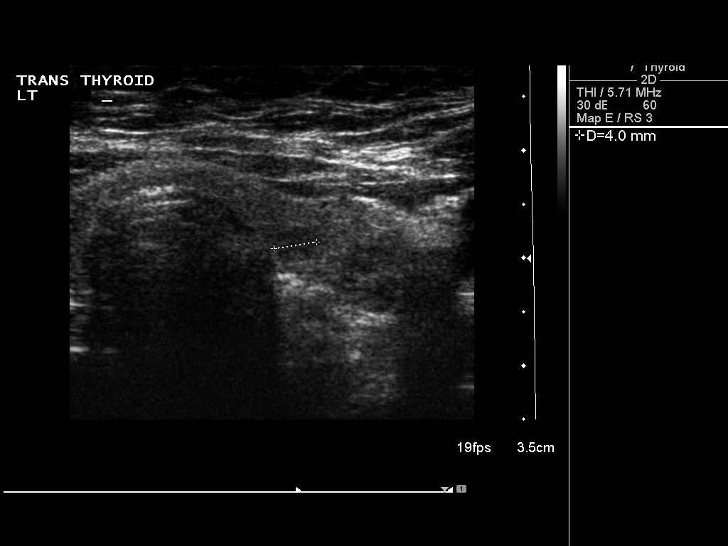
[im 28/42]
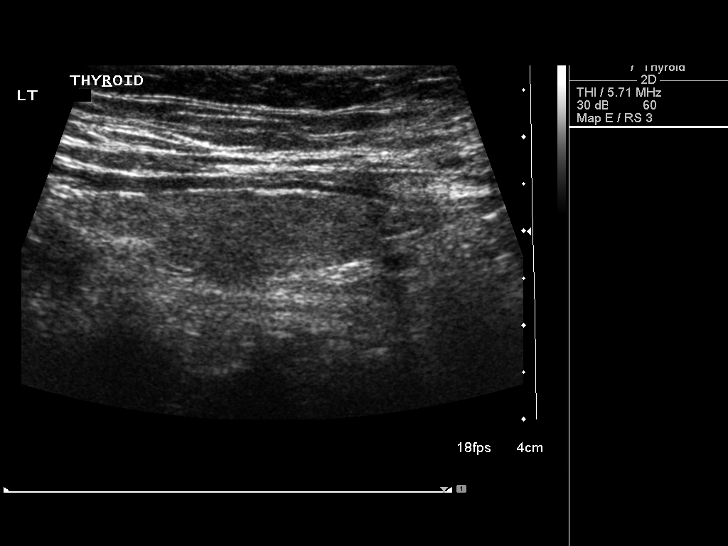
[im 31/42]
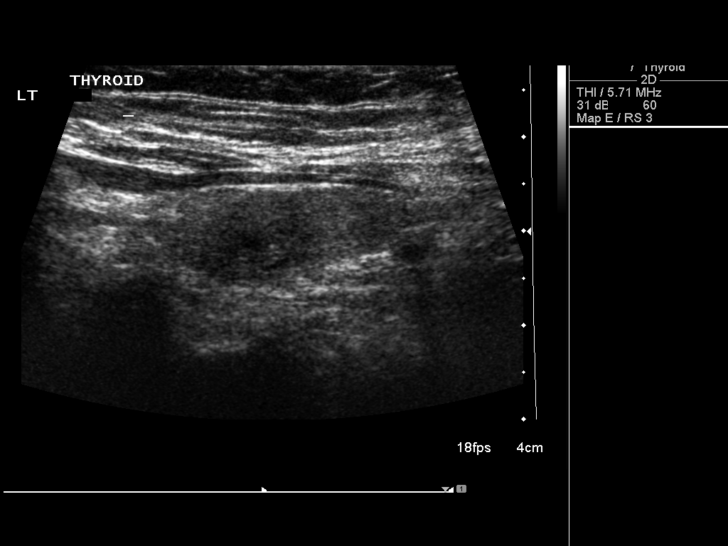
[im 35/42]
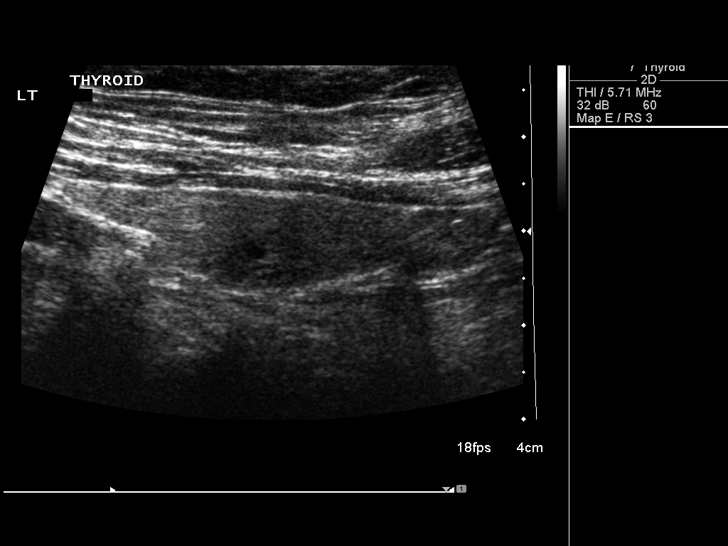
[im 38/42]
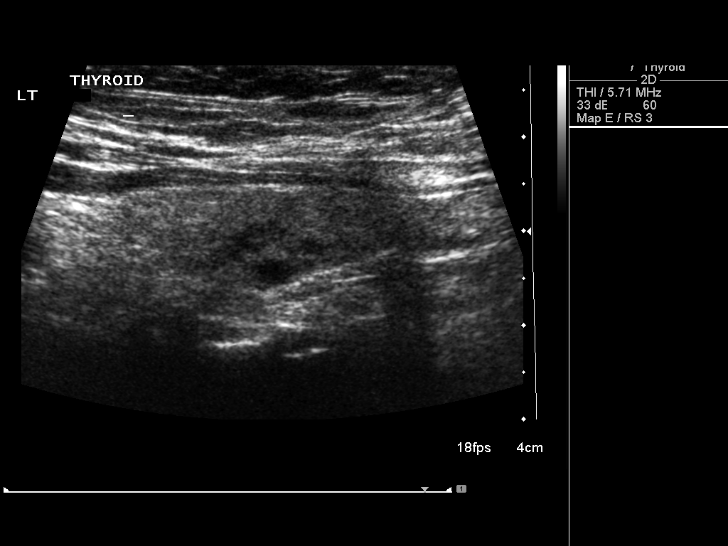
[im 42/42]
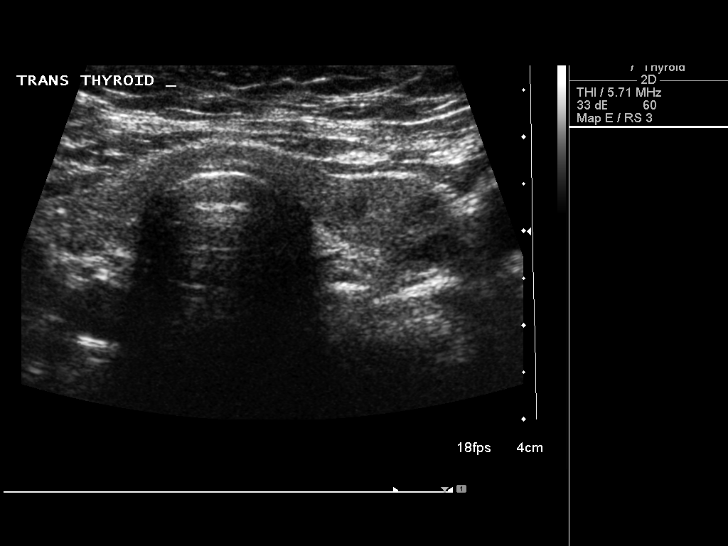

[14 of 25 positions shown; findings below may reference images not displayed]

FINDINGS: Right thyroid lobe:  11 x 14 x 41 mm, homogeneous in echotexture
Left thyroid lobe:  11 x 14 x 42 mm
Isthmus:  4.1 mm in thickness

Focal nodules:  8 x 8 x 11 mm hypoechoic, inferior left
There are at least three additional small nodules bilaterally
measuring 5 mm or less.

Lymphadenopathy:  None visualized.
IMPRESSION: 1. Bilateral small thyroid nodules.  None meets consensus criteria
for biopsy.  Follow up should be based on clinical parameters. This
recommendation follows the consensus statement:  Management of
Thyroid Nodules Detected as US:  Society of [employer]iologists in
800.  Available online at :
[URL]

## 2012-04-03 IMAGING — CR DG HIP (WITH OR WITHOUT PELVIS) 2-3V*L*
2 series · 2 of 2 positions shown · non-contrast
Comparison: None

CLINICAL DATA: Chronic hip pain

LEFT HIP - COMPLETE 2+ VIEW

[t hip ap left]
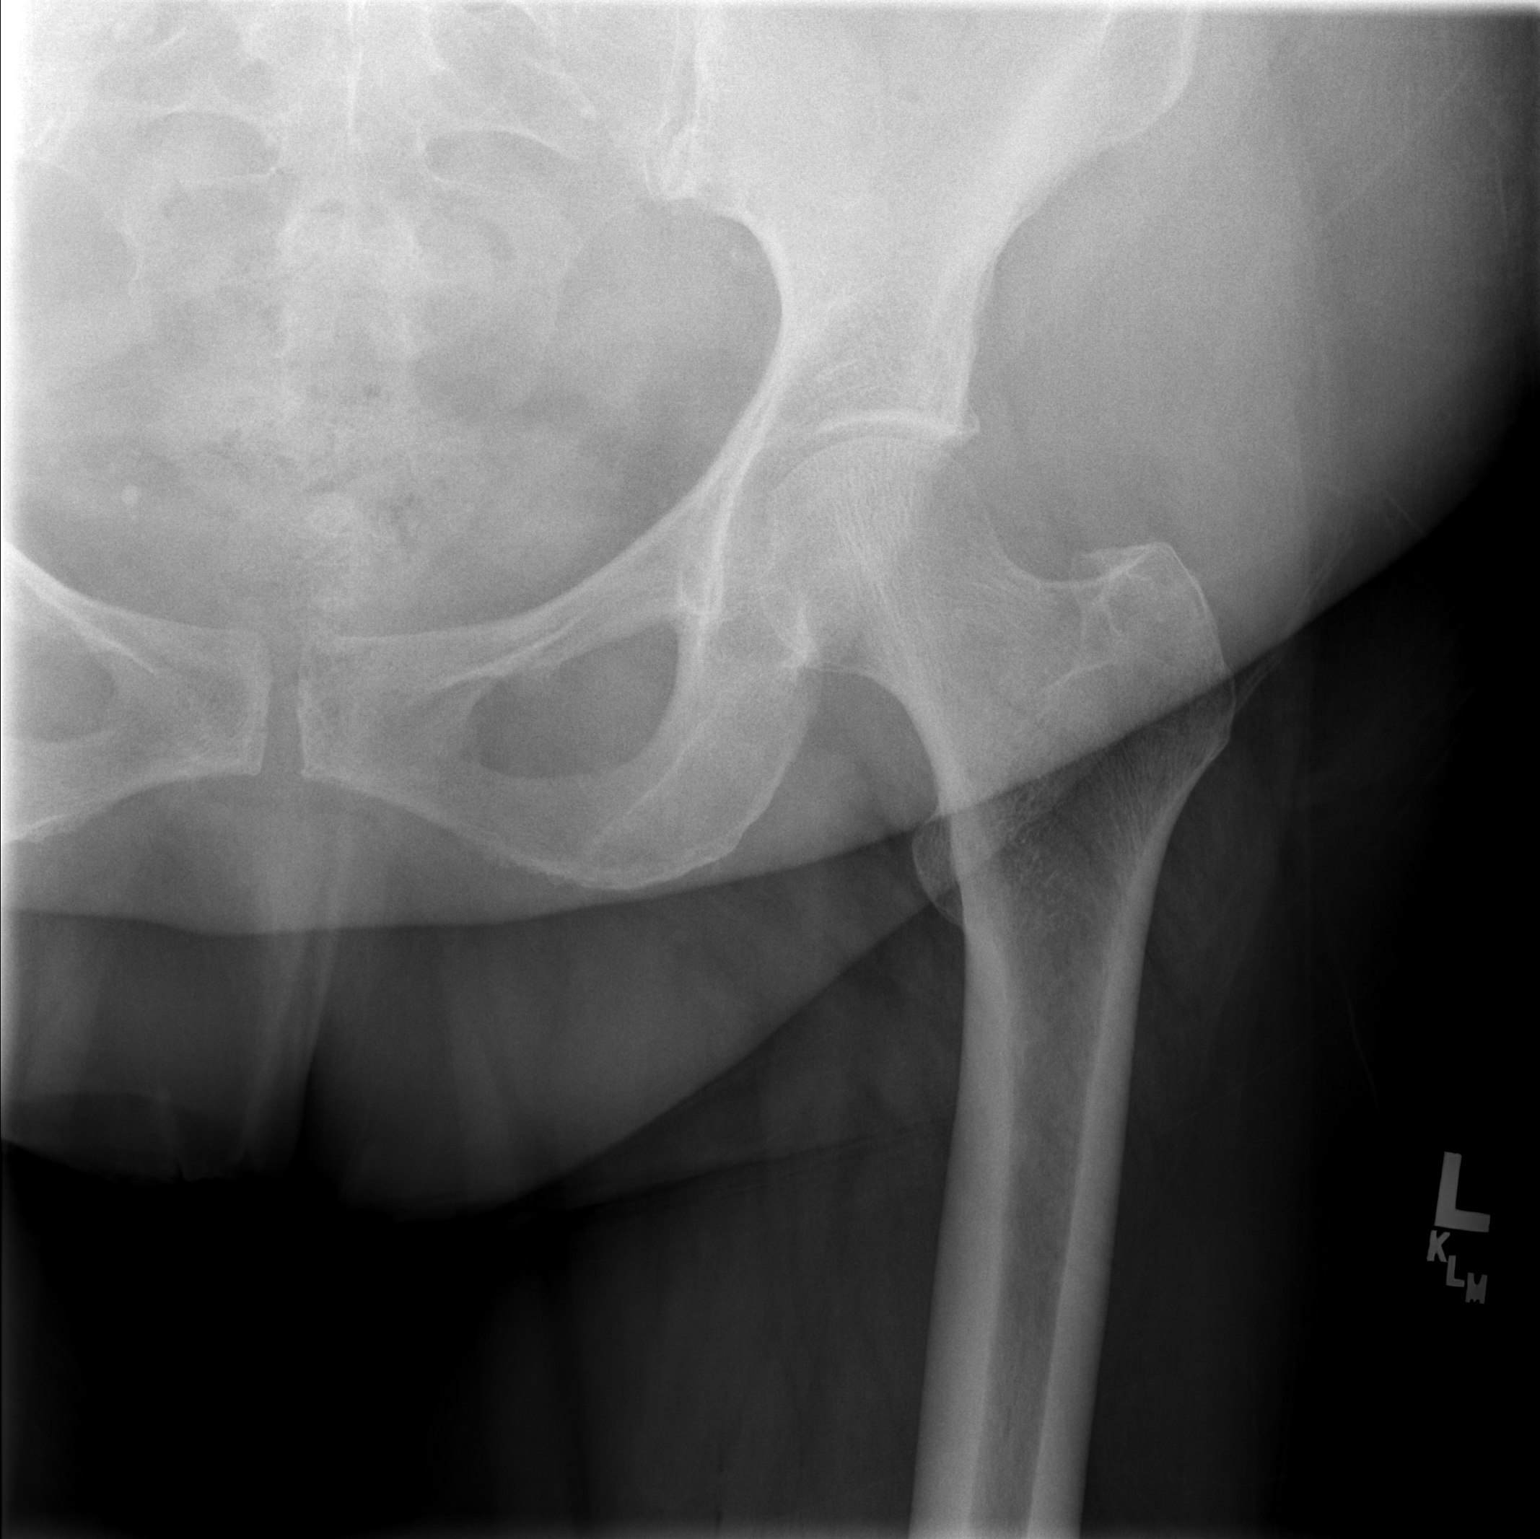

[t hip frog leg left]
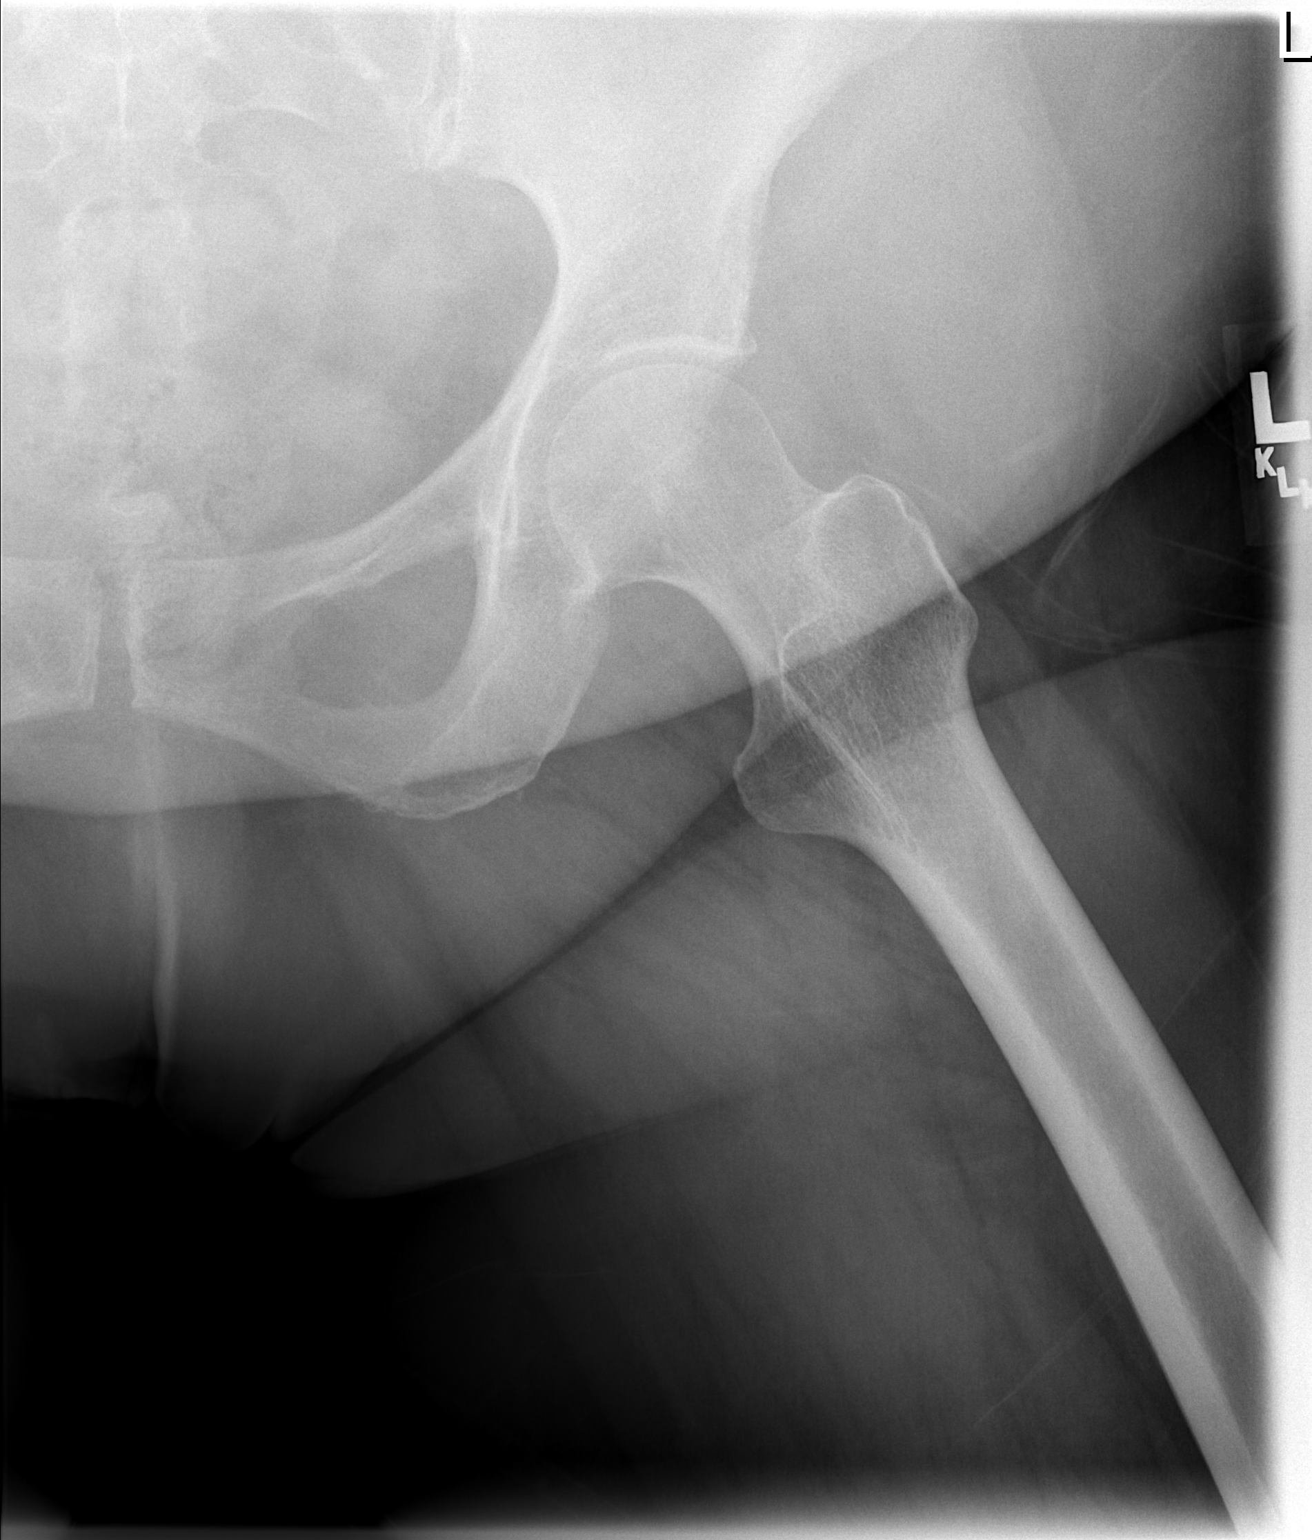

[2 of 2 positions shown; findings below may reference images not displayed]

FINDINGS: Normal alignment and no fracture.  Negative for AVN.  No
significant joint space narrowing or degenerative change.
IMPRESSION: Negative left hip

## 2012-06-18 ENCOUNTER — Ambulatory Visit: Payer: Medicare Other | Admitting: Cardiology

## 2012-06-25 ENCOUNTER — Encounter (INDEPENDENT_AMBULATORY_CARE_PROVIDER_SITE_OTHER): Payer: PRIVATE HEALTH INSURANCE | Admitting: *Deleted

## 2012-06-25 ENCOUNTER — Ambulatory Visit: Payer: Medicare Other | Admitting: Neurosurgery

## 2012-06-25 ENCOUNTER — Encounter: Payer: Self-pay | Admitting: Cardiovascular Disease

## 2012-06-25 DIAGNOSIS — Z48812 Encounter for surgical aftercare following surgery on the circulatory system: Secondary | ICD-10-CM

## 2012-06-25 DIAGNOSIS — I739 Peripheral vascular disease, unspecified: Secondary | ICD-10-CM

## 2012-06-25 DIAGNOSIS — I70219 Atherosclerosis of native arteries of extremities with intermittent claudication, unspecified extremity: Secondary | ICD-10-CM

## 2012-07-04 ENCOUNTER — Ambulatory Visit: Payer: Medicare Other | Admitting: Cardiology

## 2012-07-04 ENCOUNTER — Ambulatory Visit: Payer: PRIVATE HEALTH INSURANCE | Admitting: Cardiology

## 2012-07-05 ENCOUNTER — Encounter: Payer: Self-pay | Admitting: Cardiovascular Disease

## 2012-07-29 ENCOUNTER — Encounter: Payer: Self-pay | Admitting: Vascular Surgery

## 2012-07-30 ENCOUNTER — Other Ambulatory Visit: Payer: Self-pay | Admitting: *Deleted

## 2012-07-30 ENCOUNTER — Encounter: Payer: Self-pay | Admitting: Vascular Surgery

## 2012-07-30 ENCOUNTER — Ambulatory Visit (INDEPENDENT_AMBULATORY_CARE_PROVIDER_SITE_OTHER): Payer: PRIVATE HEALTH INSURANCE | Admitting: Vascular Surgery

## 2012-07-30 VITALS — BP 150/88 | HR 67 | Ht 62.0 in | Wt 246.5 lb

## 2012-07-30 DIAGNOSIS — I739 Peripheral vascular disease, unspecified: Secondary | ICD-10-CM

## 2012-07-30 DIAGNOSIS — I7389 Other specified peripheral vascular diseases: Secondary | ICD-10-CM

## 2012-07-30 DIAGNOSIS — Z48812 Encounter for surgical aftercare following surgery on the circulatory system: Secondary | ICD-10-CM

## 2012-07-30 NOTE — Assessment & Plan Note (Signed)
I suspect that her right to left fem-fem graft is occluded although I do not know this for sure. If this is occluded then is occluded suddenly at some point in the past. Regardless she has minimal claudication symptoms in the left leg in her activity is mostly limited by her back issues. Shortly she is not a smoker. I've encouraged her to stay as active as possible as her back allows. We discussed potentially getting involved with water aerobics. I'll plan on seeing her back in one year with follow up ABIs. Given that we do not know for sure that her graft is occluded we'll have him take a quick look at her graft to see if that is patent or not for future reference. Certainly she would be at very high risk for infection if she underwent a redo fem-fem bypass graft given her obesity. She knows to call sooner if she has problems.

## 2012-07-30 NOTE — Progress Notes (Signed)
[facility]  Patient name: [name] MRN: [id number] DOB: 1940/04/05 Sex: female  REASON FOR VISIT: follow up of peripheral vascular disease.  HPI: [name] is a 72 y.o. female who underwent a right to left fem-fem bypass graft in 2012. I last saw her in June of 2013, a year ago. At that time she had fairly brisk Doppler signal in her fem-fem graft. She had a previous study at an outlying lab which suggested that her graft was occluded, however based on her Doppler signals I obtained at the graft was likely patent. Regardless genital claudication symptoms and would simply been following her. Since I saw her last remaining she's been low back pain which is limited her activity. She does experience some claudication in the left calf which is brought on by ambulation and relieved with rest. She's had no claudication on the right side. She denies any rest pain or nonhealing ulcers.  Past Medical History  Diagnosis Date  . Hypertension   . PAD (peripheral artery disease)   . Hypothyroidism   . Hypercholesteremia   . Obesity   . Shortness of breath   . Hyperthyroidism   . GERD (gastroesophageal reflux disease)   . Arthritis   . Diabetes mellitus   . Chronic obstructive asthma 05/28/2011  . COPD (chronic obstructive pulmonary disease)   . Claudication     LEA DOPPLER, 12/10/2011 - FEMORAL-FEMORAL BYPASS GRAFT-occluded; LEFT TIBIAL-appeared occluded;   . DOE (dyspnea on exertion)     NUCLEAR STRESS TEST, 08/03/2010 - EKG negative for ischemia  . SOB (shortness of breath)     2D ECHO, 03/01/2011 - EF 65-70%, moderate LVH,    Family History  Problem Relation Age of Onset  . Diabetes Mother   . Heart disease Mother   . Hyperlipidemia Mother   . Cancer Sister   . Allergies Daughter     x3   SOCIAL HISTORY: History  Substance Use Topics  . Smoking status: Former Smoker -- 2.00 packs/day for 47 years    Types: Cigarettes    Quit date: 01/17/2010  .  Smokeless tobacco: Never Used  . Alcohol Use: No   Allergies  Allergen Reactions  . Glucophage (Metformin Hydrochloride) Itching and Rash    REPORTED BY PATIENT; STATES SYMPTOMS APPEARED AFTER THIRD DOES WITHIN THE LAST MONTH.  Marland Kitchen Alendronate Sodium Other (See Comments)    Unknown   . Avelox (Moxifloxacin Hcl In Nacl)     Itching,rash  . Latex     rash  . Metformin And Related   . Omnipaque (Iohexol) Other (See Comments)    Unknown   . Adhesive (Tape) Rash  . Amoxicillin Itching and Rash  . Cephalexin Itching and Rash  . Contrast Media (Iodinated Diagnostic Agents) Itching and Rash  . Plavix (Clopidogrel Bisulfate) Itching and Rash   Current Outpatient Prescriptions  Medication Sig Dispense Refill  . aspirin 81 MG chewable tablet Chew 81 mg by mouth daily.        . Calcium-Magnesium-Vitamin D (CALCIUM MAGNESIUM PO) Take by mouth 2 (two) times daily.        . carvedilol (COREG) 6.25 MG tablet Take 12.5 mg by mouth 2 (two) times daily with a meal. 2 tablets .twice a day      . Cholecalciferol (VITAMIN D) 2000 UNITS tablet Take 2,000 Units by mouth daily.       Marland Kitchen glimepiride (AMARYL) 2 MG tablet Take 2 mg by mouth daily before breakfast.      .  HYDRALAZINE HCL PO Take by mouth as needed.      Marland Kitchen levothyroxine (SYNTHROID, LEVOTHROID) 88 MCG tablet Take 1 tablet by mouth as needed.      Marland Kitchen lisinopril-hydrochlorothiazide (PRINZIDE,ZESTORETIC) 10-12.5 MG per tablet Take 1 tablet by mouth daily.      . mometasone-formoterol (DULERA) 100-5 MCG/ACT AERO Inhale 2 puffs into the lungs 2 (two) times daily.  1 Inhaler  5  . omeprazole (PRILOSEC) 40 MG capsule Take 40 mg by mouth daily.        . tizanidine (ZANAFLEX) 2 MG capsule Take by mouth as needed. Take one or two tablets per day prn      . Travoprost, BAK Free, (TRAVATAN) 0.004 % SOLN ophthalmic solution 1 drop at bedtime.      Marland Kitchen albuterol (PROAIR HFA) 108 (90 BASE) MCG/ACT inhaler Inhale 2 puffs into the lungs every 6 (six) hours as  needed for wheezing.  1 Inhaler  5  . levothyroxine (SYNTHROID, LEVOTHROID) 75 MCG tablet Take 75 mcg by mouth daily.      . sitaGLIPtin (JANUVIA) 100 MG tablet Take 100 mg by mouth daily.      . valsartan-hydrochlorothiazide (DIOVAN-HCT) 160-12.5 MG per tablet Take 1 tablet by mouth daily.       No current facility-administered medications for this visit.   REVIEW OF SYSTEMS: [name] ] denotes positive finding; [  ] denotes negative finding  CARDIOVASCULAR:  [ ]  chest pain   [ ]  chest pressure   [ ]  palpitations   [ ]  orthopnea   [name] ] dyspnea on exertion   [X]  claudication   [ ]  rest pain   [ ]  DVT   [ ]  phlebitis INTEGUMENTARY:  [ ]  rashes  [ ]  ulcers CONSTITUTIONAL:  [ ]  fever   [ ]  chills  PHYSICAL EXAM: Filed Vitals:   07/30/12 1023  BP: 150/88  Pulse: 67  Height: 5\' 2"  (1.575 m)  Weight: 246 lb 8 oz (111.812 kg)  SpO2: 97%   Body mass index is 45.07 kg/(m^2). GENERAL: The patient is a well-nourished female, in no acute distress. The vital signs are documented above. CARDIOVASCULAR: There is a regular rate and rhythm. I do not detect carotid bruits. She has a palpable right femoral pulse. I cannot palpate a left femoral pulse area both feet appear adequately perfused without ischemic ulcers. She has no significant lower extremity swelling. PULMONARY: There is good air exchange bilaterally without wheezing or rales. ABDOMEN: Soft and non-tender with normal pitched bowel sounds.  MUSCULOSKELETAL: There are no major deformities or cyanosis. NEUROLOGIC: No focal weakness or paresthesias are detected. SKIN: There are no ulcers or rashes noted. PSYCHIATRIC: The patient has a normal affect.  DATA:  Reviewed her Doppler study from June of this year which showed triphasic Doppler signals in the right foot with an ABI of 85%. She had an ABI of 47% on the left monophasic signals.  MEDICAL ISSUES: PVD WITH CLAUDICATION I suspect that her right to left fem-fem graft is occluded although I do  not know this for sure. If this is occluded then is occluded suddenly at some point in the past. Regardless she has minimal claudication symptoms in the left leg in her activity is mostly limited by her back issues. Shortly she is not a smoker. I've encouraged her to stay as active as possible as her back allows. We discussed potentially getting involved with water aerobics. I'll plan on seeing her back in one year with follow up ABIs.  Given that we do not know for sure that her graft is occluded we'll have him take a quick look at her graft to see if that is patent or not for future reference. Certainly she would be at very high risk for infection if she underwent a redo fem-fem bypass graft given her obesity. She knows to call sooner if she has problems.     DICKSON,CHRISTOPHER S [facility] [place] Beeper: [phone]

## 2012-08-20 ENCOUNTER — Other Ambulatory Visit (HOSPITAL_COMMUNITY): Payer: Self-pay | Admitting: Internal Medicine

## 2012-08-20 DIAGNOSIS — Z1231 Encounter for screening mammogram for malignant neoplasm of breast: Secondary | ICD-10-CM

## 2012-08-22 ENCOUNTER — Ambulatory Visit (HOSPITAL_COMMUNITY)
Admission: RE | Admit: 2012-08-22 | Discharge: 2012-08-22 | Disposition: A | Discharge status: Home / Self Care | Payer: PRIVATE HEALTH INSURANCE | Source: Ambulatory Visit | Attending: Internal Medicine | Admitting: Internal Medicine

## 2012-08-22 DIAGNOSIS — Z1231 Encounter for screening mammogram for malignant neoplasm of breast: Secondary | ICD-10-CM | POA: Insufficient documentation

## 2012-09-29 ENCOUNTER — Ambulatory Visit
Admission: RE | Admit: 2012-09-29 | Discharge: 2012-09-29 | Disposition: A | Discharge status: Home / Self Care | Payer: Medicare Other | Source: Ambulatory Visit | Attending: Endocrinology | Admitting: Endocrinology

## 2012-09-29 DIAGNOSIS — E049 Nontoxic goiter, unspecified: Secondary | ICD-10-CM

## 2012-09-30 ENCOUNTER — Telehealth (HOSPITAL_COMMUNITY): Payer: Self-pay | Admitting: *Deleted

## 2012-10-13 ENCOUNTER — Other Ambulatory Visit: Payer: Self-pay | Admitting: Endocrinology

## 2012-10-13 DIAGNOSIS — E049 Nontoxic goiter, unspecified: Secondary | ICD-10-CM

## 2012-10-16 ENCOUNTER — Other Ambulatory Visit (HOSPITAL_COMMUNITY): Payer: Self-pay | Admitting: Cardiovascular Disease

## 2012-10-16 DIAGNOSIS — I739 Peripheral vascular disease, unspecified: Secondary | ICD-10-CM

## 2012-10-21 ENCOUNTER — Ambulatory Visit (HOSPITAL_COMMUNITY)
Admission: RE | Admit: 2012-10-21 | Discharge: 2012-10-21 | Disposition: A | Discharge status: Home / Self Care | Payer: PRIVATE HEALTH INSURANCE | Source: Ambulatory Visit | Attending: Cardiovascular Disease | Admitting: Cardiovascular Disease

## 2012-10-21 DIAGNOSIS — I739 Peripheral vascular disease, unspecified: Secondary | ICD-10-CM

## 2012-10-21 NOTE — Progress Notes (Signed)
Carotid Duplex Completed. °[name],RVT °

## 2012-10-24 ENCOUNTER — Encounter: Payer: Self-pay | Admitting: *Deleted

## 2012-10-24 ENCOUNTER — Ambulatory Visit (HOSPITAL_COMMUNITY)
Admission: RE | Admit: 2012-10-24 | Discharge: 2012-10-24 | Disposition: A | Discharge status: Home / Self Care | Payer: PRIVATE HEALTH INSURANCE | Source: Ambulatory Visit | Attending: Internal Medicine | Admitting: Internal Medicine

## 2012-10-24 ENCOUNTER — Telehealth: Payer: Self-pay | Admitting: *Deleted

## 2012-10-24 DIAGNOSIS — Y832 Surgical operation with anastomosis, bypass or graft as the cause of abnormal reaction of the patient, or of later complication, without mention of misadventure at the time of the procedure: Secondary | ICD-10-CM | POA: Insufficient documentation

## 2012-10-24 DIAGNOSIS — T82898A Other specified complication of vascular prosthetic devices, implants and grafts, initial encounter: Secondary | ICD-10-CM | POA: Insufficient documentation

## 2012-10-24 DIAGNOSIS — I6529 Occlusion and stenosis of unspecified carotid artery: Secondary | ICD-10-CM

## 2012-10-24 DIAGNOSIS — I739 Peripheral vascular disease, unspecified: Secondary | ICD-10-CM

## 2012-10-24 NOTE — Progress Notes (Signed)
Lower Extremity Arterial Duplex Completed. °[name],RVT °

## 2012-10-24 NOTE — Telephone Encounter (Signed)
Order placed for repeat carotid doppler in 1 year 

## 2012-10-24 NOTE — Telephone Encounter (Signed)
Message copied by Marella Bile on Fri Oct 24, 2012  5:05 PM ------      Message from: Runell Gess      Created: Thu Oct 23, 2012  6:49 PM       Mild change from prior study. Repeat in 12 months. ------

## 2012-10-29 ENCOUNTER — Ambulatory Visit: Payer: PRIVATE HEALTH INSURANCE | Admitting: Cardiology

## 2012-10-30 ENCOUNTER — Encounter: Payer: Self-pay | Admitting: Cardiology

## 2012-10-30 ENCOUNTER — Ambulatory Visit (INDEPENDENT_AMBULATORY_CARE_PROVIDER_SITE_OTHER): Payer: PRIVATE HEALTH INSURANCE | Admitting: Cardiology

## 2012-10-30 VITALS — BP 142/72 | HR 92 | Ht 62.0 in | Wt 240.0 lb

## 2012-10-30 DIAGNOSIS — B372 Candidiasis of skin and nail: Secondary | ICD-10-CM

## 2012-10-30 DIAGNOSIS — I2510 Atherosclerotic heart disease of native coronary artery without angina pectoris: Secondary | ICD-10-CM

## 2012-10-30 DIAGNOSIS — R21 Rash and other nonspecific skin eruption: Secondary | ICD-10-CM | POA: Insufficient documentation

## 2012-10-30 DIAGNOSIS — I480 Paroxysmal atrial fibrillation: Secondary | ICD-10-CM

## 2012-10-30 DIAGNOSIS — I4891 Unspecified atrial fibrillation: Secondary | ICD-10-CM

## 2012-10-30 DIAGNOSIS — Z9889 Other specified postprocedural states: Secondary | ICD-10-CM

## 2012-10-30 DIAGNOSIS — I7389 Other specified peripheral vascular diseases: Secondary | ICD-10-CM

## 2012-10-30 MED ORDER — NYSTATIN-TRIAMCINOLONE 100000-0.1 UNIT/GM-% EX OINT: TOPICAL_OINTMENT | Freq: Two times a day (BID) | CUTANEOUS | Status: DC

## 2012-10-30 NOTE — Assessment & Plan Note (Signed)
RCIA stent, occluded LCIA, Aug 2010.R-L  FFBPG April 2012, ISR RCIA 11/2010 with PTA. Fem - fem crossover occluded.  Has moderate claudication and left leg with ambulation. Current ABIs Rt 0.88, Lt 0.49

## 2012-10-30 NOTE — Assessment & Plan Note (Signed)
No awareness of rapid heartbeats or irregular heartbeats EKG today with sinus rhythm.

## 2012-10-30 NOTE — Assessment & Plan Note (Signed)
Her valsartan had been changed to lisinopril and she has held her Amaryl for several weeks at the request of dermatology to see if her rash will improve.  There has been no change but this is being followed by other physicians

## 2012-10-30 NOTE — Patient Instructions (Signed)
Apply ointment under breast as needed for burning and itchin.  Follow up with Dr. Allyson Sabal in 6 months, unless increased leg pain.  Heart healthy diabetic diet.

## 2012-10-30 NOTE — Assessment & Plan Note (Signed)
Followed by Dr. Renae Gloss

## 2012-10-30 NOTE — Assessment & Plan Note (Signed)
No chest pain no change in her chronic shortness of breath due to COPD.

## 2012-10-30 NOTE — Progress Notes (Addendum)
10/30/2012   PCP: Alva Garnet., MD   Chief Complaint  Patient presents with  . Follow-up    to go over test done on monday    Primary Cardiologist:Dr. Allyson Sabal  HPI:  72 year old white female of Dr. Hazle Coca is followed here for peripheral vascular disease. She also has a history of paroxysmal A. fib EKG today reveals sinus rhythm. Chest has a history of normal coronary arteries by cardiac catheterization in 2002 with normal LV systolic and diastolic function.  She does have a history of severe aorta iliac disease with prior PTA and stenting of right common iliac artery with a 10 x 4 Smart nitinol self-expanding stent.  Her left common iliac artery could not be crossed at the stenosis site and she underwent a right-to-left femoral-femoral crossover graft during by Dr. Cari Caraway.  Her ABIs improved. Subsequently it has been determined that her fem-fem crossover graft is occluded. She also in 2012 had in-stent restenosis of her right common iliac artery and underwent PTA. Previous Dopplers in her office note in November ABI was 0.94 on the right the left was 0.58. Other history includes hypertension hyperlipidemia non-insulin requiring diabetes.  Most recent cholesterol panel total cholesterol 125 HDL 38 LDL 54 triglycerides 165 other labs were stable.    She does have claudication in the left leg with ambulation though difficult to tell how much she has left hip and lower back pain as well she was told she has a pinched nerve. Dr. [name] discussed with her appropriate exercises in the water and she has joined the [employer] and is planning to start that in the near future. Her claudication occurs after walking one block in her left leg. She has no pain in the right.  Recent ABIs 0.88 on Rt and 0.49 on the Lt.  Dr. [name] is concerned about infection if redo of rt-lt fem- fem was undertaken.   Currently no non healing wounds.  No chest pain and only usual SOB due to  COPD.    Allergies  Allergen Reactions  . Glucophage [Metformin Hydrochloride] Itching and Rash    REPORTED BY PATIENT; STATES SYMPTOMS APPEARED AFTER THIRD DOES WITHIN THE LAST MONTH.  Marland Kitchen Alendronate Sodium Other (See Comments)    Unknown   . Avelox [Moxifloxacin Hcl In Nacl]     Itching,rash  . Latex     rash  . Metformin And Related   . Omnipaque [Iohexol] Other (See Comments)    Unknown   . Adhesive [Tape] Rash  . Amoxicillin Itching and Rash  . Cephalexin Itching and Rash  . Contrast Media [Iodinated Diagnostic Agents] Itching and Rash  . Plavix [Clopidogrel Bisulfate] Itching and Rash    Current Outpatient Prescriptions  Medication Sig Dispense Refill  . aspirin 81 MG chewable tablet Chew 81 mg by mouth daily.        . Calcium-Magnesium-Vitamin D (CALCIUM MAGNESIUM PO) Take by mouth 2 (two) times daily.        . carvedilol (COREG) 6.25 MG tablet Take 12.5 mg by mouth 2 (two) times daily with a meal. 2 tablets .twice a day      . Cholecalciferol (VITAMIN D) 2000 UNITS tablet Take 2,000 Units by mouth daily.       Marland Kitchen glimepiride (AMARYL) 2 MG tablet Take 2 mg by mouth daily before breakfast.      . levothyroxine (SYNTHROID, LEVOTHROID) 88 MCG tablet Take 1 tablet by mouth as needed.      Marland Kitchen  lisinopril-hydrochlorothiazide (PRINZIDE,ZESTORETIC) 10-12.5 MG per tablet Take 1 tablet by mouth daily.      . mometasone-formoterol (DULERA) 100-5 MCG/ACT AERO Inhale 2 puffs into the lungs 2 (two) times daily.  1 Inhaler  5  . omeprazole (PRILOSEC) 40 MG capsule Take 40 mg by mouth daily.        . tizanidine (ZANAFLEX) 2 MG capsule Take by mouth as needed. Take one or two tablets per day prn      . Travoprost, BAK Free, (TRAVATAN) 0.004 % SOLN ophthalmic solution 1 drop at bedtime.      Marland Kitchen ACCU-CHEK AVIVA PLUS test strip       . albuterol (PROAIR HFA) 108 (90 BASE) MCG/ACT inhaler Inhale 2 puffs into the lungs every 6 (six) hours as needed for wheezing.  1 Inhaler  5  . FLUZONE  HIGH-DOSE injection Inject into the muscle once. Pt. Received inj. Last Friday 10-24-12      . hydrOXYzine (ATARAX/VISTARIL) 25 MG tablet       . Lancets (ACCU-CHEK MULTICLIX) lancets       . nystatin-triamcinolone ointment (MYCOLOG) Apply topically 2 (two) times daily.  15 g  0   No current facility-administered medications for this visit.    Past Medical History  Diagnosis Date  . Hypertension   . PAD (peripheral artery disease)   . Hypothyroidism   . Hypercholesteremia   . Obesity   . Shortness of breath   . Hyperthyroidism   . GERD (gastroesophageal reflux disease)   . Arthritis   . Diabetes mellitus   . Chronic obstructive asthma 05/28/2011  . COPD (chronic obstructive pulmonary disease)   . Claudication     LEA DOPPLER, 12/10/2011 - FEMORAL-FEMORAL BYPASS GRAFT-occluded; LEFT TIBIAL-appeared occluded;   . DOE (dyspnea on exertion)     NUCLEAR STRESS TEST, 08/03/2010 - EKG negative for ischemia  . SOB (shortness of breath)     2D ECHO, 03/01/2011 - EF 65-70%, moderate LVH,     Past Surgical History  Procedure Laterality Date  . Femoral-femoral bypass graft  05/09/2010    Right to Left Fem-Fem BPG by Dr. [name]  . Tubal ligation    . Descending aortic aneurysm repair w/ stent      Stent repair  . Percutaneous stent intervention  12/12/2010    Rt common iliac stenosis stented with a 8x49mm ICAST covered stent resulting in a reduction of a 50% in-stent restenosis to 0%  . Percutaneous stent intervention  04/20/2010    Rt common iliac stenosis stented with a 8x18 Genesis OPTA stent balloon resulting in reduciton of a 70% stenosis to 0% residual  . Percutaneous stent intervention  10/18/2008    No intervention  . Percutaneous stent intervention  09/14/2008    Rt common iliac stenosis stented with a 4x10 Cordis stent resulting in a reduction of 99% stenosis to less than 20% residual  . Cardiac catheterization  10/18/2008    No intervention  . Cardiac catheterization  09/14/2008     Continue medical therapy    WUJ:WJXBJYN:WG colds or fevers, no weight changes Skin:+ rashes followed by Dermatology  No ulcers HEENT:no blurred vision, no congestion CV:see HPI PUL:see HPI GI:no diarrhea constipation or melena, no indigestion GU:no hematuria, no dysuria MS:no joint pain, no claudication Neuro:no syncope, no lightheadedness Endo:+ diabetes stable, + thyroid disease-stable  PHYSICAL EXAM BP 142/72  Pulse 92  Ht 5\' 2"  (1.575 m)  Wt 240 lb (108.863 kg)  BMI 43.89 kg/m2 General:Pleasant affect, NAD Skin:Warm  and dry, brisk capillary refill, mild diffuse rash on ext.  HEENT:normocephalic, sclera clear, mucus membranes moist Neck:supple, no JVD, no bruits, no adenopathy  Heart:S1S2 RRR without murmur, gallup, rub or click Lungs:clear without rales, rhonchi, or wheezes ION:GEXB, non tender, + BS, do not palpate liver spleen or masses Ext:no lower ext edema  Neuro:alert and oriented, MAE, follows commands, + facial symmetry  EKG:SR no acute changes.  ASSESSMENT AND PLAN PVD WITH CLAUDICATION RCIA stent, occluded LCIA, Aug 2010.R-L  FFBPG April 2012, ISR RCIA 11/2010 with PTA. Fem - fem crossover occluded.  Has moderate claudication and left leg with ambulation. Current ABIs Rt 0.88, Lt 0.49    PAF (paroxysmal atrial fibrillation) No awareness of rapid heartbeats or irregular heartbeats EKG today with sinus rhythm.  H/O cardiac catheterization, 2010, no significant CAD after Positive stress test No chest pain no change in her chronic shortness of breath due to COPD.  Diabetes mellitus Followed by Dr. Renae Gloss  Rash, secondary to medication.   Her valsartan had been changed to lisinopril and she has held her Amaryl for several weeks at the request of dermatology to see if her rash will improve.  There has been no change but this is being followed by other physicians   She will followup with Dr. Allyson Sabal in 6 months unless she has further problems with her left  leg and claudication.She complained of yeast infections under her breasts and I ordered Nystatin triam.  Ointment to help with itching and burning.

## 2012-10-31 ENCOUNTER — Encounter: Payer: Self-pay | Admitting: Cardiology

## 2012-10-31 ENCOUNTER — Encounter: Payer: Self-pay | Admitting: Cardiovascular Disease

## 2012-11-18 ENCOUNTER — Telehealth: Payer: Self-pay | Admitting: *Deleted

## 2012-11-18 DIAGNOSIS — I739 Peripheral vascular disease, unspecified: Secondary | ICD-10-CM

## 2012-11-18 NOTE — Telephone Encounter (Signed)
Order placed for repeat lower ext doppler in 6 months 

## 2012-11-18 NOTE — Telephone Encounter (Signed)
Message copied by Marella Bile on Tue Nov 18, 2012  3:47 PM ------      Message from: Runell Gess      Created: Sun Nov 02, 2012 12:45 PM       Return office visit with me to discuss results at next available ------

## 2013-01-28 ENCOUNTER — Telehealth: Payer: Self-pay | Admitting: Pulmonary Disease

## 2013-01-28 ENCOUNTER — Encounter: Payer: Self-pay | Admitting: Pulmonary Disease

## 2013-01-28 ENCOUNTER — Ambulatory Visit (INDEPENDENT_AMBULATORY_CARE_PROVIDER_SITE_OTHER): Payer: Medicare Other | Admitting: Pulmonary Disease

## 2013-01-28 VITALS — BP 126/82 | HR 78 | Ht 62.0 in | Wt 239.0 lb

## 2013-01-28 DIAGNOSIS — J449 Chronic obstructive pulmonary disease, unspecified: Secondary | ICD-10-CM

## 2013-01-28 DIAGNOSIS — R05 Cough: Secondary | ICD-10-CM

## 2013-01-28 DIAGNOSIS — R0600 Dyspnea, unspecified: Secondary | ICD-10-CM

## 2013-01-28 DIAGNOSIS — R059 Cough, unspecified: Secondary | ICD-10-CM

## 2013-01-28 DIAGNOSIS — J45909 Unspecified asthma, uncomplicated: Secondary | ICD-10-CM

## 2013-01-28 DIAGNOSIS — R0989 Other specified symptoms and signs involving the circulatory and respiratory systems: Secondary | ICD-10-CM

## 2013-01-28 DIAGNOSIS — R0609 Other forms of dyspnea: Secondary | ICD-10-CM

## 2013-01-28 DIAGNOSIS — J45998 Other asthma: Secondary | ICD-10-CM

## 2013-01-28 DIAGNOSIS — R058 Other specified cough: Secondary | ICD-10-CM | POA: Insufficient documentation

## 2013-01-28 MED ORDER — TRIAMCINOLONE ACETONIDE 55 MCG/ACT NA AERO: 2.0000 | INHALATION_SPRAY | Freq: Every day | NASAL | Status: DC

## 2013-01-28 MED ORDER — ALBUTEROL SULFATE HFA 108 (90 BASE) MCG/ACT IN AERS: 2.0000 | INHALATION_SPRAY | Freq: Four times a day (QID) | RESPIRATORY_TRACT | Status: DC | PRN

## 2013-01-28 MED ORDER — MOMETASONE FURO-FORMOTEROL FUM 100-5 MCG/ACT IN AERO: 2.0000 | INHALATION_SPRAY | Freq: Two times a day (BID) | RESPIRATORY_TRACT | Status: DC

## 2013-01-28 NOTE — Assessment & Plan Note (Signed)
Will have her try nasal irrigation and nasacort.

## 2013-01-28 NOTE — Patient Instructions (Signed)
Nasal irrigation (saline nasal spray) daily for two weeks, then as needed for sinus congestion Nasacort two sprays each nostril daily for two weeks, then as needed for sinus congestion  Dulera two puffs twice per day, and rinse mouth after each use Proair two puffs up to four times per day as needed for cough, wheeze, or chest congestion Follow up in 6 weeks

## 2013-01-28 NOTE — Progress Notes (Signed)
Chief Complaint  Patient presents with  . COPD with Asthma    Breathing is unchanged. Reports DOE, coughing with production from time to time and slight wheezing. Denies chest tightness.    History of Present Illness: [name] is a 73 y.o. female former smoker with dyspnea, and COPD with asthma.  I last saw [name] in July 2013.  She has been off dulera for several weeks.  She was having more trouble with her cough and chest congestion.  She was getting occasional wheeze also.  She went to refill dulera but was told she did not have any refills, and needed appointment with pulmonary first.  She is also having sinus congestion with post-nasal drip.  This triggers her cough.  She has trouble with claudication >> she has PAD and low back pain with sciatica.   TESTS: V/Q scan 05/15/10 >> very low probabilty for pulmonary embolism Echo 03/01/11 >> mod LVH, EF 65 to 76%, grade 1 diastolic dysfx, mild LA dilation, mild RA/RV dilation PFT 05/28/11 >> FEV1 1.01 (54%), FEV1% 73, TLC 4.44 (100%), RV 2.74 (156%), DLCO 49%, +BD response   She  has a past medical history of Hypertension; PAD (peripheral artery disease); Hypothyroidism; Hypercholesteremia; Obesity; Shortness of breath; Hyperthyroidism; GERD (gastroesophageal reflux disease); Arthritis; Diabetes mellitus; Chronic obstructive asthma (05/28/2011); COPD (chronic obstructive pulmonary disease); Claudication; DOE (dyspnea on exertion); and SOB (shortness of breath).   She  has past surgical history that includes Femoral-femoral Bypass Graft (05/09/2010); Tubal ligation; Descending aortic aneurysm repair w/ stent; Percutaneous stent intervention (12/12/2010); Percutaneous stent intervention (04/20/2010); Percutaneous stent intervention (10/18/2008); Percutaneous stent intervention (09/14/2008); Cardiac catheterization (10/18/2008); and Cardiac catheterization (09/14/2008).   Current Outpatient Prescriptions on File Prior to Visit   Medication Sig Dispense Refill  . ACCU-CHEK AVIVA PLUS test strip       . aspirin 81 MG chewable tablet Chew 81 mg by mouth daily.        . Calcium-Magnesium-Vitamin D (CALCIUM MAGNESIUM PO) Take by mouth 2 (two) times daily.        . carvedilol (COREG) 6.25 MG tablet Take 12.5 mg by mouth 2 (two) times daily with a meal. 2 tablets .twice a day      . Cholecalciferol (VITAMIN D) 2000 UNITS tablet Take 2,000 Units by mouth daily.       Marland Kitchen glimepiride (AMARYL) 2 MG tablet Take 2 mg by mouth daily before breakfast.      . hydrOXYzine (ATARAX/VISTARIL) 25 MG tablet Take 50 mg by mouth at bedtime.       . Lancets (ACCU-CHEK MULTICLIX) lancets       . levothyroxine (SYNTHROID, LEVOTHROID) 88 MCG tablet Take 1 tablet by mouth as needed.      Marland Kitchen lisinopril-hydrochlorothiazide (PRINZIDE,ZESTORETIC) 10-12.5 MG per tablet Take 1 tablet by mouth daily.      Marland Kitchen nystatin-triamcinolone ointment (MYCOLOG) Apply topically 2 (two) times daily.  15 g  0  . omeprazole (PRILOSEC) 40 MG capsule Take 40 mg by mouth daily.        . tizanidine (ZANAFLEX) 2 MG capsule Take by mouth as needed. Take one or two tablets per day prn      . Travoprost, BAK Free, (TRAVATAN) 0.004 % SOLN ophthalmic solution 1 drop at bedtime.       No current facility-administered medications on file prior to visit.    Allergies  Allergen Reactions  . Glucophage [Metformin Hydrochloride] Itching and Rash    REPORTED BY PATIENT; STATES SYMPTOMS APPEARED  AFTER THIRD DOES WITHIN THE LAST MONTH.  Marland Kitchen Alendronate Sodium Other (See Comments)    Unknown   . Avelox [Moxifloxacin Hcl In Nacl]     Itching,rash  . Latex     rash  . Metformin And Related   . Omnipaque [Iohexol] Other (See Comments)    Unknown   . Adhesive [Tape] Rash  . Amoxicillin Itching and Rash  . Cephalexin Itching and Rash  . Contrast Media [Iodinated Diagnostic Agents] Itching and Rash  . Plavix [Clopidogrel Bisulfate] Itching and Rash    Physical Exam:  HEENT - No  sinus tenderness, no oral exudate, no LAN  Cardiac - s1s2 regular, no murmur  Chest - prolonged exhalation, decreased breath sounds, no wheeze/rales/dullness  Abdomen - obese, soft, nontender  Extremities - no e/c/c  Neurologic - normals strength Skin - no rashes  Psychiatric - normal mood, behavior   Assessment/Plan:  Chesley Mires, MD [place] 01/28/2013, 11:34 AM Pager:  [phone] After 3pm call: [phone]

## 2013-01-28 NOTE — Assessment & Plan Note (Addendum)
Will renew dulera and proair.  Will f/u in 6 weeks, and then determine if additional testing is needed.

## 2013-01-28 NOTE — Telephone Encounter (Signed)
ATC PT NA no VM. WCB 

## 2013-01-28 NOTE — Assessment & Plan Note (Signed)
Related to asthma, heart disease, peripheral artery disease, and deconditioning. 

## 2013-01-29 NOTE — Telephone Encounter (Signed)
Please check whether symbicort 160/4.5 two puffs bid or advair 250/50 one puff bid is covered by her insurance.  Preference would be for symbicort.

## 2013-01-29 NOTE — Telephone Encounter (Signed)
I spoke with the pt and she states that [facility] is not covered. I called [facility] to see if a PA is needed but the message they are receiving is that the dulera is just not covered. Dr. Halford Chessman please advise if there is alternative that you would like to try with this patient?  Please advise. Langley Bing, CMA Allergies  Allergen Reactions  . Glucophage [Metformin Hydrochloride] Itching and Rash    REPORTED BY PATIENT; STATES SYMPTOMS APPEARED AFTER THIRD DOES WITHIN THE LAST MONTH.  Marland Kitchen Alendronate Sodium Other (See Comments)    Unknown   . Avelox [Moxifloxacin Hcl In Nacl]     Itching,rash  . Latex     rash  . Metformin And Related   . Omnipaque [Iohexol] Other (See Comments)    Unknown   . Adhesive [Tape] Rash  . Amoxicillin Itching and Rash  . Cephalexin Itching and Rash  . Contrast Media [Iodinated Diagnostic Agents] Itching and Rash  . Plavix [Clopidogrel Bisulfate] Itching and Rash

## 2013-01-29 NOTE — Telephone Encounter (Signed)
LMTCB for the pt 

## 2013-01-30 MED ORDER — BUDESONIDE-FORMOTEROL FUMARATE 160-4.5 MCG/ACT IN AERO: 2.0000 | INHALATION_SPRAY | Freq: Two times a day (BID) | RESPIRATORY_TRACT | Status: DC

## 2013-01-30 NOTE — Telephone Encounter (Signed)
LMTCBx2. Jennifer Castillo, CMA  

## 2013-01-30 NOTE — Telephone Encounter (Signed)
Called and spoke with pt and she is aware of VS recs to change the dulera to symbicort 160 mg   2 puffs bid.  Pt is aware that we will send this to her pharmacy.  Pt is aware. Nothing further is needed.

## 2013-01-30 NOTE — Telephone Encounter (Signed)
Patient phoned primary care triage and lm, but according to phone encounter documentation, she finally phoned correct dept-pulmonary.

## 2013-02-08 ENCOUNTER — Emergency Department (HOSPITAL_COMMUNITY): Payer: Medicare Other

## 2013-02-08 ENCOUNTER — Emergency Department (HOSPITAL_COMMUNITY)
Admission: EM | Admit: 2013-02-08 | Discharge: 2013-02-08 | Disposition: A | Discharge status: Home / Self Care | Payer: Medicare Other | Attending: Emergency Medicine | Admitting: Emergency Medicine

## 2013-02-08 ENCOUNTER — Encounter (HOSPITAL_COMMUNITY): Payer: Self-pay | Admitting: Emergency Medicine

## 2013-02-08 DIAGNOSIS — Z792 Long term (current) use of antibiotics: Secondary | ICD-10-CM | POA: Insufficient documentation

## 2013-02-08 DIAGNOSIS — N390 Urinary tract infection, site not specified: Secondary | ICD-10-CM

## 2013-02-08 DIAGNOSIS — Z79899 Other long term (current) drug therapy: Secondary | ICD-10-CM | POA: Insufficient documentation

## 2013-02-08 DIAGNOSIS — J449 Chronic obstructive pulmonary disease, unspecified: Secondary | ICD-10-CM | POA: Insufficient documentation

## 2013-02-08 DIAGNOSIS — K219 Gastro-esophageal reflux disease without esophagitis: Secondary | ICD-10-CM | POA: Insufficient documentation

## 2013-02-08 DIAGNOSIS — E669 Obesity, unspecified: Secondary | ICD-10-CM | POA: Insufficient documentation

## 2013-02-08 DIAGNOSIS — J4489 Other specified chronic obstructive pulmonary disease: Secondary | ICD-10-CM | POA: Insufficient documentation

## 2013-02-08 DIAGNOSIS — Z87891 Personal history of nicotine dependence: Secondary | ICD-10-CM | POA: Insufficient documentation

## 2013-02-08 DIAGNOSIS — E039 Hypothyroidism, unspecified: Secondary | ICD-10-CM | POA: Insufficient documentation

## 2013-02-08 DIAGNOSIS — M129 Arthropathy, unspecified: Secondary | ICD-10-CM | POA: Insufficient documentation

## 2013-02-08 DIAGNOSIS — I10 Essential (primary) hypertension: Secondary | ICD-10-CM | POA: Insufficient documentation

## 2013-02-08 DIAGNOSIS — E7800 Pure hypercholesterolemia, unspecified: Secondary | ICD-10-CM | POA: Insufficient documentation

## 2013-02-08 DIAGNOSIS — E119 Type 2 diabetes mellitus without complications: Secondary | ICD-10-CM | POA: Insufficient documentation

## 2013-02-08 DIAGNOSIS — Z95818 Presence of other cardiac implants and grafts: Secondary | ICD-10-CM | POA: Insufficient documentation

## 2013-02-08 DIAGNOSIS — Z91040 Latex allergy status: Secondary | ICD-10-CM | POA: Insufficient documentation

## 2013-02-08 DIAGNOSIS — Z880 Allergy status to penicillin: Secondary | ICD-10-CM | POA: Insufficient documentation

## 2013-02-08 DIAGNOSIS — IMO0002 Reserved for concepts with insufficient information to code with codable children: Secondary | ICD-10-CM | POA: Insufficient documentation

## 2013-02-08 DIAGNOSIS — E0590 Thyrotoxicosis, unspecified without thyrotoxic crisis or storm: Secondary | ICD-10-CM | POA: Insufficient documentation

## 2013-02-08 DIAGNOSIS — Z7982 Long term (current) use of aspirin: Secondary | ICD-10-CM | POA: Insufficient documentation

## 2013-02-08 LAB — URINALYSIS, ROUTINE W REFLEX MICROSCOPIC
Bilirubin Urine: NEGATIVE
GLUCOSE, UA: NEGATIVE mg/dL
Hgb urine dipstick: NEGATIVE
KETONES UR: NEGATIVE mg/dL
Nitrite: NEGATIVE
PROTEIN: NEGATIVE mg/dL
Specific Gravity, Urine: 1.015 (ref 1.005–1.030)
UROBILINOGEN UA: 0.2 mg/dL (ref 0.0–1.0)
pH: 6 (ref 5.0–8.0)

## 2013-02-08 LAB — URINE MICROSCOPIC-ADD ON

## 2013-02-08 LAB — GLUCOSE, CAPILLARY: Glucose-Capillary: 143 mg/dL — ABNORMAL HIGH (ref 70–99)

## 2013-02-08 MED ORDER — SULFAMETHOXAZOLE-TRIMETHOPRIM 400-80 MG PO TABS: 1.0000 | ORAL_TABLET | Freq: Two times a day (BID) | ORAL | Status: DC

## 2013-02-08 MED ORDER — SULFAMETHOXAZOLE-TMP DS 800-160 MG PO TABS
1.0000 | ORAL_TABLET | Freq: Two times a day (BID) | ORAL | Status: DC
  Administered 2013-02-08: 1 via ORAL
  Filled 2013-02-08: qty 1

## 2013-02-08 NOTE — ED Provider Notes (Signed)
CSN: [id number]     Arrival date & time 02/08/13  0201 History   First MD Initiated Contact with Patient 02/08/13 [phone]     Chief Complaint  Patient presents with  . Back Pain   (Consider location/radiation/quality/duration/timing/severity/associated sxs/prior Treatment) HPI Comments: Patient states she has brief, flank pain.  That does not radiate.  That started approximately 6 days ago. Denies any known injury.  She does, state that she was recently started on Symbicort, which has increased her coughing.  This was started approximately 4 days before she developed this pain. The pain is worse with movement or coughing.  She has tried taking Advil without relief.  Denies any dysuria, fever, shortness of breath, trauma.  Patient is a 73 y.o. female presenting with back pain. The history is provided by the patient.  Back Pain Location:  Lumbar spine Quality:  Aching Radiates to:  Does not radiate Pain severity:  Mild Onset quality:  Unable to specify Duration:  6 days Timing:  Intermittent Progression:  Unchanged Chronicity:  New Context comment:  Coughing Relieved by:  Nothing Worsened by:  Coughing and movement Ineffective treatments:  NSAIDs Associated symptoms: no abdominal pain, no abdominal swelling, no bladder incontinence, no bowel incontinence, no chest pain, no dysuria, no fever, no leg pain, no numbness, no paresthesias, no pelvic pain and no weakness     Past Medical History  Diagnosis Date  . Hypertension   . PAD (peripheral artery disease)   . Hypothyroidism   . Hypercholesteremia   . Obesity   . Shortness of breath   . Hyperthyroidism   . GERD (gastroesophageal reflux disease)   . Arthritis   . Diabetes mellitus   . Chronic obstructive asthma 05/28/2011  . COPD (chronic obstructive pulmonary disease)   . Claudication     LEA DOPPLER, 12/10/2011 - FEMORAL-FEMORAL BYPASS GRAFT-occluded; LEFT TIBIAL-appeared occluded;   . DOE (dyspnea on exertion)     NUCLEAR  STRESS TEST, 08/03/2010 - EKG negative for ischemia  . SOB (shortness of breath)     2D ECHO, 03/01/2011 - EF 65-70%, moderate LVH,    Past Surgical History  Procedure Laterality Date  . Femoral-femoral bypass graft  05/09/2010    Right to Left Fem-Fem BPG by Dr. Scot Dock  . Tubal ligation    . Descending aortic aneurysm repair w/ stent      Stent repair  . Percutaneous stent intervention  12/12/2010    Rt common iliac stenosis stented with a 8x71mm ICAST covered stent resulting in a reduction of a 50% in-stent restenosis to 0%  . Percutaneous stent intervention  04/20/2010    Rt common iliac stenosis stented with a 8x18 Genesis OPTA stent balloon resulting in reduciton of a 70% stenosis to 0% residual  . Percutaneous stent intervention  10/18/2008    No intervention  . Percutaneous stent intervention  09/14/2008    Rt common iliac stenosis stented with a 4x10 Cordis stent resulting in a reduction of 99% stenosis to less than 20% residual  . Cardiac catheterization  10/18/2008    No intervention  . Cardiac catheterization  09/14/2008    Continue medical therapy   Family History  Problem Relation Age of Onset  . Diabetes Mother   . Heart disease Mother   . Hyperlipidemia Mother   . Cancer Sister   . Allergies Daughter     x3   History  Substance Use Topics  . Smoking status: Former Smoker -- 2.00 packs/day for 47 years  Types: Cigarettes    Quit date: 01/17/2010  . Smokeless tobacco: Never Used  . Alcohol Use: No   OB History   Grav Para Term Preterm Abortions TAB SAB Ect Mult Living                 Review of Systems  Constitutional: Negative for fever.  Respiratory: Positive for cough. Negative for shortness of breath.   Cardiovascular: Negative for chest pain.  Gastrointestinal: Negative for nausea, vomiting, abdominal pain, diarrhea, constipation and bowel incontinence.  Genitourinary: Positive for flank pain. Negative for bladder incontinence, dysuria, frequency and  pelvic pain.  Musculoskeletal: Positive for back pain.  Skin: Negative for rash.  Neurological: Negative for weakness, numbness and paresthesias.  All other systems reviewed and are negative.    Allergies  Glucophage; Alendronate sodium; Avelox; Latex; Metformin and related; Omnipaque; Adhesive; Amoxicillin; Cephalexin; Contrast media; and Plavix  Home Medications   Current Outpatient Rx  Name  Route  Sig  Dispense  Refill  . albuterol (PROAIR HFA) 108 (90 BASE) MCG/ACT inhaler   Inhalation   Inhale 2 puffs into the lungs every 6 (six) hours as needed for wheezing.   1 Inhaler   5   . aspirin 81 MG chewable tablet   Oral   Chew 81 mg by mouth daily.          . budesonide-formoterol (SYMBICORT) 160-4.5 MCG/ACT inhaler   Inhalation   Inhale 2 puffs into the lungs 2 (two) times daily.   1 Inhaler   6   . Calcium-Magnesium-Vitamin D (CALCIUM MAGNESIUM PO)   Oral   Take by mouth 2 (two) times daily.           . Cholecalciferol (VITAMIN D) 2000 UNITS tablet   Oral   Take 2,000 Units by mouth daily.          Marland Kitchen glimepiride (AMARYL) 2 MG tablet   Oral   Take 2 mg by mouth daily before breakfast.         . hydrOXYzine (ATARAX/VISTARIL) 25 MG tablet   Oral   Take 25 mg by mouth 4 (four) times daily.          Marland Kitchen levothyroxine (SYNTHROID, LEVOTHROID) 88 MCG tablet   Oral   Take 1 tablet by mouth daily before breakfast.          . lisinopril-hydrochlorothiazide (PRINZIDE,ZESTORETIC) 10-12.5 MG per tablet   Oral   Take 1 tablet by mouth daily.         Marland Kitchen omeprazole (PRILOSEC) 40 MG capsule   Oral   Take 40 mg by mouth daily.           . Travoprost, BAK Free, (TRAVATAN) 0.004 % SOLN ophthalmic solution   Both Eyes   Place 1 drop into both eyes at bedtime.          . carvedilol (COREG) 6.25 MG tablet   Oral   Take 12.5 mg by mouth 2 (two) times daily with a meal. 2 tablets .twice a day         . sulfamethoxazole-trimethoprim (BACTRIM,SEPTRA) 400-80  MG per tablet   Oral   Take 1 tablet by mouth 2 (two) times daily.   5 tablet   0   . triamcinolone (NASACORT AQ) 55 MCG/ACT AERO nasal inhaler   Nasal   Place 2 sprays into the nose daily.   1 Inhaler   12    BP 155/71  Pulse 104  Temp(Src) 97.4 F (36.3 C) (  Oral)  Resp 16  Ht 5\' 2"  (1.575 m)  Wt 237 lb (107.502 kg)  BMI 43.34 kg/m2  SpO2 95% Physical Exam  Vitals reviewed. Constitutional: She is oriented to person, place, and time. She appears well-developed and well-nourished.  HENT:  Head: Normocephalic.  Eyes: Pupils are equal, round, and reactive to light.  Neck: Normal range of motion.  Cardiovascular: Normal rate and regular rhythm.   Pulmonary/Chest: Breath sounds normal. She is in respiratory distress. She has no wheezes.  Abdominal: Soft.  obese  Musculoskeletal: Normal range of motion. She exhibits tenderness. She exhibits no edema.       Arms: Neurological: She is alert and oriented to person, place, and time.  Skin: Skin is warm. No rash noted. No erythema.    ED Course  Procedures (including critical care time) Labs Review Labs Reviewed  GLUCOSE, CAPILLARY - Abnormal; Notable for the following:    Glucose-Capillary 143 (*)    All other components within normal limits  URINALYSIS, ROUTINE W REFLEX MICROSCOPIC - Abnormal; Notable for the following:    APPearance CLOUDY (*)    Leukocytes, UA LARGE (*)    All other components within normal limits  URINE MICROSCOPIC-ADD ON - Abnormal; Notable for the following:    Bacteria, UA MANY (*)    All other components within normal limits  URINE CULTURE   Imaging Review Dg Chest 2 View  02/08/2013   CLINICAL DATA:  Right-sided flank and chest pain. Cough. History of smoking and asthma.  EXAM: CHEST  2 VIEW  COMPARISON:  Chest radiograph performed 05/01/2011  FINDINGS: The lungs are well-aerated. Mild chronic peribronchial thickening is noted, with mildly increased interstitial markings. There is no evidence  of focal opacification, pleural effusion or pneumothorax.  The heart is borderline normal in size; the mediastinal contour is within normal limits. No acute osseous abnormalities are seen.  IMPRESSION: Mild chronic peribronchial thickening and chronic interstitial changes noted; lungs otherwise clear.   Electronically Signed   By: Garald Balding M.D.   On: 02/08/2013 04:45    EKG Interpretation   None       MDM   1. UTI (lower urinary tract infection)     Consulted with pharmacy.  Due to patient's multiple medical allergies, for appropriate antibiotic to treat UTI    Garald Balding, NP 02/08/13 0600

## 2013-02-08 NOTE — ED Notes (Signed)
Pt reports R lower back pain that began on Monday and has progressed since then, reports taking Aleve for the pain without relief, no known injury, reports it does not hurt all the time, but when moving certain ways the pain increases. Pt ambulatory to triage, and will be driving herself home.

## 2013-02-08 NOTE — ED Provider Notes (Signed)
Medical screening examination/treatment/procedure(s) were performed by non-physician practitioner and as supervising physician I was immediately available for consultation/collaboration.    Teressa Lower, MD 02/08/13 [phone]

## 2013-02-08 NOTE — Discharge Instructions (Signed)
Take the antibiotic as directed until, completed.  Make an appointment with Dr. Karlton Lemon, for followup

## 2013-02-09 LAB — URINE CULTURE

## 2013-02-16 IMAGING — US US SOFT TISSUE HEAD/NECK
1 series · 14 of 25 positions shown · non-contrast
Comparison: Ultrasound of the thyroid of 10/02/2010

CLINICAL DATA: Follow up of thyroid goiter

THYROID ULTRASOUND
TECHNIQUE: Ultrasound examination of the thyroid gland and adjacent
soft tissues was performed.

[Series 1: us soft tissue head/neck · 0.07mm/px · 14 of 53 slices shown]
[im 1/53]
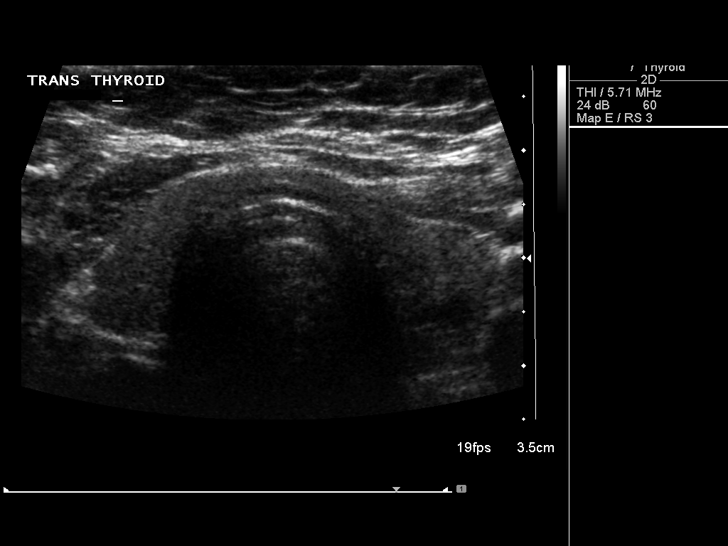
[im 5/53]
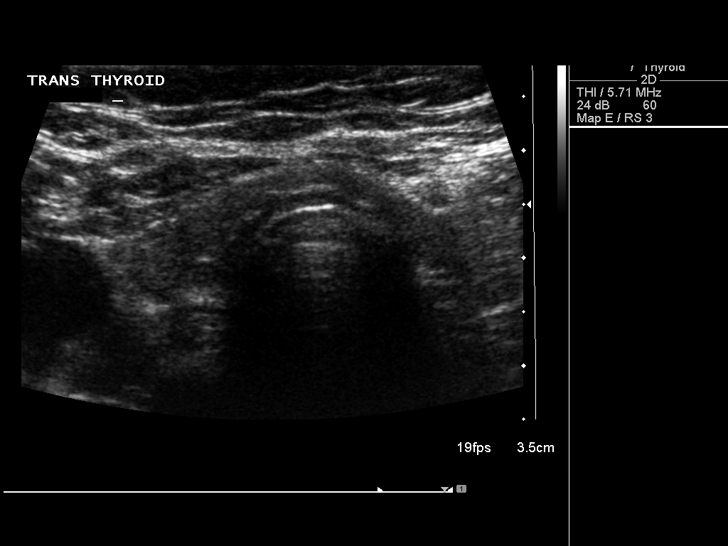
[im 9/53]
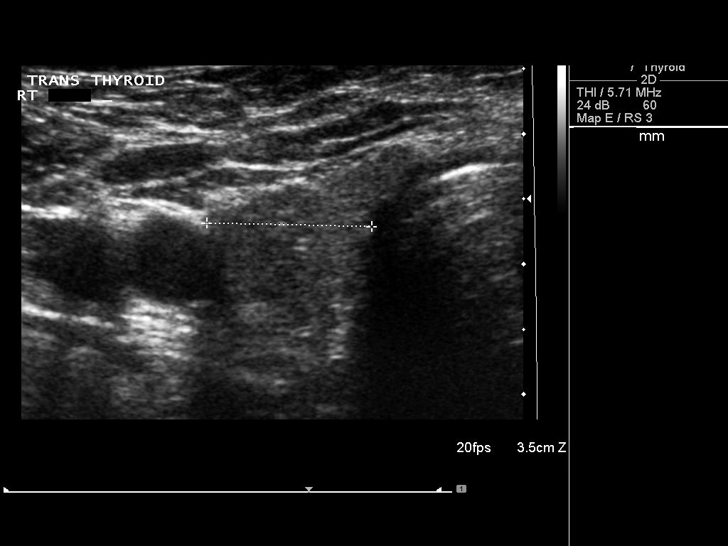
[im 14/53]
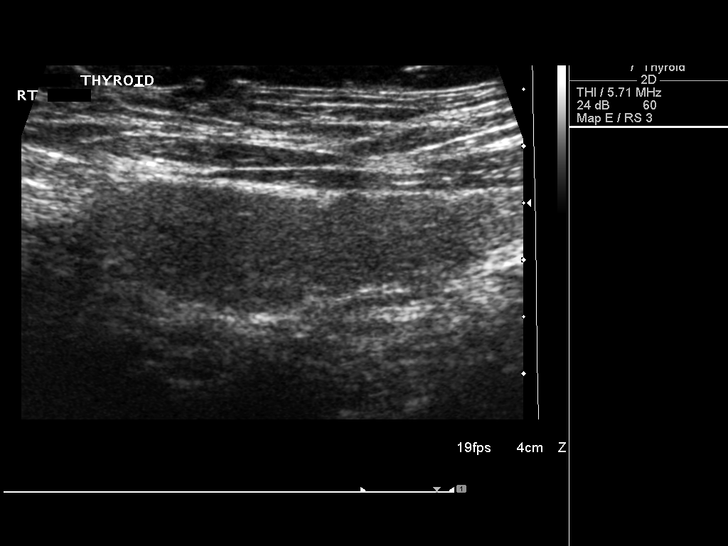
[im 18/53]
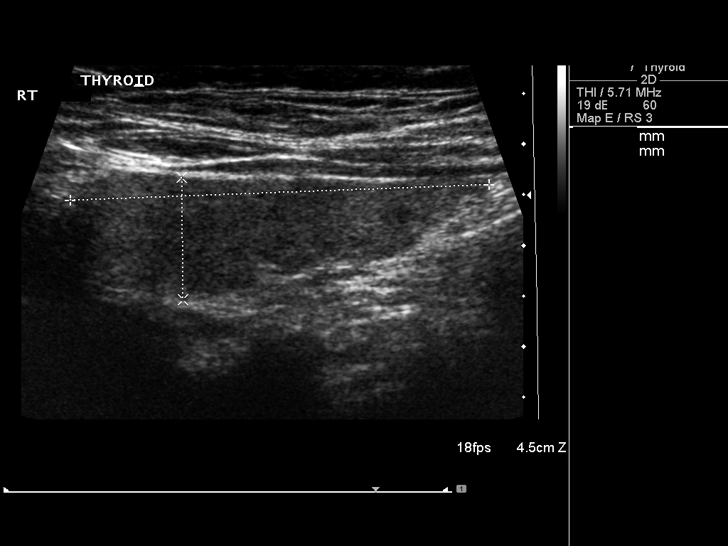
[im 20/53]
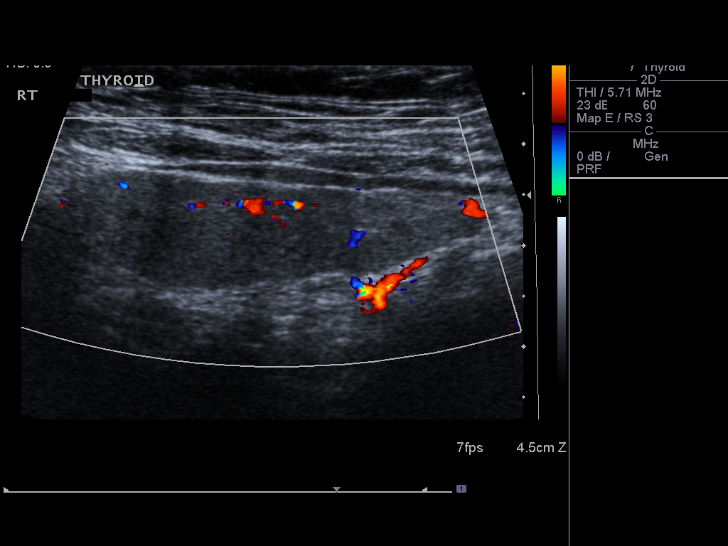
[im 24/53]
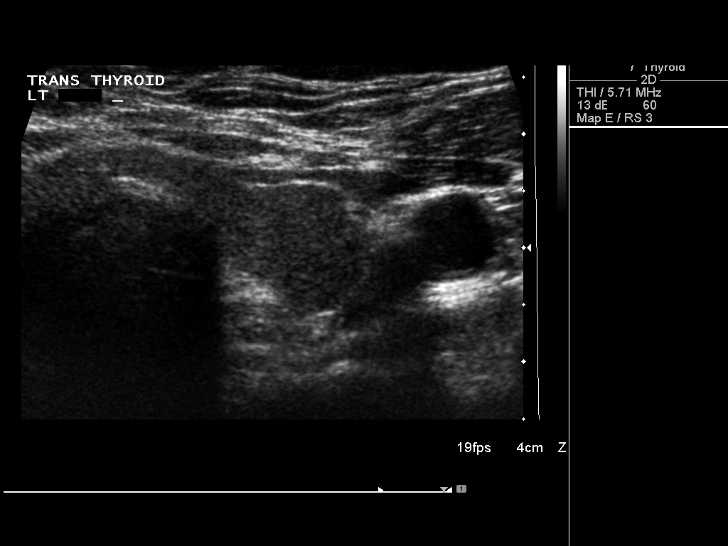
[im 29/53]
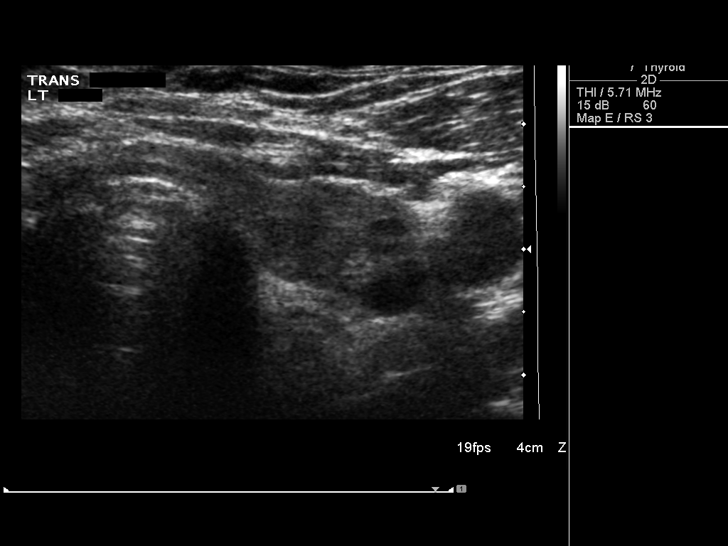
[im 33/53]
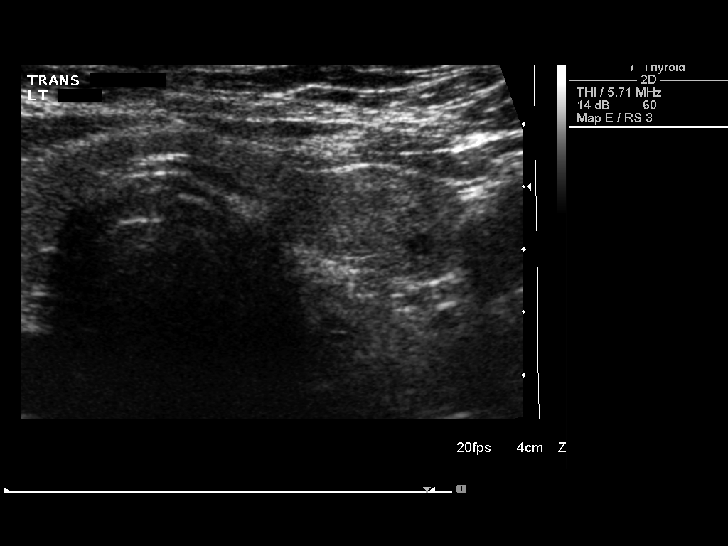
[im 35/53]
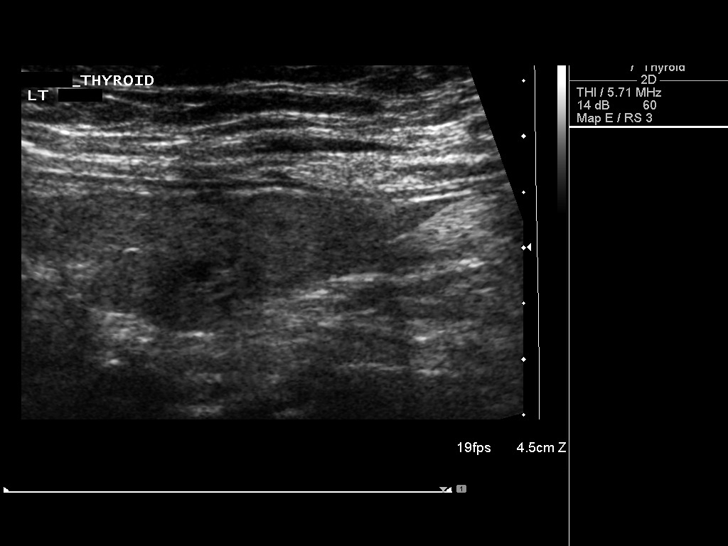
[im 40/53]
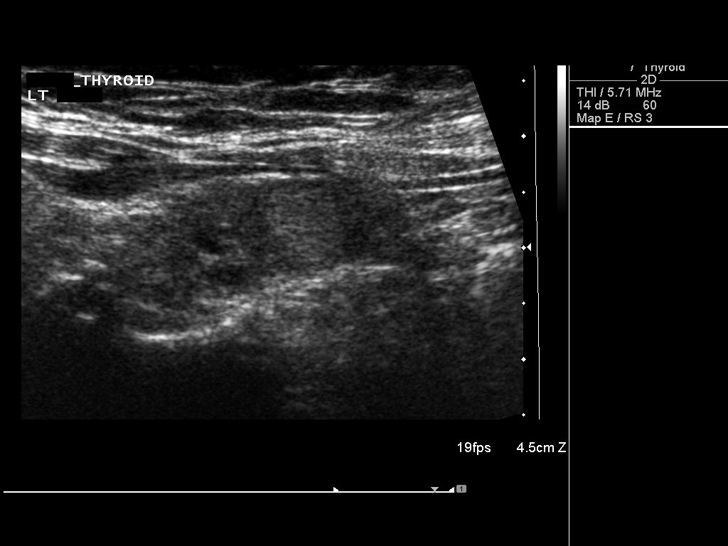
[im 44/53]
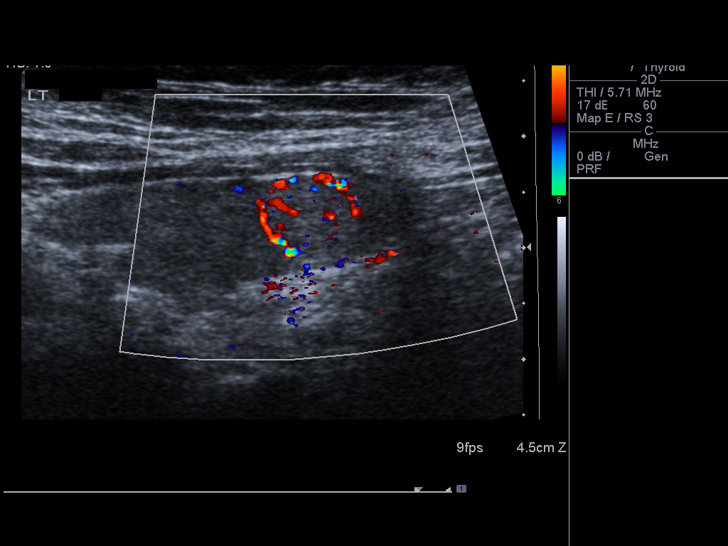
[im 48/53]
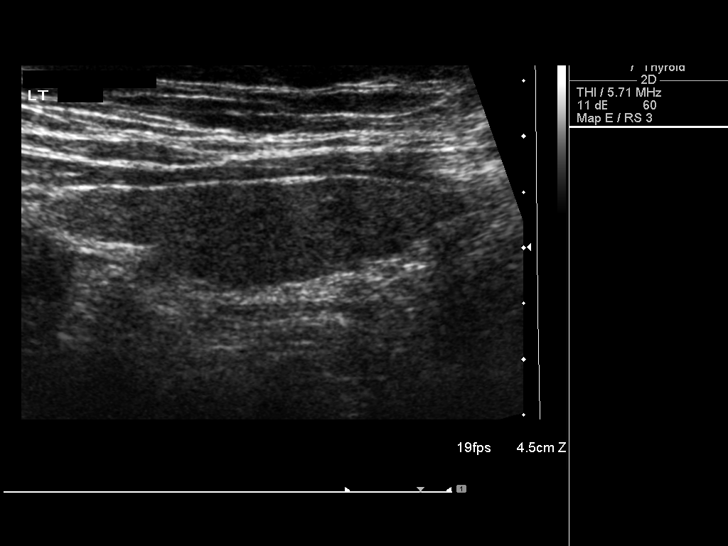
[im 53/53]
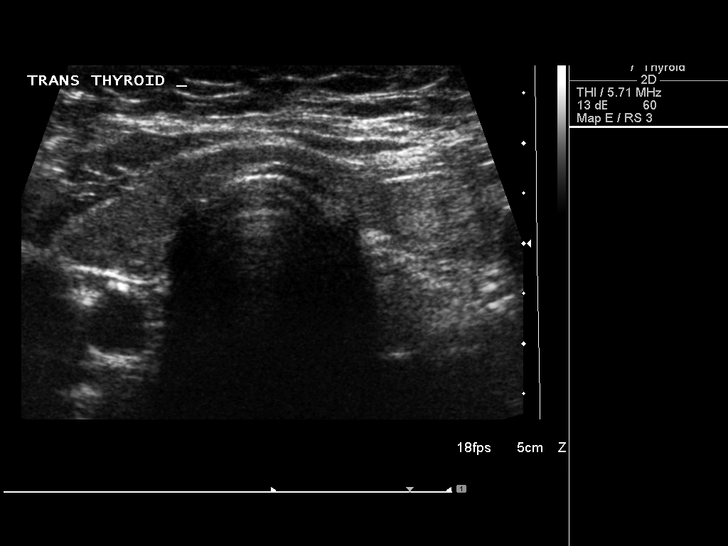

[14 of 25 positions shown; findings below may reference images not displayed]

FINDINGS: Right thyroid lobe:  4.1 x 1.2 x 1.3 cm.  (Previously 4.1 x 1.1 x
1.4 cm).
Left thyroid lobe:  4.1 x 1.2 x 1.3 cm.  (Previously 4.2 x 1.1 x
1.4 cm).
Isthmus:  3.9 mm in thickness.

Focal nodules:  The echogenicity of the thyroid gland is
homogeneous.  There are small nodules bilaterally.  A complex
nodule in the lower pole on the left measures 1.1 x 0.8 x 0.7 cm
with prior measurements of 1.1 x 0.8 x 0.8 cm.  A small solid
nodule in the lower pole on the left measures 0.7 x 0.8 x 0.8 cm
and is not definitely seen previously.  A small hypoechoic nodules
are noted on the right of no more than 3 mm in diameter.

Lymphadenopathy:  None visualized.
IMPRESSION: Stable thyroid nodules. An 8-mm nodule in the lower pole of left
was not definitely seen previously and continued follow-up is
recommended.

## 2013-03-11 ENCOUNTER — Encounter: Payer: Self-pay | Admitting: Pulmonary Disease

## 2013-03-11 ENCOUNTER — Ambulatory Visit (INDEPENDENT_AMBULATORY_CARE_PROVIDER_SITE_OTHER): Payer: Medicare Other | Admitting: Pulmonary Disease

## 2013-03-11 VITALS — BP 120/70 | HR 68 | Ht 62.0 in | Wt 243.0 lb

## 2013-03-11 DIAGNOSIS — K146 Glossodynia: Secondary | ICD-10-CM

## 2013-03-11 DIAGNOSIS — J449 Chronic obstructive pulmonary disease, unspecified: Secondary | ICD-10-CM

## 2013-03-11 DIAGNOSIS — R05 Cough: Secondary | ICD-10-CM

## 2013-03-11 DIAGNOSIS — R058 Other specified cough: Secondary | ICD-10-CM

## 2013-03-11 DIAGNOSIS — R059 Cough, unspecified: Secondary | ICD-10-CM

## 2013-03-11 NOTE — Progress Notes (Signed)
Chief Complaint  Patient presents with  . COPD with Asthma    Breathing is improved. Reports slight SOB, coughing. Denies chest tightness or wheezing.    History of Present Illness: [name] is a 73 y.o. female former smoker with dyspnea, and COPD with asthma.  Her breathing has improved.  She does not need to use albuterol much.  She still has cough and sputum, but this has improved.  She has noticed trouble with soreness in her tongue >> this was present before she started inhalers.  TESTS: V/Q scan 05/15/10 >> very low probabilty for pulmonary embolism Echo 03/01/11 >> mod LVH, EF 65 to 19%, grade 1 diastolic dysfx, mild LA dilation, mild RA/RV dilation PFT 05/28/11 >> FEV1 1.01 (54%), FEV1% 73, TLC 4.44 (100%), RV 2.74 (156%), DLCO 49%, +BD response   She  has a past medical history of Hypertension; PAD (peripheral artery disease); Hypothyroidism; Hypercholesteremia; Obesity; Shortness of breath; Hyperthyroidism; GERD (gastroesophageal reflux disease); Arthritis; Diabetes mellitus; Chronic obstructive asthma (05/28/2011); COPD (chronic obstructive pulmonary disease); Claudication; DOE (dyspnea on exertion); and SOB (shortness of breath).   She  has past surgical history that includes Femoral-femoral Bypass Graft (05/09/2010); Tubal ligation; Descending aortic aneurysm repair w/ stent; Percutaneous stent intervention (12/12/2010); Percutaneous stent intervention (04/20/2010); Percutaneous stent intervention (10/18/2008); Percutaneous stent intervention (09/14/2008); Cardiac catheterization (10/18/2008); and Cardiac catheterization (09/14/2008).   Current Outpatient Prescriptions on File Prior to Visit  Medication Sig Dispense Refill  . albuterol (PROAIR HFA) 108 (90 BASE) MCG/ACT inhaler Inhale 2 puffs into the lungs every 6 (six) hours as needed for wheezing.  1 Inhaler  5  . aspirin 81 MG chewable tablet Chew 81 mg by mouth daily.       . budesonide-formoterol (SYMBICORT) 160-4.5  MCG/ACT inhaler Inhale 2 puffs into the lungs 2 (two) times daily.  1 Inhaler  6  . Calcium-Magnesium-Vitamin D (CALCIUM MAGNESIUM PO) Take by mouth 2 (two) times daily.        . carvedilol (COREG) 6.25 MG tablet Take 12.5 mg by mouth 2 (two) times daily with a meal. 2 tablets .twice a day      . Cholecalciferol (VITAMIN D) 2000 UNITS tablet Take 2,000 Units by mouth daily.       Marland Kitchen glimepiride (AMARYL) 2 MG tablet Take 2 mg by mouth daily before breakfast.      . hydrOXYzine (ATARAX/VISTARIL) 25 MG tablet Take 25 mg by mouth 4 (four) times daily.       Marland Kitchen levothyroxine (SYNTHROID, LEVOTHROID) 88 MCG tablet Take 1 tablet by mouth daily before breakfast.       . lisinopril-hydrochlorothiazide (PRINZIDE,ZESTORETIC) 10-12.5 MG per tablet Take 1 tablet by mouth daily.      Marland Kitchen omeprazole (PRILOSEC) 40 MG capsule Take 40 mg by mouth daily.        . Travoprost, BAK Free, (TRAVATAN) 0.004 % SOLN ophthalmic solution Place 1 drop into both eyes at bedtime.       . triamcinolone (NASACORT AQ) 55 MCG/ACT AERO nasal inhaler Place 2 sprays into the nose daily.  1 Inhaler  12   No current facility-administered medications on file prior to visit.    Allergies  Allergen Reactions  . Glucophage [Metformin Hydrochloride] Itching and Rash    REPORTED BY PATIENT; STATES SYMPTOMS APPEARED AFTER THIRD DOES WITHIN THE LAST MONTH.  Marland Kitchen Alendronate Sodium Other (See Comments)    Unknown   . Avelox [Moxifloxacin Hcl In Nacl]     Itching,rash  . Latex  rash  . Metformin And Related   . Omnipaque [Iohexol] Other (See Comments)    Unknown   . Valsartan   . Adhesive [Tape] Rash  . Amoxicillin Itching and Rash  . Cephalexin Itching and Rash  . Contrast Media [Iodinated Diagnostic Agents] Itching and Rash  . Plavix [Clopidogrel Bisulfate] Itching and Rash    Physical Exam:  HEENT - No sinus tenderness, no oral exudate, no LAN  Cardiac - s1s2 regular, no murmur  Chest - prolonged exhalation, decreased breath  sounds, no wheeze/rales/dullness  Abdomen - obese, soft, nontender  Extremities - no e/c/c  Neurologic - normals strength Skin - no rashes  Psychiatric - normal mood, behavior   Assessment/Plan:  Chesley Mires, MD [place] 03/11/2013, 11:37 AM Pager:  [phone] After 3pm call: [phone]

## 2013-03-11 NOTE — Patient Instructions (Signed)
Follow up in 6 months 

## 2013-03-11 NOTE — Assessment & Plan Note (Signed)
No evidence for thrush.  Advised her to d/w her PCP.

## 2013-03-11 NOTE — Assessment & Plan Note (Signed)
Improved with addition of symbicort.  She is to also continue proair prn.  Defer additional testing for now since she has clinical improvement with inhaler therapy.

## 2013-03-11 NOTE — Assessment & Plan Note (Signed)
Continue nasal irrigation and nasacort.

## 2013-04-28 ENCOUNTER — Encounter: Payer: Self-pay | Admitting: Cardiovascular Disease

## 2013-04-28 ENCOUNTER — Ambulatory Visit (INDEPENDENT_AMBULATORY_CARE_PROVIDER_SITE_OTHER): Payer: Medicare Other | Admitting: Cardiovascular Disease

## 2013-04-28 VITALS — BP 132/64 | HR 67 | Ht 62.0 in | Wt 238.8 lb

## 2013-04-28 DIAGNOSIS — I10 Essential (primary) hypertension: Secondary | ICD-10-CM

## 2013-04-28 DIAGNOSIS — I7389 Other specified peripheral vascular diseases: Secondary | ICD-10-CM

## 2013-04-28 NOTE — Progress Notes (Signed)
04/28/2013 [name]   25-Nov-1940  [id number]  Primary Physician Salena Saner., MD Primary Cardiologist: Lorretta Harp MD Renae Gloss   HPI:  The patient is a 73 year old moderately overweight divorced Caucasian female, mother of 86, grandmother of 4 grandchildren, who I last saw in the office 3-1/2 months ago. She has a history of normal coronary arteries by catheterization, September 14, 2008, and normal LV systolic function with diastolic dysfunction. She has severe aortoiliac disease. I performed PTA and stenting of her right common iliac artery using a 10 x 4 SMART nitinol self-expanding stent. I attempted to recanalize her left common iliac artery unsuccessfully, and ultimately she underwent right-to-left femoral-femoral crossover grafting by Dr. Gae Gallop at my request. Her symptoms and ABIs have improved. Subsequently, it was determined in our lab that her femoral-femoral crossover graft had occluded, and on prior re-angiography she had in-stent restenosis, which I restented using an 8 x 38 iCAST covered stent with improvement in her Dopplers and ABIs. Her most recent Dopplers performed in October of 2014 revealed a right ABI 0.8 with a patent right common iliac artery stent, a left ABI of 0.49 with an occluded fem-fem crossover graft.She does complain of some dyspnea on exertion, which has not changed in frequency or severity. Her other problems include hypertension, hyperlipidemia, and non-insulin-requiring diabetes    Current Outpatient Prescriptions  Medication Sig Dispense Refill  . ACCU-CHEK AVIVA PLUS test strip       . albuterol (PROAIR HFA) 108 (90 BASE) MCG/ACT inhaler Inhale 2 puffs into the lungs every 6 (six) hours as needed for wheezing.  1 Inhaler  5  . aspirin 81 MG chewable tablet Chew 81 mg by mouth daily.       . budesonide-formoterol (SYMBICORT) 160-4.5 MCG/ACT inhaler Inhale 2 puffs into the lungs 2 (two) times daily.  1 Inhaler  6  .  Calcium-Magnesium-Vitamin D (CALCIUM MAGNESIUM PO) Take by mouth 2 (two) times daily.        . carvedilol (COREG) 6.25 MG tablet Take 6.25 mg by mouth 2 (two) times daily with a meal.       . Cholecalciferol (VITAMIN D) 2000 UNITS tablet Take 2,000 Units by mouth daily.       . diclofenac (VOLTAREN) 75 MG EC tablet Take 75 mg by mouth daily as needed.      Marland Kitchen glimepiride (AMARYL) 2 MG tablet Take 2 mg by mouth daily before breakfast.      . hydrOXYzine (ATARAX/VISTARIL) 25 MG tablet Take 25 mg by mouth 4 (four) times daily.       . Lancets (ACCU-CHEK MULTICLIX) lancets       . levothyroxine (SYNTHROID, LEVOTHROID) 88 MCG tablet Take 1 tablet by mouth daily before breakfast.       . lisinopril-hydrochlorothiazide (PRINZIDE,ZESTORETIC) 10-12.5 MG per tablet Take 1 tablet by mouth daily.      . metFORMIN (GLUCOPHAGE-XR) 500 MG 24 hr tablet Take 500 mg by mouth daily.      Marland Kitchen omeprazole (PRILOSEC) 40 MG capsule Take 40 mg by mouth daily.        . traMADol (ULTRAM) 50 MG tablet Take 50 mg by mouth daily as needed.      . Travoprost, BAK Free, (TRAVATAN) 0.004 % SOLN ophthalmic solution Place 1 drop into both eyes at bedtime.       . triamcinolone (NASACORT AQ) 55 MCG/ACT AERO nasal inhaler Place 2 sprays into the nose daily.  1 Inhaler  12  No current facility-administered medications for this visit.    Allergies  Allergen Reactions  . Glucophage [Metformin Hydrochloride] Itching and Rash    REPORTED BY PATIENT; STATES SYMPTOMS APPEARED AFTER THIRD DOES WITHIN THE LAST MONTH.  Marland Kitchen Alendronate Sodium Other (See Comments)    Unknown   . Avelox [Moxifloxacin Hcl In Nacl]     Itching,rash  . Latex     rash  . Metformin And Related   . Omnipaque [Iohexol] Other (See Comments)    Unknown   . Valsartan   . Adhesive [Tape] Rash  . Amoxicillin Itching and Rash  . Cephalexin Itching and Rash  . Contrast Media [Iodinated Diagnostic Agents] Itching and Rash  . Plavix [Clopidogrel Bisulfate] Itching  and Rash    History   Social History  . Marital Status: Divorced    Spouse Name: N/A    Number of Children: 3  . Years of Education: N/A   Occupational History  . retired    Social History Main Topics  . Smoking status: Former Smoker -- 2.00 packs/day for 47 years    Types: Cigarettes    Quit date: 01/17/2010  . Smokeless tobacco: Never Used  . Alcohol Use: No  . Drug Use: No  . Sexual Activity: Not Currently   Other Topics Concern  . Not on file   Social History Narrative  . No narrative on file     Review of Systems: General: negative for chills, fever, night sweats or weight changes.  Cardiovascular: negative for chest pain, dyspnea on exertion, edema, orthopnea, palpitations, paroxysmal nocturnal dyspnea or shortness of breath Dermatological: negative for rash Respiratory: negative for cough or wheezing Urologic: negative for hematuria Abdominal: negative for nausea, vomiting, diarrhea, bright red blood per rectum, melena, or hematemesis Neurologic: negative for visual changes, syncope, or dizziness All other systems reviewed and are otherwise negative except as noted above.    Blood pressure 132/64, pulse 67, height 5\' 2"  (1.575 m), weight 238 lb 12.8 oz (108.319 kg).  General appearance: alert and no distress Neck: no adenopathy, no carotid bruit, no JVD, supple, symmetrical, trachea midline and thyroid not enlarged, symmetric, no tenderness/mass/nodules Lungs: clear to auscultation bilaterally Heart: regular rate and rhythm, S1, S2 normal, no murmur, click, rub or gallop Extremities: extremities normal, atraumatic, no cyanosis or edema  EKG normal sinus rhythm at 67 without ST or T wave changes  ASSESSMENT AND PLAN:   HYPERTENSION Under good control and her medications  PVD WITH CLAUDICATION History of right common iliac artery stenting unsuccessful attempt at left iliac percutaneous intervention subtotally undergoing right to left fem-fem crossover  graft in by Dr. Gae Gallop at my request. She ultimately closed her fem-fem crossover graft and had restenosis of her right common iliac artery stent which I restented with an 8 mm x 30 mm ICAST  covered stent in 2012. This has remained stable by duplex ultrasound. She does complain of left leg pain but also had back issues as well.      Lorretta Harp MD FACP,FACC,FAHA, [facility] 04/28/2013 12:29 PM

## 2013-04-28 NOTE — Patient Instructions (Signed)
We request that you follow-up in: 6 months with Laura Ingold NP and in 1 year with Dr Berry  You will receive a reminder letter in the mail two months in advance. If you don't receive a letter, please call our office to schedule the follow-up appointment.   

## 2013-04-28 NOTE — Assessment & Plan Note (Signed)
History of right common iliac artery stenting unsuccessful attempt at left iliac percutaneous intervention subtotally undergoing right to left fem-fem crossover graft in by Dr. Gae Gallop at my request. She ultimately closed her fem-fem crossover graft and had restenosis of her right common iliac artery stent which I restented with an 8 mm x 30 mm ICAST  covered stent in 2012. This has remained stable by duplex ultrasound. She does complain of left leg pain but also had back issues as well.

## 2013-04-28 NOTE — Assessment & Plan Note (Signed)
Under good control and her medications 

## 2013-07-21 ENCOUNTER — Telehealth: Payer: Self-pay

## 2013-07-21 NOTE — Telephone Encounter (Signed)
Phone call from pt.  Stated she has been dealing with pain in her lower back, and was diagnosed with a pinched nerve.  C/o legs becoming weak, and left leg numb, after she has been standing awhile.  Stated she is not sure if her leg weakness/numbness is completely related to her pinched nerve, or if it is related to her known occluded bypass graft.   Denies any rest pain.  Denies any open sores.  Advised appt. is scheduled 08/05/13 @ 1:00 PM.  Pt. was not aware of her appt. 08/05/13.  Stated she will keep the appt. On 7/22; advised to call back if she develops worsening symptoms, rest pain, or open sore on lower extremity.  Verb. understanding.

## 2013-08-04 ENCOUNTER — Encounter: Payer: Self-pay | Admitting: Family

## 2013-08-05 ENCOUNTER — Ambulatory Visit (HOSPITAL_COMMUNITY)
Admission: RE | Admit: 2013-08-05 | Discharge: 2013-08-05 | Disposition: A | Discharge status: Home / Self Care | Payer: Medicare Other | Source: Ambulatory Visit | Attending: Family | Admitting: Family

## 2013-08-05 ENCOUNTER — Encounter: Payer: Self-pay | Admitting: Family

## 2013-08-05 ENCOUNTER — Ambulatory Visit (INDEPENDENT_AMBULATORY_CARE_PROVIDER_SITE_OTHER): Payer: Medicare Other | Admitting: Family

## 2013-08-05 VITALS — BP 144/79 | HR 68 | Temp 98.5°F | Resp 18 | Ht 62.0 in | Wt 237.0 lb

## 2013-08-05 DIAGNOSIS — I739 Peripheral vascular disease, unspecified: Secondary | ICD-10-CM | POA: Diagnosis present

## 2013-08-05 DIAGNOSIS — Z48812 Encounter for surgical aftercare following surgery on the circulatory system: Secondary | ICD-10-CM

## 2013-08-05 NOTE — Progress Notes (Signed)
[facility] [place] HISTORY AND PHYSICAL -PAD  History of Present Illness [name] is a 73 y.o. female patient of Dr. Scot Dock who is s/p right to left fem-fem bypass graft in 2012. Dr. Scot Dock saw her in July of 2014. At that time Dr. Scot Dock suspected that her right to left fem-fem graft was occluded although this was not a certainty. If this is occluded then is occluded suddenly at some point in the past. Regardless she has minimal claudication symptoms in the left leg in her activity is mostly limited by her back issues. She is not a smoker. Dr. Scot Dock encouraged her to stay as active as possible as her back allows. He discussed with pt potentially getting involved with water aerobics. Certainly she would be at very high risk for infection if she underwent a redo fem-fem bypass graft given her obesity.  She denies any rest pain or nonhealing ulcers.  She was recently diagnosed "with a pinched nerve in my low back", injections in her back have not helped, per pt. She has a tired feeling in posterior thighs after walking about 150 feet, but her walking is limited mostly limited by severe low back pain.  She is seeing PA Idelia Salm at [facility] for back pain.  Pt states Dr. Gwenlyn Found checks her carotid arteries by [place].   Pt denies history of stroke or TIA.  The patient denies New Medical or Surgical History.  Pt Diabetic: Yes Pt smoker: former smoker, quit in 2012  Pt meds include: Statin :No Betablocker: Yes ASA: Yes Other anticoagulants/antiplatelets: no  Past Medical History  Diagnosis Date  . Hypertension   . PAD (peripheral artery disease)   . Hypothyroidism   . Hypercholesteremia   . Obesity   . Shortness of breath   . Hyperthyroidism   . GERD (gastroesophageal reflux disease)   . Arthritis   . Diabetes mellitus   . Chronic obstructive asthma 05/28/2011  . COPD (chronic obstructive pulmonary disease)   . Claudication     LEA DOPPLER, 12/10/2011  - FEMORAL-FEMORAL BYPASS GRAFT-occluded; LEFT TIBIAL-appeared occluded;   . DOE (dyspnea on exertion)     NUCLEAR STRESS TEST, 08/03/2010 - EKG negative for ischemia  . SOB (shortness of breath)     2D ECHO, 03/01/2011 - EF 65-70%, moderate LVH,     Social History History  Substance Use Topics  . Smoking status: Former Smoker -- 2.00 packs/day for 47 years    Types: Cigarettes    Quit date: 01/17/2010  . Smokeless tobacco: Never Used  . Alcohol Use: No    Family History Family History  Problem Relation Age of Onset  . Diabetes Mother   . Heart disease Mother   . Hyperlipidemia Mother   . Cancer Sister   . Allergies Daughter     x3    Past Surgical History  Procedure Laterality Date  . Femoral-femoral bypass graft  05/09/2010    Right to Left Fem-Fem BPG by Dr. Scot Dock  . Tubal ligation    . Descending aortic aneurysm repair w/ stent      Stent repair  . Percutaneous stent intervention  12/12/2010    Rt common iliac stenosis stented with a 8x48mm ICAST covered stent resulting in a reduction of a 50% in-stent restenosis to 0%  . Percutaneous stent intervention  04/20/2010    Rt common iliac stenosis stented with a 8x18 [employer] OPTA stent balloon resulting in reduciton of a 70% stenosis to 0% residual  .  Percutaneous stent intervention  10/18/2008    No intervention  . Percutaneous stent intervention  09/14/2008    Rt common iliac stenosis stented with a 4x10 Cordis stent resulting in a reduction of 99% stenosis to less than 20% residual  . Cardiac catheterization  10/18/2008    No intervention  . Cardiac catheterization  09/14/2008    Continue medical therapy    Allergies  Allergen Reactions  . Glucophage [Metformin Hydrochloride] Itching and Rash    REPORTED BY PATIENT; STATES SYMPTOMS APPEARED AFTER THIRD DOES WITHIN THE LAST MONTH.  Marland Kitchen Alendronate Sodium Other (See Comments)    Unknown   . Avelox [Moxifloxacin Hcl In Nacl]     Itching,rash  . Latex     rash  .  Metformin And Related   . Omnipaque [Iohexol] Other (See Comments)    Unknown   . Valsartan   . Adhesive [Tape] Rash  . Amoxicillin Itching and Rash  . Cephalexin Itching and Rash  . Contrast Media [Iodinated Diagnostic Agents] Itching and Rash  . Plavix [Clopidogrel Bisulfate] Itching and Rash    Current Outpatient Prescriptions  Medication Sig Dispense Refill  . ACCU-CHEK AVIVA PLUS test strip       . albuterol (PROAIR HFA) 108 (90 BASE) MCG/ACT inhaler Inhale 2 puffs into the lungs every 6 (six) hours as needed for wheezing.  1 Inhaler  5  . aspirin 81 MG chewable tablet Chew 81 mg by mouth daily.       . budesonide-formoterol (SYMBICORT) 160-4.5 MCG/ACT inhaler Inhale 2 puffs into the lungs 2 (two) times daily.  1 Inhaler  6  . Calcium-Magnesium-Vitamin D (CALCIUM MAGNESIUM PO) Take by mouth 2 (two) times daily.        . carvedilol (COREG) 6.25 MG tablet Take 6.25 mg by mouth 2 (two) times daily with a meal.       . Cholecalciferol (VITAMIN D) 2000 UNITS tablet Take 2,000 Units by mouth daily.       . diclofenac (VOLTAREN) 75 MG EC tablet Take 75 mg by mouth daily as needed.      Marland Kitchen glimepiride (AMARYL) 2 MG tablet Take 2 mg by mouth daily before breakfast.      . hydrOXYzine (ATARAX/VISTARIL) 25 MG tablet Take 25 mg by mouth 4 (four) times daily.       . Lancets (ACCU-CHEK MULTICLIX) lancets       . levothyroxine (SYNTHROID, LEVOTHROID) 88 MCG tablet Take 1 tablet by mouth daily before breakfast.       . lisinopril-hydrochlorothiazide (PRINZIDE,ZESTORETIC) 10-12.5 MG per tablet Take 1 tablet by mouth daily.      . metFORMIN (GLUCOPHAGE-XR) 500 MG 24 hr tablet Take 500 mg by mouth daily.      Marland Kitchen omeprazole (PRILOSEC) 40 MG capsule Take 40 mg by mouth daily.        . traMADol (ULTRAM) 50 MG tablet Take 50 mg by mouth daily as needed.      . Travoprost, BAK Free, (TRAVATAN) 0.004 % SOLN ophthalmic solution Place 1 drop into both eyes at bedtime.       . triamcinolone (NASACORT AQ) 55  MCG/ACT AERO nasal inhaler Place 2 sprays into the nose daily.  1 Inhaler  12   No current facility-administered medications for this visit.    ROS: See HPI for pertinent positives and negatives.   Physical Examination   Filed Vitals:   08/05/13 1429  BP: 144/79  Pulse: 68  Temp: 98.5 F (36.9 C)  TempSrc: Oral  Resp: 18  Height: 5\' 2"  (1.575 m)  Weight: 237 lb (107.502 kg)  SpO2: 97%   Body mass index is 43.34 kg/(m^2).   General: A&O x 3, WDWN, morbidly obese female. Gait: limp Eyes: PERRLA. Pulmonary: CTAB, without wheezes , rales or rhonchi. Cardiac: regular Rythm , without detected murmur.        Aorta is not palpable. Radial pulses: are 2+ palpable and =                           VASCULAR EXAM: Extremities without ischemic changes  without Gangrene; without open wounds.                                                                                                          LE Pulses LEFT RIGHT       FEMORAL  not palpable  not palpable        POPLITEAL  not palpable   not palpable       POSTERIOR TIBIAL  not palpable   2+ palpable        DORSALIS PEDIS      ANTERIOR TIBIAL not palpable  not palpable    Abdomen: soft, NT, no masses, large panus. Skin: no rashes, no ulcers noted. Musculoskeletal: no muscle wasting or atrophy.  Neurologic: A&O X 3; Appropriate Affect ; SENSATION: normal; MOTOR FUNCTION:  moving all extremities equally, motor strength 5/5 throughout in UE's, 4/5 in LE's. Speech is fluent/normal. CN 2-12 grossly intact.   Non-Invasive Vascular Imaging: DATE: 08/05/2013 ABI: RIGHT 0.87, Waveforms: triphasic;  LEFT 0.61, Waveforms: monophasic Previous (06/25/12) ABI's: Right: 0.86, Left: 0.47  ASSESSMENT: ENZA SHONE is a 73 y.o. female who is s/p right to left fem-fem bypass graft in 2012. Her low back pain is evident with walking before her claudication symptoms are; her walking is more limited by her low back pain issues than  claudication. ABI's today indicate stable mild occlusive disease in the right leg and improved from severe to moderate arterial occlusive disease in her left leg. She has no non healing wounds.   PLAN:  I discussed in depth with the patient the nature of atherosclerosis, and emphasized the importance of maximal medical management including strict control of blood pressure, blood glucose, and lipid levels, obtaining regular exercise, and continued cessation of smoking.  The patient is aware that without maximal medical management the underlying atherosclerotic disease process will progress, limiting the benefit of any interventions.  Based on the patient's vascular studies and examination, pt will return to clinic in 1 year for ABI's.  The patient was given information about PAD including signs, symptoms, treatment, what symptoms should prompt the patient to seek immediate medical care, and risk reduction measures to take.  Clemon Chambers, RN, MSN, FNP-C [facility] Phone: [phone]  Clinic MD: Scot Dock  08/05/2013 3:06 PM

## 2013-08-05 NOTE — Patient Instructions (Signed)

## 2013-09-15 ENCOUNTER — Ambulatory Visit
Admission: RE | Admit: 2013-09-15 | Discharge: 2013-09-15 | Disposition: A | Discharge status: Home / Self Care | Payer: Medicare Other | Source: Ambulatory Visit | Attending: Endocrinology | Admitting: Endocrinology

## 2013-09-15 DIAGNOSIS — E049 Nontoxic goiter, unspecified: Secondary | ICD-10-CM

## 2013-09-22 ENCOUNTER — Other Ambulatory Visit: Payer: Self-pay | Admitting: Endocrinology

## 2013-09-22 DIAGNOSIS — E049 Nontoxic goiter, unspecified: Secondary | ICD-10-CM

## 2013-11-18 ENCOUNTER — Ambulatory Visit (INDEPENDENT_AMBULATORY_CARE_PROVIDER_SITE_OTHER): Payer: Medicare Other | Admitting: Pulmonary Disease

## 2013-11-18 ENCOUNTER — Encounter: Payer: Self-pay | Admitting: Pulmonary Disease

## 2013-11-18 VITALS — BP 120/78 | HR 82 | Ht 62.4 in | Wt 238.8 lb

## 2013-11-18 DIAGNOSIS — J4489 Other specified chronic obstructive pulmonary disease: Secondary | ICD-10-CM

## 2013-11-18 DIAGNOSIS — R058 Other specified cough: Secondary | ICD-10-CM

## 2013-11-18 DIAGNOSIS — R05 Cough: Secondary | ICD-10-CM

## 2013-11-18 DIAGNOSIS — R0600 Dyspnea, unspecified: Secondary | ICD-10-CM

## 2013-11-18 DIAGNOSIS — J449 Chronic obstructive pulmonary disease, unspecified: Secondary | ICD-10-CM

## 2013-11-18 NOTE — Patient Instructions (Signed)
Follow up in 6 months 

## 2013-11-18 NOTE — Assessment & Plan Note (Signed)
Continue nasal irrigation and nasacort as needed.

## 2013-11-18 NOTE — Progress Notes (Signed)
Chief Complaint  Patient presents with  . Follow-up    none    History of Present Illness: [name] is a 73 y.o. female former smoker with dyspnea, and COPD with asthma.  Her breathing has been doing okay. She gets occasional cough and wheeze >> from weather change or dust exposure >> better after proair.  She uses proair occasionally.  Symbicort helps.  She got her flu shot last month.  TESTS: V/Q scan 05/15/10 >> very low probabilty for pulmonary embolism Echo 03/01/11 >> mod LVH, EF 65 to 35%, grade 1 diastolic dysfx, mild LA dilation, mild RA/RV dilation PFT 05/28/11 >> FEV1 1.01 (54%), FEV1% 73, TLC 4.44 (100%), RV 2.74 (156%), DLCO 49%, +BD response  PMHx, PSHx, Medications, Allergies, Fhx, Shx reviewed.  Physical Exam:  HEENT - No sinus tenderness, no oral exudate, no LAN  Cardiac - s1s2 regular, no murmur  Chest - no wheeze/rales/dullness  Abdomen - obese, soft, nontender  Extremities - no edema Neurologic - normals strength Skin - no rashes  Psychiatric - normal mood, behavior   Assessment/Plan:  Chesley Mires, MD [place] 11/18/2013, 2:47 PM Pager:  [phone] After 3pm call: [phone]

## 2013-11-18 NOTE — Assessment & Plan Note (Signed)
Stable on current regimen of symbicort and prn albuterol.

## 2013-11-18 NOTE — Assessment & Plan Note (Signed)
Related to asthma, heart disease, peripheral artery disease, and deconditioning.

## 2013-12-23 ENCOUNTER — Encounter (HOSPITAL_COMMUNITY): Payer: Self-pay | Admitting: Cardiovascular Disease

## 2014-02-01 DIAGNOSIS — R5383 Other fatigue: Secondary | ICD-10-CM | POA: Diagnosis not present

## 2014-02-01 DIAGNOSIS — E559 Vitamin D deficiency, unspecified: Secondary | ICD-10-CM | POA: Diagnosis not present

## 2014-02-01 DIAGNOSIS — E0822 Diabetes mellitus due to underlying condition with diabetic chronic kidney disease: Secondary | ICD-10-CM | POA: Diagnosis not present

## 2014-02-01 DIAGNOSIS — N183 Chronic kidney disease, stage 3 (moderate): Secondary | ICD-10-CM | POA: Diagnosis not present

## 2014-02-01 DIAGNOSIS — E1122 Type 2 diabetes mellitus with diabetic chronic kidney disease: Secondary | ICD-10-CM | POA: Diagnosis not present

## 2014-02-01 DIAGNOSIS — E782 Mixed hyperlipidemia: Secondary | ICD-10-CM | POA: Diagnosis not present

## 2014-02-02 DIAGNOSIS — M545 Low back pain: Secondary | ICD-10-CM | POA: Diagnosis not present

## 2014-02-15 ENCOUNTER — Other Ambulatory Visit: Payer: Self-pay | Admitting: Pulmonary Disease

## 2014-02-16 DIAGNOSIS — M5416 Radiculopathy, lumbar region: Secondary | ICD-10-CM | POA: Diagnosis not present

## 2014-02-16 DIAGNOSIS — M545 Low back pain: Secondary | ICD-10-CM | POA: Diagnosis not present

## 2014-02-27 ENCOUNTER — Other Ambulatory Visit: Payer: Self-pay | Admitting: Pulmonary Disease

## 2014-03-15 ENCOUNTER — Telehealth: Payer: Self-pay | Admitting: Cardiovascular Disease

## 2014-03-15 NOTE — Telephone Encounter (Signed)
Pt c/o of a "cramping" pain in Right leg that ranges from her foot to her mid thigh. Pt also states that her big toe nail on that side is "purplish/bluish" and hurts to touch.   Advised pt will notate for Dr. Gwenlyn Found to review and contact her within the next 48 hours.

## 2014-03-15 NOTE — Telephone Encounter (Signed)
Pt called in stating that she has been having some really bad pain in her legs since Friday. She states that it is hard for her to walk and she would like to be advised on what to do. Please call  Thanks

## 2014-03-15 NOTE — Telephone Encounter (Signed)
Left message for patient to call office back.

## 2014-03-16 NOTE — Telephone Encounter (Signed)
Returned call to patient she stated she is having pain in right leg.Appointment scheduled with Dr.Berry 04/06/14 at 11:15 am.Stated she would like to be see Dr.Berry sooner.Dr.Berry's nurse out of office today will send message to her for a sooner appointment.

## 2014-03-16 NOTE — Telephone Encounter (Signed)
Pt is calling back to speak to someone about the pain in her legs

## 2014-03-18 ENCOUNTER — Other Ambulatory Visit: Payer: Self-pay | Admitting: Family

## 2014-03-21 ENCOUNTER — Emergency Department (HOSPITAL_COMMUNITY)
Admission: EM | Admit: 2014-03-21 | Discharge: 2014-03-21 | Disposition: A | Discharge status: Home / Self Care | Payer: Medicare Other | Attending: Emergency Medicine | Admitting: Emergency Medicine

## 2014-03-21 ENCOUNTER — Encounter (HOSPITAL_COMMUNITY): Payer: Self-pay | Admitting: Emergency Medicine

## 2014-03-21 DIAGNOSIS — E119 Type 2 diabetes mellitus without complications: Secondary | ICD-10-CM | POA: Diagnosis not present

## 2014-03-21 DIAGNOSIS — M1990 Unspecified osteoarthritis, unspecified site: Secondary | ICD-10-CM | POA: Insufficient documentation

## 2014-03-21 DIAGNOSIS — E039 Hypothyroidism, unspecified: Secondary | ICD-10-CM | POA: Insufficient documentation

## 2014-03-21 DIAGNOSIS — K219 Gastro-esophageal reflux disease without esophagitis: Secondary | ICD-10-CM | POA: Diagnosis not present

## 2014-03-21 DIAGNOSIS — J449 Chronic obstructive pulmonary disease, unspecified: Secondary | ICD-10-CM | POA: Diagnosis not present

## 2014-03-21 DIAGNOSIS — E669 Obesity, unspecified: Secondary | ICD-10-CM | POA: Insufficient documentation

## 2014-03-21 DIAGNOSIS — Z87891 Personal history of nicotine dependence: Secondary | ICD-10-CM | POA: Insufficient documentation

## 2014-03-21 DIAGNOSIS — I739 Peripheral vascular disease, unspecified: Secondary | ICD-10-CM | POA: Diagnosis not present

## 2014-03-21 DIAGNOSIS — M79609 Pain in unspecified limb: Secondary | ICD-10-CM

## 2014-03-21 DIAGNOSIS — Z7951 Long term (current) use of inhaled steroids: Secondary | ICD-10-CM | POA: Insufficient documentation

## 2014-03-21 DIAGNOSIS — Z880 Allergy status to penicillin: Secondary | ICD-10-CM | POA: Diagnosis not present

## 2014-03-21 DIAGNOSIS — M79604 Pain in right leg: Secondary | ICD-10-CM | POA: Diagnosis present

## 2014-03-21 DIAGNOSIS — Z91040 Latex allergy status: Secondary | ICD-10-CM | POA: Insufficient documentation

## 2014-03-21 DIAGNOSIS — Z7982 Long term (current) use of aspirin: Secondary | ICD-10-CM | POA: Diagnosis not present

## 2014-03-21 DIAGNOSIS — Z79899 Other long term (current) drug therapy: Secondary | ICD-10-CM | POA: Diagnosis not present

## 2014-03-21 DIAGNOSIS — I10 Essential (primary) hypertension: Secondary | ICD-10-CM | POA: Diagnosis not present

## 2014-03-21 MED ORDER — HYDROCODONE-ACETAMINOPHEN 5-325 MG PO TABS: 1.0000 | ORAL_TABLET | ORAL | Status: DC | PRN

## 2014-03-21 MED ORDER — MORPHINE SULFATE 4 MG/ML IJ SOLN: 6.0000 mg | Freq: Once | INTRAMUSCULAR | Status: DC

## 2014-03-21 MED ORDER — SODIUM CHLORIDE 0.9 % IV SOLN: INTRAVENOUS | Status: DC

## 2014-03-21 MED ORDER — MORPHINE SULFATE 4 MG/ML IJ SOLN
6.0000 mg | Freq: Once | INTRAMUSCULAR | Status: AC
  Administered 2014-03-21: 6 mg via INTRAMUSCULAR
  Filled 2014-03-21: qty 2

## 2014-03-21 NOTE — ED Notes (Signed)
Bed: WA23 Expected date:  Expected time:  Means of arrival:  Comments: Hold for triage 3 

## 2014-03-21 NOTE — ED Notes (Signed)
Pt from home c/o right leg pain (thigh and calf pain) since last Friday. She reports great toe pain and blue toe nails. Poor capillary response in right foot. She reports extreme pain when sitting or applying pressure to legs or feet.

## 2014-03-21 NOTE — Progress Notes (Signed)
VASCULAR LAB PRELIMINARY  ARTERIAL  ABI completed: Bilateral ABI indicates severe arterial disease.    RIGHT    LEFT    PRESSURE WAVEFORM  PRESSURE WAVEFORM  BRACHIAL 128 Tri BRACHIAL 125 Tri  DP   DP    AT 48 Damp mono AT 38 Damp mono  PT 51 Damp mono PT Not audible   PER   PER 36 Damp mono  GREAT TOE  NA GREAT TOE  NA    RIGHT LEFT  ABI 0.4 0.3     EUNICE, [name], RVT 03/21/2014, 3:26 PM

## 2014-03-21 NOTE — Discharge Instructions (Signed)
Dear left lower extremity ABI was 0.3. Your right lower extremity ABI was 0.4. Call Dr. Naida Sleight office tomorrow to schedule a follow-up visit. Return to the hospital for any color change or increased pain in your lower extremities

## 2014-03-21 NOTE — ED Notes (Addendum)
Dr Zenia Resides sts that he put orders in but does not want any fluids or blood drawn yet. Will hold until further notice

## 2014-03-21 NOTE — ED Provider Notes (Signed)
CSN: [id number]     Arrival date & time 03/21/14  1229 History   First MD Initiated Contact with Patient 03/21/14 1244     Chief Complaint  Patient presents with  . right leg pain   . Leg Swelling     (Consider location/radiation/quality/duration/timing/severity/associated sxs/prior Treatment) HPI Comments: Patient here with 10 day history of worsening lower extremity pain is worse with walking. Does have a prior history of vascular surgery due to insufficiency to her left lower extremity as well as percutaneous stenting to her right lower extremity. Patient saw her physician for similar symptoms 4 days ago and she has been referred for outpatient arterial vascular studies. She was also prescribed hydrocodone which she has been taking without affect. Denies any color change to her feet. No back pain. Symptoms are worse with rest.  The history is provided by the patient and a relative.    Past Medical History  Diagnosis Date  . Hypertension   . PAD (peripheral artery disease)   . Hypothyroidism   . Hypercholesteremia   . Obesity   . Shortness of breath   . Hyperthyroidism   . GERD (gastroesophageal reflux disease)   . Arthritis   . Diabetes mellitus   . Chronic obstructive asthma 05/28/2011  . COPD (chronic obstructive pulmonary disease)   . Claudication     LEA DOPPLER, 12/10/2011 - FEMORAL-FEMORAL BYPASS GRAFT-occluded; LEFT TIBIAL-appeared occluded;   . DOE (dyspnea on exertion)     NUCLEAR STRESS TEST, 08/03/2010 - EKG negative for ischemia  . SOB (shortness of breath)     2D ECHO, 03/01/2011 - EF 65-70%, moderate LVH,    Past Surgical History  Procedure Laterality Date  . Femoral-femoral bypass graft  05/09/2010    Right to Left Fem-Fem BPG by Dr. Scot Dock  . Tubal ligation    . Descending aortic aneurysm repair w/ stent      Stent repair  . Percutaneous stent intervention  12/12/2010    Rt common iliac stenosis stented with a 8x38mm ICAST covered stent resulting in a  reduction of a 50% in-stent restenosis to 0%  . Percutaneous stent intervention  04/20/2010    Rt common iliac stenosis stented with a 8x18 Genesis OPTA stent balloon resulting in reduciton of a 70% stenosis to 0% residual  . Percutaneous stent intervention  10/18/2008    No intervention  . Percutaneous stent intervention  09/14/2008    Rt common iliac stenosis stented with a 4x10 Cordis stent resulting in a reduction of 99% stenosis to less than 20% residual  . Cardiac catheterization  10/18/2008    No intervention  . Cardiac catheterization  09/14/2008    Continue medical therapy  . Abdominal angiogram Left 12/12/2010    Procedure: ABDOMINAL ANGIOGRAM;  Surgeon: Lorretta Harp, MD;  Location: [facility] CATH LAB;  Service: Cardiovascular;  Laterality: Left;  . Percutaneous stent intervention Right 12/12/2010    Procedure: PERCUTANEOUS STENT INTERVENTION;  Surgeon: Lorretta Harp, MD;  Location: [facility] CATH LAB;  Service: Cardiovascular;  Laterality: Right;   Family History  Problem Relation Age of Onset  . Diabetes Mother   . Heart disease Mother   . Hyperlipidemia Mother   . Cancer Sister   . Allergies Daughter     x3   History  Substance Use Topics  . Smoking status: Former Smoker -- 2.00 packs/day for 47 years    Types: Cigarettes    Quit date: 01/17/2010  . Smokeless tobacco: Never Used  . Alcohol  Use: No   OB History    No data available     Review of Systems  All other systems reviewed and are negative.     Allergies  Glucophage; Alendronate sodium; Avelox; Latex; Metformin and related; Omnipaque; Valsartan; Adhesive; Amoxicillin; Cephalexin; Contrast media; and Plavix  Home Medications   Prior to Admission medications   Medication Sig Start Date End Date Taking? Authorizing Provider  ACCU-CHEK AVIVA PLUS test strip  04/03/13   Historical Provider, MD  aspirin 81 MG chewable tablet Chew 81 mg by mouth daily.     Historical Provider, MD  Calcium-Magnesium-Vitamin D  (CALCIUM MAGNESIUM PO) Take by mouth 2 (two) times daily.      Historical Provider, MD  carvedilol (COREG) 6.25 MG tablet Take 6.25 mg by mouth 2 (two) times daily with a meal.     Historical Provider, MD  Cholecalciferol (VITAMIN D) 2000 UNITS tablet Take 2,000 Units by mouth daily.     Historical Provider, MD  diclofenac (VOLTAREN) 75 MG EC tablet Take 75 mg by mouth daily as needed. 04/24/13   Historical Provider, MD  glimepiride (AMARYL) 2 MG tablet Take 2 mg by mouth daily before breakfast.    Historical Provider, MD  hydrOXYzine (ATARAX/VISTARIL) 25 MG tablet Take 25 mg by mouth 4 (four) times daily.  10/04/12   Historical Provider, MD  Lancets (ACCU-CHEK MULTICLIX) lancets  04/03/13   Historical Provider, MD  levothyroxine (SYNTHROID, LEVOTHROID) 88 MCG tablet Take 1 tablet by mouth daily before breakfast.  07/10/12   Historical Provider, MD  lisinopril-hydrochlorothiazide (PRINZIDE,ZESTORETIC) 10-12.5 MG per tablet Take 1 tablet by mouth daily.    Historical Provider, MD  metFORMIN (GLUCOPHAGE-XR) 500 MG 24 hr tablet Take 500 mg by mouth daily. 03/31/13   Historical Provider, MD  omeprazole (PRILOSEC) 40 MG capsule Take 40 mg by mouth daily.      Historical Provider, MD  PROAIR HFA 108 (90 BASE) MCG/ACT inhaler INHALE 2 PUFFS INTO THE LUNGS EVERY 6 HOURS AS NEEDED FOR WHEEZING 02/18/14   Chesley Mires, MD  SYMBICORT 160-4.5 MCG/ACT inhaler INHALE 2 PUFFS INTO THE LUNGS TWICE DAILY 03/02/14   Chesley Mires, MD  traMADol (ULTRAM) 50 MG tablet Take 50 mg by mouth daily as needed. 04/24/13   Historical Provider, MD  Travoprost, BAK Free, (TRAVATAN) 0.004 % SOLN ophthalmic solution Place 1 drop into both eyes at bedtime.     Historical Provider, MD  triamcinolone (NASACORT AQ) 55 MCG/ACT AERO nasal inhaler Place 2 sprays into the nose daily. 01/28/13   Chesley Mires, MD   BP 132/81 mmHg  Pulse 50  Temp(Src) 97.6 F (36.4 C) (Oral)  Resp 18  SpO2 97% Physical Exam  Constitutional: She is oriented to  person, place, and time. She appears well-developed and well-nourished.  Non-toxic appearance. No distress.  HENT:  Head: Normocephalic and atraumatic.  Eyes: Conjunctivae, EOM and lids are normal. Pupils are equal, round, and reactive to light.  Neck: Normal range of motion. Neck supple. No tracheal deviation present. No thyroid mass present.  Cardiovascular: Normal rate, regular rhythm and normal heart sounds.  Exam reveals no gallop.   No murmur heard. Pulmonary/Chest: Effort normal and breath sounds normal. No stridor. No respiratory distress. She has no decreased breath sounds. She has no wheezes. She has no rhonchi. She has no rales.  Abdominal: Soft. Normal appearance and bowel sounds are normal. She exhibits no distension. There is no tenderness. There is no rebound and no CVA tenderness.  Musculoskeletal: Normal  range of motion. She exhibits no edema or tenderness.  No Bilateral lower extremity cyanosis. Dorsalis pedis pulses palpable bilaterally. Feet are warm to the touch. No obvious acute vascular occlusion noted. Cap refill less than 4 seconds at both toes. Neuro status intact  Neurological: She is alert and oriented to person, place, and time. She has normal strength. No cranial nerve deficit or sensory deficit. GCS eye subscore is 4. GCS verbal subscore is 5. GCS motor subscore is 6.  Skin: Skin is warm and dry. No abrasion and no rash noted.  Psychiatric: She has a normal mood and affect. Her speech is normal and behavior is normal.  Nursing note and vitals reviewed.   ED Course  Procedures (including critical care time) Labs Review Labs Reviewed - No data to display  Imaging Review No results found.   EKG Interpretation None      MDM   Final diagnoses:  None    Patient had arterial Dopplers and results noted. Right abi 0.4, left abi 0.3. Case discussed with Vascular surgeon on call and patient has been recommended to see her cardiologist for follow-up. She has no  signs of acute ischemic limb at this time. Strict return precautions given    Leota Jacobsen, MD 03/21/14 1544

## 2014-03-22 NOTE — Telephone Encounter (Signed)
Can this encounter be closed?

## 2014-03-23 ENCOUNTER — Ambulatory Visit (HOSPITAL_COMMUNITY)
Admission: RE | Admit: 2014-03-23 | Discharge: 2014-03-23 | Disposition: A | Discharge status: Home / Self Care | Payer: Medicare Other | Source: Ambulatory Visit | Attending: Cardiovascular Disease | Admitting: Cardiovascular Disease

## 2014-03-23 ENCOUNTER — Ambulatory Visit (INDEPENDENT_AMBULATORY_CARE_PROVIDER_SITE_OTHER): Payer: Medicare Other | Admitting: Cardiology

## 2014-03-23 ENCOUNTER — Encounter: Payer: Self-pay | Admitting: Cardiology

## 2014-03-23 VITALS — BP 112/60 | HR 104 | Ht 62.0 in | Wt 222.0 lb

## 2014-03-23 DIAGNOSIS — N289 Disorder of kidney and ureter, unspecified: Secondary | ICD-10-CM | POA: Insufficient documentation

## 2014-03-23 DIAGNOSIS — E669 Obesity, unspecified: Secondary | ICD-10-CM

## 2014-03-23 DIAGNOSIS — I739 Peripheral vascular disease, unspecified: Secondary | ICD-10-CM

## 2014-03-23 DIAGNOSIS — I480 Paroxysmal atrial fibrillation: Secondary | ICD-10-CM

## 2014-03-23 DIAGNOSIS — Z91041 Radiographic dye allergy status: Secondary | ICD-10-CM

## 2014-03-23 DIAGNOSIS — I70302 Unspecified atherosclerosis of unspecified type of bypass graft(s) of the extremities, left leg: Secondary | ICD-10-CM | POA: Insufficient documentation

## 2014-03-23 DIAGNOSIS — Z9889 Other specified postprocedural states: Secondary | ICD-10-CM | POA: Diagnosis not present

## 2014-03-23 DIAGNOSIS — J449 Chronic obstructive pulmonary disease, unspecified: Secondary | ICD-10-CM

## 2014-03-23 LAB — CBC
HCT: 37.9 % (ref 36.0–46.0)
Hemoglobin: 11.9 g/dL — ABNORMAL LOW (ref 12.0–15.0)
MCH: 23.2 pg — ABNORMAL LOW (ref 26.0–34.0)
MCHC: 31.4 g/dL (ref 30.0–36.0)
MCV: 73.9 fL — ABNORMAL LOW (ref 78.0–100.0)
MPV: 10.3 fL (ref 8.6–12.4)
Platelets: 372 10*3/uL (ref 150–400)
RBC: 5.13 MIL/uL — ABNORMAL HIGH (ref 3.87–5.11)
RDW: 20.2 % — ABNORMAL HIGH (ref 11.5–15.5)
WBC: 7.4 10*3/uL (ref 4.0–10.5)

## 2014-03-23 LAB — COMPREHENSIVE METABOLIC PANEL
ALT: 16 U/L (ref 0–35)
AST: 19 U/L (ref 0–37)
Albumin: 3.7 g/dL (ref 3.5–5.2)
Alkaline Phosphatase: 60 U/L (ref 39–117)
BUN: 16 mg/dL (ref 6–23)
CO2: 23 mEq/L (ref 19–32)
Calcium: 9.3 mg/dL (ref 8.4–10.5)
Chloride: 105 mEq/L (ref 96–112)
Creat: 1.02 mg/dL (ref 0.50–1.10)
Glucose, Bld: 92 mg/dL (ref 70–99)
Potassium: 4 mEq/L (ref 3.5–5.3)
Sodium: 138 mEq/L (ref 135–145)
Total Bilirubin: 0.3 mg/dL (ref 0.2–1.2)
Total Protein: 6.5 g/dL (ref 6.0–8.3)

## 2014-03-23 LAB — MAGNESIUM: Magnesium: 1.7 mg/dL (ref 1.5–2.5)

## 2014-03-23 MED ORDER — RIVAROXABAN 20 MG PO TABS: 20.0000 mg | ORAL_TABLET | Freq: Every day | ORAL | Status: DC

## 2014-03-23 NOTE — Assessment & Plan Note (Signed)
BMI 40 

## 2014-03-23 NOTE — Assessment & Plan Note (Signed)
RCIA stent, occluded LCIA, Aug 2010. R-L  FFBPG April 2012 ISR RCIA -PTA Nov 2012 Now presenting with claudication and occluded FFBPG by doppler

## 2014-03-23 NOTE — Assessment & Plan Note (Signed)
Rash-Treated with steroids prior to cath

## 2014-03-23 NOTE — Assessment & Plan Note (Signed)
Followed by Dr Halford Chessman, not a problem recently

## 2014-03-23 NOTE — Progress Notes (Signed)
Lower Extremity Arterial Duplex Completed. °[name],RVT °

## 2014-03-23 NOTE — Assessment & Plan Note (Addendum)
In AF today with CVR, not on anticoagulation

## 2014-03-23 NOTE — Patient Instructions (Addendum)
Your physician has requested that you have a lower extremity arterial exercise duplex. During this test, exercise and ultrasound are used to evaluate arterial blood flow in the legs. Allow one hour for this exam. There are no restrictions or special instructions.  Your physician recommends that you return for lab work.  Your physician recommends that you schedule a follow-up appointment and next phase of treatment  will depend on the results of your lab work.

## 2014-03-23 NOTE — Assessment & Plan Note (Signed)
Told by her PCP "she might need dialysis soon".

## 2014-03-23 NOTE — Progress Notes (Signed)
03/23/2014 [name]   Jul 09, 1940  [id number]  Primary Physician No primary care provider on file. Primary Cardiologist: Dr [name]  HPI:  Obese 74 y/o female seen by Dr [name] in the past. She has PVD and had RCIA PTA Aug 2010 follow by an unsuccessful attempt at [facility] Oct 2010. FFBPG was recommended but she never followed up. She presented again in April 2012 with RCIA ISR and had PTA followed by [facility] 05/06/10. Her last PVA was Nov 2012 when she had ISR of her RCIA Rx'd with PTA. In July she ABI's at Dr [name] office. Her Rt ABI was 0.87, Lt 0.61. No follow up was done.            The pt presented to the ED 03/21/14 with blue Rt great toe and claudication on Rt x 10 days. Dopplers at the hospital showed "severe disease". She was referred to the office for follow up. In the office she is noted to be in AF, she was unaware of this. She also told me at the end of the office visit that her PCP had mentioned she might have to go on dialysis soon. The SCr I have was from 2013- 1.34. In addition she has allergy to contrast with previous "rash" and has required steroids before her angiogram.    Current Outpatient Prescriptions  Medication Sig Dispense Refill  . amLODipine (NORVASC) 5 MG tablet Take 5 mg by mouth daily.    Marland Kitchen aspirin 81 MG chewable tablet Chew 81 mg by mouth daily.     . Calcium-Magnesium-Vitamin D (CALCIUM MAGNESIUM PO) Take by mouth 2 (two) times daily.      . carvedilol (COREG) 6.25 MG tablet Take 6.25 mg by mouth 2 (two) times daily with a meal.     . Cholecalciferol (VITAMIN D) 2000 UNITS tablet Take 2,000 Units by mouth daily.     Marland Kitchen glimepiride (AMARYL) 2 MG tablet Take 2 mg by mouth daily before breakfast.    . HYDROcodone-acetaminophen (NORCO/VICODIN) 5-325 MG per tablet Take 1-2 tablets by mouth every 4 (four) hours as needed. 20 tablet 0  . hydrOXYzine (ATARAX/VISTARIL) 25 MG tablet Take 25 mg by mouth 4 (four) times daily.     Marland Kitchen levothyroxine (SYNTHROID, LEVOTHROID) 88  MCG tablet Take 1 tablet by mouth daily before breakfast.     . omeprazole (PRILOSEC) 40 MG capsule Take 40 mg by mouth daily.      Marland Kitchen PROAIR HFA 108 (90 BASE) MCG/ACT inhaler INHALE 2 PUFFS INTO THE LUNGS EVERY 6 HOURS AS NEEDED FOR WHEEZING 8.5 g 2  . SYMBICORT 160-4.5 MCG/ACT inhaler INHALE 2 PUFFS INTO THE LUNGS TWICE DAILY 10.2 g 0  . Travoprost, BAK Free, (TRAVATAN) 0.004 % SOLN ophthalmic solution Place 1 drop into both eyes at bedtime.     Marland Kitchen VICODIN 5-300 MG TABS Take 2 tablets by mouth every 4 (four) hours.  0   No current facility-administered medications for this visit.    Allergies  Allergen Reactions  . Glucophage [Metformin Hydrochloride] Itching and Rash    REPORTED BY PATIENT; STATES SYMPTOMS APPEARED AFTER THIRD DOES WITHIN THE LAST MONTH.  Marland Kitchen Alendronate Sodium Other (See Comments)    Unknown   . Avelox [Moxifloxacin Hcl In Nacl]     Itching,rash  . Latex     rash  . Metformin And Related   . Omnipaque [Iohexol] Other (See Comments)    Unknown   . Valsartan   . Adhesive [Tape] Rash  .  Amoxicillin Itching and Rash  . Cephalexin Itching and Rash  . Contrast Media [Iodinated Diagnostic Agents] Itching and Rash  . Plavix [Clopidogrel Bisulfate] Itching and Rash    History   Social History  . Marital Status: Divorced    Spouse Name: N/A  . Number of Children: 3  . Years of Education: N/A   Occupational History  . retired    Social History Main Topics  . Smoking status: Former Smoker -- 2.00 packs/day for 47 years    Types: Cigarettes    Quit date: 01/17/2010  . Smokeless tobacco: Never Used  . Alcohol Use: No  . Drug Use: No  . Sexual Activity: Not Currently   Other Topics Concern  . Not on file   Social History Narrative     Review of Systems: General: negative for chills, fever, night sweats or weight changes.  Cardiovascular: negative for chest pain, dyspnea on exertion, edema, orthopnea, palpitations, paroxysmal nocturnal dyspnea or  shortness of breath Dermatological: negative for rash Respiratory: negative for cough or wheezing Urologic: negative for hematuria Abdominal: negative for nausea, vomiting, diarrhea, bright red blood per rectum, melena, or hematemesis Neurologic: negative for visual changes, syncope, or dizziness All other systems reviewed and are otherwise negative except as noted above.    Blood pressure 112/60, pulse 104, height 5\' 2"  (1.575 m), weight 222 lb (100.699 kg).  General appearance: alert, cooperative, no distress and morbidly obese Neck: no carotid bruit and no JVD Lungs: clear to auscultation bilaterally Heart: irregularly irregular rhythm Abdomen: obese Extremities: diminiished pulses bilat. Blueish discoloration Rt great toe.  Pulses: diminnished Skin: pale, cool, dry Neurologic: Grossly normal  EKG AF with CVR  ASSESSMENT AND PLAN:   PVD WITH CLAUDICATION RCIA stent, occluded LCIA, Aug 2010. R-L  FFBPG April 2012 ISR RCIA -PTA Nov 2012 Now presenting with claudication and occluded FFBPG by doppler   PAF (paroxysmal atrial fibrillation) In AF today with CVR, not on anticoagulation   COPD with asthma Followed by Dr [name], not a problem recently   H/O cardiac catheterization, 2010, no significant CAD after Positive stress test .   Obesity BMI 40   Renal insufficiency Told by her PCP "she might need dialysis soon".    Contrast media allergy Rash-Treated with steroids prior to cath     PLAN  Pt seen by Dr [name] and myself. Dopplers in the office today suggest occlusion of the FFBPG. We will get labs and start Coumadin for AF pending her CBC. She will need PVA pending her renal function.  [name] KPA-C 03/23/2014 5:11 PM

## 2014-03-24 LAB — TSH: TSH: 2.83 u[IU]/mL (ref 0.350–4.500)

## 2014-03-24 NOTE — Telephone Encounter (Signed)
Patient was seen in the office 03/23/14.

## 2014-03-25 ENCOUNTER — Encounter: Payer: Self-pay | Admitting: Cardiovascular Disease

## 2014-03-25 ENCOUNTER — Telehealth: Payer: Self-pay | Admitting: *Deleted

## 2014-03-25 DIAGNOSIS — I739 Peripheral vascular disease, unspecified: Secondary | ICD-10-CM

## 2014-03-25 NOTE — Telephone Encounter (Signed)
Dr Gwenlyn Found reviewed [facility] lower extremity dopplers and blood work.  He wants to proceed with a pv angio on 3/21.  I spoke with patient and she is agreeable to proceed.  She verbalized understanding that she needed to contact me ASAP if she developed any change in her symptoms or a fever.

## 2014-03-26 ENCOUNTER — Telehealth: Payer: Self-pay | Admitting: Cardiovascular Disease

## 2014-03-26 NOTE — Telephone Encounter (Signed)
lmsg for pt to call.  She needs date and time for pv angio and her instructions. Her labs will be done day of.

## 2014-03-29 ENCOUNTER — Telehealth: Payer: Self-pay | Admitting: Cardiovascular Disease

## 2014-03-29 ENCOUNTER — Telehealth: Payer: Self-pay | Admitting: *Deleted

## 2014-03-29 DIAGNOSIS — I739 Peripheral vascular disease, unspecified: Secondary | ICD-10-CM

## 2014-03-29 DIAGNOSIS — D689 Coagulation defect, unspecified: Secondary | ICD-10-CM

## 2014-03-29 DIAGNOSIS — Z79899 Other long term (current) drug therapy: Secondary | ICD-10-CM

## 2014-03-29 NOTE — Telephone Encounter (Signed)
Spoke with patient regarding CTA abd/pelvis that was ordered by Dr. Gwenlyn Found.  Scheduled for 03/31/14 at 9:30 am arrival time 9:15 am at the 1st floor radiology department at [facility]..  NPO after midnight---patient voiced her understanding.

## 2014-03-29 NOTE — Progress Notes (Signed)
OK 

## 2014-03-29 NOTE — Telephone Encounter (Signed)
-----   Message from Erlene Quan, [place] sent at 03/27/2014  7:45 AM EST ----- Looks like her SCr and Hgb is OK. She should have started on Coumadin and set up for PVA with JB.  [name]

## 2014-03-29 NOTE — Telephone Encounter (Signed)
Patient notifeid of Dr Kennon Holter recommendation for a CT angio (see results note for LE doppler).  Order placed and repeat labs ordered.

## 2014-03-31 ENCOUNTER — Other Ambulatory Visit: Payer: Self-pay | Admitting: *Deleted

## 2014-03-31 ENCOUNTER — Telehealth: Payer: Self-pay | Admitting: Cardiovascular Disease

## 2014-03-31 ENCOUNTER — Encounter (HOSPITAL_COMMUNITY): Payer: Self-pay

## 2014-03-31 ENCOUNTER — Ambulatory Visit (HOSPITAL_COMMUNITY)
Admission: RE | Admit: 2014-03-31 | Discharge: 2014-03-31 | Disposition: A | Discharge status: Home / Self Care | Payer: Medicare Other | Source: Ambulatory Visit | Attending: Cardiovascular Disease | Admitting: Cardiovascular Disease

## 2014-03-31 DIAGNOSIS — K5730 Diverticulosis of large intestine without perforation or abscess without bleeding: Secondary | ICD-10-CM | POA: Diagnosis not present

## 2014-03-31 DIAGNOSIS — I739 Peripheral vascular disease, unspecified: Secondary | ICD-10-CM | POA: Diagnosis present

## 2014-03-31 DIAGNOSIS — I700 Atherosclerosis of aorta: Secondary | ICD-10-CM | POA: Diagnosis not present

## 2014-03-31 DIAGNOSIS — I70203 Unspecified atherosclerosis of native arteries of extremities, bilateral legs: Secondary | ICD-10-CM | POA: Insufficient documentation

## 2014-03-31 DIAGNOSIS — I701 Atherosclerosis of renal artery: Secondary | ICD-10-CM | POA: Insufficient documentation

## 2014-03-31 DIAGNOSIS — K429 Umbilical hernia without obstruction or gangrene: Secondary | ICD-10-CM | POA: Diagnosis not present

## 2014-03-31 DIAGNOSIS — Z91041 Radiographic dye allergy status: Secondary | ICD-10-CM

## 2014-03-31 DIAGNOSIS — I70301 Unspecified atherosclerosis of unspecified type of bypass graft(s) of the extremities, right leg: Secondary | ICD-10-CM | POA: Insufficient documentation

## 2014-03-31 MED ORDER — DIPHENHYDRAMINE HCL 50 MG/ML IJ SOLN: 25.0000 mg | Freq: Once | INTRAMUSCULAR | Status: DC

## 2014-03-31 MED ORDER — SODIUM CHLORIDE 0.9 % IV SOLN: 20.0000 mg | INTRAVENOUS | Status: AC

## 2014-03-31 MED ORDER — FAMOTIDINE IN NACL 20-0.9 MG/50ML-% IV SOLN
20.0000 mg | Freq: Once | INTRAVENOUS | Status: AC
  Administered 2014-03-31: 20 mg via INTRAVENOUS
  Filled 2014-03-31: qty 50

## 2014-03-31 MED ORDER — DIPHENHYDRAMINE HCL 50 MG/ML IJ SOLN
25.0000 mg | Freq: Once | INTRAMUSCULAR | Status: AC
  Administered 2014-03-31: 25 mg via INTRAVENOUS
  Filled 2014-03-31: qty 0.5

## 2014-03-31 MED ORDER — IOHEXOL 350 MG/ML SOLN
80.0000 mL | Freq: Once | INTRAVENOUS | Status: AC | PRN
  Administered 2014-03-31: 100 mL via INTRAVENOUS

## 2014-03-31 MED ORDER — SODIUM CHLORIDE 0.9 % IV SOLN: 60.0000 mg | INTRAVENOUS | Status: AC

## 2014-03-31 MED ORDER — METHYLPREDNISOLONE SODIUM SUCC 125 MG IJ SOLR
60.0000 mg | Freq: Once | INTRAMUSCULAR | Status: AC
  Administered 2014-03-31: 60 mg via INTRAVENOUS
  Filled 2014-03-31: qty 0.96

## 2014-03-31 NOTE — Telephone Encounter (Signed)
CT tech called, pt is on schedule today for CT angio abd & pelvis, has a contrast allergy documented.   They want to reschedule the patient if premedication is required. Noted no premed orders seen on order review.

## 2014-03-31 NOTE — Telephone Encounter (Signed)
Spoke w/ Curt Bears who put in orders for IV med prophylaxis, verbally OK'd by Dr. Gwenlyn Found. Advised radiology orders ready.

## 2014-04-01 ENCOUNTER — Other Ambulatory Visit: Payer: Self-pay | Admitting: Pulmonary Disease

## 2014-04-01 LAB — PROTIME-INR
INR: 0.99 (ref <1.50)
Prothrombin Time: 13.1 seconds (ref 11.6–15.2)

## 2014-04-01 LAB — APTT: APTT: 27 s (ref 24–37)

## 2014-04-01 NOTE — Telephone Encounter (Signed)
Last office visit 11/18/13 no pending appt. Patient asked to schedule 6 mos f/u on AVS. Refills until May 2016 given.

## 2014-04-02 ENCOUNTER — Other Ambulatory Visit: Payer: Self-pay | Admitting: *Deleted

## 2014-04-02 ENCOUNTER — Telehealth: Payer: Self-pay | Admitting: *Deleted

## 2014-04-02 DIAGNOSIS — I739 Peripheral vascular disease, unspecified: Secondary | ICD-10-CM

## 2014-04-02 DIAGNOSIS — Z01812 Encounter for preprocedural laboratory examination: Secondary | ICD-10-CM

## 2014-04-02 LAB — BASIC METABOLIC PANEL
BUN: 20 mg/dL (ref 6–23)
CO2: 23 mEq/L (ref 19–32)
CREATININE: 0.98 mg/dL (ref 0.50–1.10)
Calcium: 10 mg/dL (ref 8.4–10.5)
Chloride: 105 mEq/L (ref 96–112)
GLUCOSE: 80 mg/dL (ref 70–99)
POTASSIUM: 4.6 meq/L (ref 3.5–5.3)
Sodium: 139 mEq/L (ref 135–145)

## 2014-04-02 LAB — CBC
HCT: 39.9 % (ref 36.0–46.0)
Hemoglobin: 12.1 g/dL (ref 12.0–15.0)
MCH: 23.3 pg — ABNORMAL LOW (ref 26.0–34.0)
MCHC: 30.3 g/dL (ref 30.0–36.0)
MCV: 76.9 fL — ABNORMAL LOW (ref 78.0–100.0)
MPV: 10.5 fL (ref 8.6–12.4)
Platelets: 421 10*3/uL — ABNORMAL HIGH (ref 150–400)
RBC: 5.19 MIL/uL — AB (ref 3.87–5.11)
RDW: 20.3 % — AB (ref 11.5–15.5)
WBC: 20.1 10*3/uL — AB (ref 4.0–10.5)

## 2014-04-02 NOTE — Progress Notes (Signed)
Order placed for pv angio

## 2014-04-02 NOTE — Telephone Encounter (Signed)
I called patient and reviewed the CT results with her. (Dr Gwenlyn Found called me and told me that she had a distal aorta occlusion and he would have to go in radially) she verbalized understanding.  I also routed the Ct to her pcp per her request for liver follow up.

## 2014-04-05 ENCOUNTER — Ambulatory Visit (HOSPITAL_COMMUNITY)
Admission: RE | Admit: 2014-04-05 | Discharge: 2014-04-05 | Disposition: A | Discharge status: Home / Self Care | Payer: Medicare Other | Source: Ambulatory Visit | Attending: Cardiovascular Disease | Admitting: Cardiovascular Disease

## 2014-04-05 ENCOUNTER — Encounter: Payer: Self-pay | Admitting: *Deleted

## 2014-04-05 ENCOUNTER — Encounter (HOSPITAL_COMMUNITY)
Admission: RE | Disposition: A | Discharge status: Home / Self Care | Payer: Self-pay | Source: Ambulatory Visit | Attending: Cardiovascular Disease

## 2014-04-05 ENCOUNTER — Encounter (HOSPITAL_COMMUNITY): Payer: Self-pay | Admitting: *Deleted

## 2014-04-05 DIAGNOSIS — I7409 Other arterial embolism and thrombosis of abdominal aorta: Secondary | ICD-10-CM | POA: Diagnosis not present

## 2014-04-05 DIAGNOSIS — Z79899 Other long term (current) drug therapy: Secondary | ICD-10-CM | POA: Insufficient documentation

## 2014-04-05 DIAGNOSIS — N289 Disorder of kidney and ureter, unspecified: Secondary | ICD-10-CM | POA: Insufficient documentation

## 2014-04-05 DIAGNOSIS — I4891 Unspecified atrial fibrillation: Secondary | ICD-10-CM | POA: Diagnosis not present

## 2014-04-05 DIAGNOSIS — Z6841 Body Mass Index (BMI) 40.0 and over, adult: Secondary | ICD-10-CM | POA: Diagnosis not present

## 2014-04-05 DIAGNOSIS — Z7982 Long term (current) use of aspirin: Secondary | ICD-10-CM | POA: Diagnosis not present

## 2014-04-05 DIAGNOSIS — J449 Chronic obstructive pulmonary disease, unspecified: Secondary | ICD-10-CM | POA: Diagnosis not present

## 2014-04-05 DIAGNOSIS — I70219 Atherosclerosis of native arteries of extremities with intermittent claudication, unspecified extremity: Secondary | ICD-10-CM | POA: Diagnosis present

## 2014-04-05 DIAGNOSIS — Z95820 Peripheral vascular angioplasty status with implants and grafts: Secondary | ICD-10-CM | POA: Diagnosis not present

## 2014-04-05 DIAGNOSIS — Z01812 Encounter for preprocedural laboratory examination: Secondary | ICD-10-CM

## 2014-04-05 DIAGNOSIS — I739 Peripheral vascular disease, unspecified: Secondary | ICD-10-CM | POA: Insufficient documentation

## 2014-04-05 DIAGNOSIS — J45909 Unspecified asthma, uncomplicated: Secondary | ICD-10-CM | POA: Diagnosis not present

## 2014-04-05 DIAGNOSIS — E663 Overweight: Secondary | ICD-10-CM | POA: Insufficient documentation

## 2014-04-05 HISTORY — PX: LOWER EXTREMITY ANGIOGRAM: SHX5508

## 2014-04-05 LAB — GLUCOSE, CAPILLARY: Glucose-Capillary: 148 mg/dL — ABNORMAL HIGH (ref 70–99)

## 2014-04-05 SURGERY — ANGIOGRAM, LOWER EXTREMITY
Anesthesia: LOCAL

## 2014-04-05 MED ORDER — ASPIRIN 81 MG PO CHEW
CHEWABLE_TABLET | ORAL | Status: AC
  Filled 2014-04-05: qty 1

## 2014-04-05 MED ORDER — ONDANSETRON HCL 4 MG/2ML IJ SOLN: 4.0000 mg | Freq: Four times a day (QID) | INTRAMUSCULAR | Status: DC | PRN

## 2014-04-05 MED ORDER — HYDROXYZINE HCL 25 MG PO TABS: 25.0000 mg | ORAL_TABLET | Freq: Four times a day (QID) | ORAL | Status: DC | PRN

## 2014-04-05 MED ORDER — DIPHENHYDRAMINE HCL 50 MG/ML IJ SOLN
25.0000 mg | INTRAMUSCULAR | Status: AC
  Administered 2014-04-05: 25 mg via INTRAVENOUS

## 2014-04-05 MED ORDER — HEPARIN (PORCINE) IN NACL 2-0.9 UNIT/ML-% IJ SOLN
INTRAMUSCULAR | Status: AC
  Filled 2014-04-05: qty 1000

## 2014-04-05 MED ORDER — HEPARIN SODIUM (PORCINE) 1000 UNIT/ML IJ SOLN
INTRAMUSCULAR | Status: AC
  Filled 2014-04-05: qty 1

## 2014-04-05 MED ORDER — ASPIRIN 81 MG PO CHEW
81.0000 mg | CHEWABLE_TABLET | ORAL | Status: AC
  Administered 2014-04-05: 81 mg via ORAL

## 2014-04-05 MED ORDER — MIDAZOLAM HCL 2 MG/2ML IJ SOLN
INTRAMUSCULAR | Status: AC
  Filled 2014-04-05: qty 2

## 2014-04-05 MED ORDER — MORPHINE SULFATE 2 MG/ML IJ SOLN: 1.0000 mg | INTRAMUSCULAR | Status: DC | PRN

## 2014-04-05 MED ORDER — FAMOTIDINE IN NACL 20-0.9 MG/50ML-% IV SOLN
20.0000 mg | INTRAVENOUS | Status: AC
  Administered 2014-04-05: 20 mg via INTRAVENOUS

## 2014-04-05 MED ORDER — SODIUM CHLORIDE 0.9 % IV SOLN
INTRAVENOUS | Status: DC
  Administered 2014-04-05: 09:00:00 via INTRAVENOUS

## 2014-04-05 MED ORDER — FAMOTIDINE IN NACL 20-0.9 MG/50ML-% IV SOLN
INTRAVENOUS | Status: DC
Start: 2014-04-05 — End: 2014-04-05
  Filled 2014-04-05: qty 50

## 2014-04-05 MED ORDER — DIPHENHYDRAMINE HCL 50 MG/ML IJ SOLN
INTRAMUSCULAR | Status: AC
  Filled 2014-04-05: qty 1

## 2014-04-05 MED ORDER — LIDOCAINE HCL (PF) 1 % IJ SOLN
INTRAMUSCULAR | Status: AC
  Filled 2014-04-05: qty 30

## 2014-04-05 MED ORDER — METHYLPREDNISOLONE SODIUM SUCC 125 MG IJ SOLR
60.0000 mg | INTRAMUSCULAR | Status: AC
  Administered 2014-04-05: 60 mg via INTRAVENOUS

## 2014-04-05 MED ORDER — NITROGLYCERIN 1 MG/10 ML FOR IR/CATH LAB
INTRA_ARTERIAL | Status: AC
  Filled 2014-04-05: qty 10

## 2014-04-05 MED ORDER — SODIUM CHLORIDE 0.9 % IJ SOLN: 3.0000 mL | INTRAMUSCULAR | Status: DC | PRN

## 2014-04-05 MED ORDER — FENTANYL CITRATE 0.05 MG/ML IJ SOLN
INTRAMUSCULAR | Status: AC
  Filled 2014-04-05: qty 2

## 2014-04-05 MED ORDER — ACETAMINOPHEN 325 MG PO TABS: 650.0000 mg | ORAL_TABLET | ORAL | Status: DC | PRN

## 2014-04-05 MED ORDER — RIVAROXABAN 20 MG PO TABS: 20.0000 mg | ORAL_TABLET | Freq: Every day | ORAL | Status: DC

## 2014-04-05 MED ORDER — VERAPAMIL HCL 2.5 MG/ML IV SOLN
INTRAVENOUS | Status: AC
  Filled 2014-04-05: qty 2

## 2014-04-05 MED ORDER — METHYLPREDNISOLONE SODIUM SUCC 125 MG IJ SOLR
INTRAMUSCULAR | Status: AC
  Filled 2014-04-05: qty 2

## 2014-04-05 MED ORDER — ASPIRIN EC 325 MG PO TBEC
325.0000 mg | DELAYED_RELEASE_TABLET | Freq: Every day | ORAL | Status: DC
Start: 2014-04-06 — End: 2014-04-05

## 2014-04-05 NOTE — CV Procedure (Signed)
[name] is a 73 y.o. female    [id number] LOCATION:  FACILITY: [facility]  PHYSICIAN: Quay Burow, M.D. [birth year]   DATE OF PROCEDURE:  04/05/2014  DATE OF DISCHARGE:     PV Angiogram/Intervention    History obtained from chart review. [name] is a 74 year old moderately overweight female with a history of peripheral vascular disease status post right iliac stenting in the past with a known occluded left iliac artery status post right to left femorofemoral crossover grafting by Dr. Scot Dock. She has had new onset A. Fib as well as critical limb ischemia with a painful right foot and Dopplers in our office that suggested ABIs in the 0.3 range with occluded iliac arteries. A CT angiogram performed last week confirmed distal, aortic occlusion with reconstitution in the iliac arteries. She presents now for abdominal aortography and bilateral iliac angiography to further define her anatomy prior to potential aortobifemoral bypass grafting for critical limb ischemia.   PROCEDURE DESCRIPTION:   The patient was brought to the second floor [facility] lab in the postabsorptive state. She was premedicated with IV Versed and fentanyl, IV Pepcid, Benadryl and Solu-Medrol for contrast allergyt. Her right wristwas prepped and shaved in usual sterile fashion. Xylocaine 1% was used for local anesthesia. A 5/6 French sheath was inserted into the right radial  artery using standard Seldinger technique. The patient received 5000 units  of heparin  intravenously.  Radial cocktail was admitted to the SideArm sheath. A 5 French pigtail catheter was then placed over a 035 per sacral wire into the descending abdominal aorta and abdominal aortography with bilateral iliac angiography was performed. Visipaque dye was used for the entirety of the case. Retrograde aortic pressure was monitored during the case.    HEMODYNAMICS:    AO SYSTOLIC/AO DIASTOLIC: 409/81   Angiographic Data:   1:  Abdominal aortogram-the renal arteries widely patent. The infrarenal abdominal  aorta was occluded below the renal arteries. The external iliac arteries filled by IMA and lumbar collaterals. The common femoral/ proximal SFA's are widely patent.  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 Scot Dock , the vascular surgeon who performed her right to left femorofemoral bypass graft procedure. The sheath was removed and a TR band was placed on the right wrist which is patent hemostasis. The patient left the lab in stable condition. She'll be hydrated for the next several hours and discharged home. Because of her newly diagnosed atrial fibrillation she will need to be started on a novel oral anticoagulant prior to discharge. We will also obtain a pharmacologic Myoview stress test later this week to risk stratify her prior to her vascular surgical procedure.   Lorretta Harp MD, [facility] 04/05/2014 9:56 AM

## 2014-04-05 NOTE — H&P (View-Only) (Signed)
OK 

## 2014-04-05 NOTE — Progress Notes (Signed)
TR BAND REMOVAL  LOCATION:    right radial  DEFLATED PER PROTOCOL:    Yes.    TIME BAND OFF / DRESSING APPLIED:    1135  SITE UPON ARRIVAL:    Level 0  SITE AFTER BAND REMOVAL:    Level 0  CIRCULATION SENSATION AND MOVEMENT:    Within Normal Limits   Yes.    COMMENTS:  TRB REMOVED/ TEGADERM DSG APPLIED

## 2014-04-05 NOTE — Discharge Instructions (Signed)
Radial Site Care Refer to this sheet in the next few weeks. These instructions provide you with information on caring for yourself after your procedure. Your caregiver may also give you more specific instructions. Your treatment has been planned according to current medical practices, but problems sometimes occur. Call your caregiver if you have any problems or questions after your procedure. HOME CARE INSTRUCTIONS  You may shower the day after the procedure.Remove the bandage (dressing) and gently wash the site with plain soap and water.Gently pat the site dry.  Do not apply powder or lotion to the site.  Do not submerge the affected site in water for 3 to 5 days.  Inspect the site at least twice daily.  Do not flex or bend the affected arm for 24 hours.  No lifting over 5 pounds (2.3 kg) for 5 days after your procedure.  Do not drive home if you are discharged the same day of the procedure. Have someone else drive you.  You may drive 24 hours after the procedure unless otherwise instructed by your caregiver.  Do not operate machinery or power tools for 24 hours.  A responsible adult should be with you for the first 24 hours after you arrive home. What to expect:  Any bruising will usually fade within 1 to 2 weeks.  Blood that collects in the tissue (hematoma) may be painful to the touch. It should usually decrease in size and tenderness within 1 to 2 weeks. SEEK IMMEDIATE MEDICAL CARE IF:  You have unusual pain at the radial site.  You have redness, warmth, swelling, or pain at the radial site.  You have drainage (other than a small amount of blood on the dressing).  You have chills.  You have a fever or persistent symptoms for more than 72 hours.  You have a fever and your symptoms suddenly get worse.  Your arm becomes pale, cool, tingly, or numb.  You have heavy bleeding from the site. Hold pressure on the site. Document Released: 02/03/2010 Document Revised:  03/26/2011 Document Reviewed: 02/03/2010 [facility] Patient Information 2015 [place], [place]. This information is not intended to replace advice given to you by your health care provider. Make sure you discuss any questions you have with your health care provider. Rivaroxaban oral tablets What is this medicine? RIVAROXABAN (ri va ROX a ban) is an anticoagulant (blood thinner). It is used to treat blood clots in the lungs or in the veins. It is also used after knee or hip surgeries to prevent blood clots. It is also used to lower the chance of stroke in people with a medical condition called atrial fibrillation. This medicine may be used for other purposes; ask your health care provider or pharmacist if you have questions. COMMON BRAND NAME(S): Xarelto, Xarelto Starter Pack What should I tell my health care provider before I take this medicine? They need to know if you have any of these conditions: -bleeding disorders -bleeding in the brain -blood in your stools (black or tarry stools) or if you have blood in your vomit -history of stomach bleeding -kidney disease -liver disease -low blood counts, like low white cell, platelet, or red cell counts -recent or planned spinal or epidural procedure -take medicines that treat or prevent blood clots -an unusual or allergic reaction to rivaroxaban, other medicines, foods, dyes, or preservatives -pregnant or trying to get pregnant -breast-feeding How should I use this medicine? Take this medicine by mouth with a glass of water. Follow the directions on the prescription  label. Take your medicine at regular intervals. Do not take it more often than directed. Do not stop taking except on your doctor's advice. Stopping this medicine may increase your risk of a blot clot. Be sure to refill your prescription before you run out of medicine. If you are taking this medicine after hip or knee replacement surgery, take it with or without food. If you are taking this  medicine for atrial fibrillation, take it with your evening meal. If you are taking this medicine to treat blood clots, take it with food at the same time each day. If you are unable to swallow your tablet, you may crush the tablet and mix it in applesauce. Then, immediately eat the applesauce. You should eat more food right after you eat the applesauce containing the crushed tablet. Talk to your pediatrician regarding the use of this medicine in children. Special care may be needed. Overdosage: If you think you have taken too much of this medicine contact a poison control center or emergency room at once. NOTE: This medicine is only for you. Do not share this medicine with others. What if I miss a dose? If you take your medicine once a day and miss a dose, take the missed dose as soon as you remember. If you take your medicine twice a day and miss a dose, take the missed dose immediately. In this instance, 2 tablets may be taken at the same time. The next day you should take 1 tablet twice a day as directed. What may interact with this medicine? -aspirin and aspirin-like medicines -certain antibiotics like erythromycin, azithromycin, and clarithromycin -certain medicines for fungal infections like ketoconazole and itraconazole -certain medicines for irregular heart beat like amiodarone, quinidine, dronedarone -certain medicines for seizures like carbamazepine, phenytoin -certain medicines that treat or prevent blood clots like warfarin, enoxaparin, and dalteparin -conivaptan -diltiazem -felodipine -indinavir -lopinavir; ritonavir -NSAIDS, medicines for pain and inflammation, like ibuprofen or naproxen -ranolazine -rifampin -ritonavir -St. John's wort -verapamil This list may not describe all possible interactions. Give your health care provider a list of all the medicines, herbs, non-prescription drugs, or dietary supplements you use. Also tell them if you smoke, drink alcohol, or use  illegal drugs. Some items may interact with your medicine. What should I watch for while using this medicine? Visit your doctor or health care professional for regular checks on your progress. Your condition will be monitored carefully while you are receiving this medicine. Notify your doctor or health care professional and seek emergency treatment if you develop breathing problems; changes in vision; chest pain; severe, sudden headache; pain, swelling, warmth in the leg; trouble speaking; sudden numbness or weakness of the face, arm, or leg. These can be signs that your condition has gotten worse. If you are going to have surgery, tell your doctor or health care professional that you are taking this medicine. Tell your health care professional that you use this medicine before you have a spinal or epidural procedure. Sometimes people who take this medicine have bleeding problems around the spine when they have a spinal or epidural procedure. This bleeding is very rare. If you have a spinal or epidural procedure while on this medicine, call your health care professional immediately if you have back pain, numbness or tingling (especially in your legs and feet), muscle weakness, paralysis, or loss of bladder or bowel control. Avoid sports and activities that might cause injury while you are using this medicine. Severe falls or injuries can cause unseen bleeding.  Be careful when using sharp tools or knives. Consider using an Copy. Take special care brushing or flossing your teeth. Report any injuries, bruising, or red spots on the skin to your doctor or health care professional. What side effects may I notice from receiving this medicine? Side effects that you should report to your doctor or health care professional as soon as possible: -allergic reactions like skin rash, itching or hives, swelling of the face, lips, or tongue -back pain -redness, blistering, peeling or loosening of the skin,  including inside the mouth -signs and symptoms of bleeding such as bloody or black, tarry stools; red or dark-brown urine; spitting up blood or brown material that looks like coffee grounds; red spots on the skin; unusual bruising or bleeding from the eye, gums, or nose Side effects that usually do not require medical attention (Report these to your doctor or health care professional if they continue or are bothersome.): -dizziness -muscle pain This list may not describe all possible side effects. Call your doctor for medical advice about side effects. You may report side effects to FDA at 1-800-FDA-1088. Where should I keep my medicine? Keep out of the reach of children. Store at room temperature between 15 and 30 degrees C (59 and 86 degrees F). Throw away any unused medicine after the expiration date. NOTE: This sheet is a summary. It may not cover all possible information. If you have questions about this medicine, talk to your doctor, pharmacist, or health care provider.  2015, Elsevier/Gold Standard. (2013-04-23 18:47:48)

## 2014-04-05 NOTE — Interval H&P Note (Signed)
History and Physical Interval Note:  04/05/2014 9:19 AM  [name]  has presented today for surgery, with the diagnosis of pvd  The various methods of treatment have been discussed with the patient and family. After consideration of risks, benefits and other options for treatment, the patient has consented to  Procedure(s): LOWER EXTREMITY ANGIOGRAM (N/A) as a surgical intervention .  The patient's history has been reviewed, patient examined, no change in status, stable for surgery.  I have reviewed the patient's chart and labs.  Questions were answered to the patient's satisfaction.     Lorretta Harp

## 2014-04-06 ENCOUNTER — Ambulatory Visit: Payer: Medicaid Other | Admitting: Cardiovascular Disease

## 2014-04-06 ENCOUNTER — Telehealth: Payer: Self-pay | Admitting: Cardiovascular Disease

## 2014-04-06 ENCOUNTER — Other Ambulatory Visit: Payer: Self-pay | Admitting: *Deleted

## 2014-04-06 MED ORDER — RIVAROXABAN 20 MG PO TABS: 20.0000 mg | ORAL_TABLET | Freq: Every day | ORAL | Status: DC

## 2014-04-06 NOTE — Telephone Encounter (Signed)
Pt said she was suppose to have a stent yesterday,but Dr Gwenlyn Found could not do it. He said because of this ,he was going to put her on a blood thinner.If this is what he wants,she need her prescription called to [facility]

## 2014-04-06 NOTE — Telephone Encounter (Signed)
Advised pt rx was sent to [facility] location yesterday - resent to [facility] in [place] today. She voiced understanding. Advised to contact if any q's on starting med.  Pt voiced understanding.

## 2014-04-07 ENCOUNTER — Telehealth: Payer: Self-pay | Admitting: Vascular Surgery

## 2014-04-07 NOTE — Telephone Encounter (Addendum)
-----   Message from Angelia Mould, MD sent at 04/06/2014  7:26 AM EDT ----- Regarding: Office visit Dr. Gwenlyn Found would like me to see this pt in the office next Wednesday. (04/14/14) Thanks CD   04/07/14: Left message for [facility] to call regarding appointment.

## 2014-04-13 ENCOUNTER — Encounter: Payer: Self-pay | Admitting: Family

## 2014-04-14 ENCOUNTER — Ambulatory Visit (INDEPENDENT_AMBULATORY_CARE_PROVIDER_SITE_OTHER): Payer: Medicare Other | Admitting: Vascular Surgery

## 2014-04-14 ENCOUNTER — Encounter: Payer: Self-pay | Admitting: Vascular Surgery

## 2014-04-14 VITALS — BP 140/66 | HR 70 | Temp 98.0°F | Resp 20 | Ht 63.0 in | Wt 217.0 lb

## 2014-04-14 DIAGNOSIS — I700 Atherosclerosis of aorta: Secondary | ICD-10-CM | POA: Diagnosis not present

## 2014-04-14 DIAGNOSIS — I7409 Other arterial embolism and thrombosis of abdominal aorta: Secondary | ICD-10-CM

## 2014-04-14 NOTE — Progress Notes (Signed)
[facility]  Patient name: [name] MRN: [id number] DOB: Jun 19, 1940 Sex: female  REASON FOR VISIT: Follow up of aortoiliac occlusive disease. Referred by Dr. Quay Burow.  HPI: [name] is a 74 y.o. female who I last saw on 07/30/2012. She underwent a right to left femorofemoral bypass graft in 2012. She previously had a right common iliac artery stent on the inflow side. When I saw her back in 2014 I felt that her right to left femorofemoral graft was occluded but that this had occluded silently. I encouraged her to stay as active as possible. Fortunately she was not a smoker at that time. Her ABI was 85% on the right and 47% on the left.  On 03/12/2014 she developed the acute onset of right leg pain. She had a CT scan on 03/31/2014 which showed an occluded aorta and occluded right common iliac artery stent in addition to her known occlusion of the left common iliac artery. On 04/05/2014 she had an arteriogram via a right radial artery approach which confirmed the findings on CT scan. It showed that her aorta and both common iliac arteries were occluded. In addition she was diagnosed with atrial fibrillation and the plan was to start her on a normal oral anticoagulant prior to discharge. She was also to be scheduled for a Myoview stress test in order to risk stratify her for possible aortobifemoral bypass grafting.  She complains of claudication in both calves and a very short distance but more significantly on the right side. She denies any history of rest pain. She denies any history of nonhealing ulcers.  Unfortunately, because of the stress recently she has started smoking cigarettes again.  Past Medical History  Diagnosis Date  . Hypertension   . PAD (peripheral artery disease)   . Hypothyroidism   . Hypercholesteremia   . Obesity   . Shortness of breath   . Hyperthyroidism   . GERD (gastroesophageal reflux disease)   . Arthritis   .  Diabetes mellitus   . Chronic obstructive asthma 05/28/2011  . COPD (chronic obstructive pulmonary disease)   . Claudication     LEA DOPPLER, 12/10/2011 - FEMORAL-FEMORAL BYPASS GRAFT-occluded; LEFT TIBIAL-appeared occluded;   . DOE (dyspnea on exertion)     NUCLEAR STRESS TEST, 08/03/2010 - EKG negative for ischemia  . SOB (shortness of breath)     2D ECHO, 03/01/2011 - EF 65-70%, moderate LVH,    Family History  Problem Relation Age of Onset  . Diabetes Mother   . Heart disease Mother   . Hyperlipidemia Mother   . Cancer Sister   . Allergies Daughter     x3   SOCIAL HISTORY: History  Substance Use Topics  . Smoking status: Former Smoker -- 2.00 packs/day for 47 years    Types: Cigarettes    Quit date: 01/17/2010  . Smokeless tobacco: Never Used  . Alcohol Use: No   Allergies  Allergen Reactions  . Glucophage [Metformin Hydrochloride] Itching and Rash    REPORTED BY PATIENT; STATES SYMPTOMS APPEARED AFTER THIRD DOES WITHIN THE LAST MONTH.  Marland Kitchen Alendronate Sodium Other (See Comments)    Unknown   . Avelox [Moxifloxacin Hcl In Nacl]     Itching,rash  . Latex     rash  . Metformin And Related   . Omnipaque [Iohexol] Other (See Comments)    Unknown   . Valsartan   . Adhesive [Tape] Rash  . Amoxicillin Itching and Rash  .  Cephalexin Itching and Rash  . Contrast Media [Iodinated Diagnostic Agents] Itching and Rash  . Plavix [Clopidogrel Bisulfate] Itching and Rash   Current Outpatient Prescriptions  Medication Sig Dispense Refill  . amLODipine (NORVASC) 5 MG tablet Take 5 mg by mouth daily.    Marland Kitchen aspirin 81 MG chewable tablet Chew 81 mg by mouth daily.     . Calcium-Magnesium-Vitamin D (CALCIUM MAGNESIUM PO) Take by mouth 2 (two) times daily.      . Cholecalciferol (VITAMIN D) 2000 UNITS tablet Take 2,000 Units by mouth daily.     Marland Kitchen glimepiride (AMARYL) 2 MG tablet Take 2 mg by mouth daily before breakfast.    . hydrOXYzine (ATARAX/VISTARIL) 25 MG tablet Take 1 tablet  (25 mg total) by mouth every 6 (six) hours as needed for anxiety or itching. 30 tablet 0  . levothyroxine (SYNTHROID, LEVOTHROID) 88 MCG tablet Take 1 tablet by mouth daily before breakfast.     . omeprazole (PRILOSEC) 40 MG capsule Take 40 mg by mouth daily.      Marland Kitchen PROAIR HFA 108 (90 BASE) MCG/ACT inhaler INHALE 2 PUFFS INTO THE LUNGS EVERY 6 HOURS AS NEEDED FOR WHEEZING 8.5 g 2  . rivaroxaban (XARELTO) 20 MG TABS tablet Take 1 tablet (20 mg total) by mouth daily with supper. 30 tablet 11  . SYMBICORT 160-4.5 MCG/ACT inhaler USE 2 PUFFS BY MOUTH TWICE DAILY 10.2 g 2  . Travoprost, BAK Free, (TRAVATAN) 0.004 % SOLN ophthalmic solution Place 1 drop into both eyes at bedtime.     Marland Kitchen acetaminophen (TYLENOL) 325 MG tablet Take 2 tablets (650 mg total) by mouth every 4 (four) hours as needed for headache or mild pain. (Patient not taking: Reported on 04/14/2014)    . HYDROcodone-acetaminophen (NORCO/VICODIN) 5-325 MG per tablet Take 1-2 tablets by mouth every 4 (four) hours as needed. (Patient not taking: Reported on 04/14/2014) 20 tablet 0   No current facility-administered medications for this visit.   REVIEW OF SYSTEMS: Valu.Nieves ] denotes positive finding; [  ] denotes negative finding  CARDIOVASCULAR:  [ ]  chest pain   [ ]  chest pressure   [ ]  palpitations   [ ]  orthopnea   Valu.Nieves ] dyspnea on exertion   Valu.Nieves ] claudication   [ ]  rest pain   [ ]  DVT   [ ]  phlebitis PULMONARY:   [ ]  productive cough   Valu.Nieves ] asthma   Valu.Nieves ] wheezing NEUROLOGIC:   [ ]  weakness  [ ]  paresthesias  [ ]  aphasia  [ ]  amaurosis  [ ]  dizziness HEMATOLOGIC:   [ ]  bleeding problems   [ ]  clotting disorders MUSCULOSKELETAL:  [ ]  joint pain   [ ]  joint swelling [ ]  leg swelling GASTROINTESTINAL: [ ]   blood in stool  [ ]   hematemesis GENITOURINARY:  [ ]   dysuria  [ ]   hematuria PSYCHIATRIC:  [ ]  history of major depression INTEGUMENTARY:  [ ]  rashes  [ ]  ulcers CONSTITUTIONAL:  [ ]  fever   [ ]  chills  PHYSICAL EXAM: Filed Vitals:    04/14/14 1617  BP: 140/66  Pulse: 70  Temp: 98 F (36.7 C)  TempSrc: Oral  Resp: 20  Height: 5\' 3"  (1.6 m)  Weight: 217 lb (98.431 kg)  SpO2: 98%   Body mass index is 38.45 kg/(m^2).   GENERAL: The patient is a well-nourished female, in no acute distress. The vital signs are documented above. CARDIOVASCULAR: There is a regular rate and rhythm. I  do not detect carotid bruits. I cannot palpate femoral, popliteal, or pedal pulses. She has mild bilateral lower extremity swelling. She has monophasic Doppler signals in both feet with fairly brisk posterior tibial signals bilaterally with the Doppler. PULMONARY: There is good air exchange bilaterally without wheezing or rales. ABDOMEN: Soft and non-tender with normal pitched bowel sounds.  MUSCULOSKELETAL: There are no major deformities or cyanosis. NEUROLOGIC: No focal weakness or paresthesias are detected. SKIN: she has some mild bluish discoloration of the toes on the right foot. There are no open wounds. PSYCHIATRIC: The patient has a normal affect.  DATA:  I have reviewed her CT scan and arteriogram. Both studies show occlusion of the distal aorta and bilateral common iliac arteries. The clot within the aorta extends right up to the level of the right renal artery. The channel below the renal arteries is being maintained by a patent inferior mesenteric artery. There is reconstitution of the external iliac arteries bilaterally.  MEDICAL ISSUES:  AORTOILIAC OCCLUSIVE DISEASE: The patient has a complicated vascular problem. She has a known left common iliac artery occlusion but had a patent right common iliac artery stent. Her right to left femorofemoral bypass graft has been occluded for some time. This was placed in 2012. Her right common iliac artery likely occluded on 03/12/2014 when she developed the acute onset of right leg pain. However her symptoms have really been limited to claudication and she denies any history of rest pain or  nonhealing ulcers. By my exam, she has fairly brisk posterior tibial signals with the Doppler bilaterally.  I have explained that from my standpoint there were 3 options. One we could continue with conservative treatment including a structured walking program, tobacco cessation, and weight loss with proper nutrition. The surgical options would be axillobifemoral bypass or aortobifemoral bypass. I've explained that axillobifemoral bypass is associated with significantly lower long-term patency. If she required surgery aortobifemoral bypass graft would be the best option I believe.  However, I think that aortobifemoral bypass graft would be associated with significant risk given her obesity, diabetes, COPD, and given that she's had previous groin surgery. She has a very large pannus and I think would be at high-risk for a groin infection.  If a groin infection were complicated by aortic graft infection this would be a complication associated with significant morbidity or mortality. In addition, aortobifemoral bypass grafting would require a suprarenal clamp in order to address the clot which extends right up to the level of the right renal artery.  After an extensive discussion she agrees that she would like to try conservative treatment for now. I plan on seeing her back in 3 months. If her symptoms progressed significantly and she developed disabling rest pain or nonhealing ulcer and certainly we could consider aortofemoral bypass grafting after cardiac evaluation and perhaps even pulmonary evaluation given her COPD and obesity.  Return in about 3 months (around 07/15/2014).   [facility] [place] [facility]: [phone]

## 2014-04-15 ENCOUNTER — Encounter: Payer: Self-pay | Admitting: Vascular Surgery

## 2014-05-10 ENCOUNTER — Telehealth: Payer: Self-pay | Admitting: *Deleted

## 2014-05-10 NOTE — Telephone Encounter (Signed)
PA for Xarelto sent to [facility] via cover my meds.  Patient has nonvalvular Afib

## 2014-05-11 NOTE — Telephone Encounter (Signed)
PA for xarelto 20mg  approved under Medicare Part D until 05/10/2015

## 2014-07-26 ENCOUNTER — Encounter: Payer: Self-pay | Admitting: Vascular Surgery

## 2014-07-26 ENCOUNTER — Other Ambulatory Visit: Payer: Self-pay | Admitting: Vascular Surgery

## 2014-07-26 DIAGNOSIS — I6529 Occlusion and stenosis of unspecified carotid artery: Secondary | ICD-10-CM

## 2014-07-28 ENCOUNTER — Ambulatory Visit: Payer: Self-pay | Admitting: Vascular Surgery

## 2014-07-28 ENCOUNTER — Inpatient Hospital Stay (HOSPITAL_COMMUNITY)
Admission: RE | Admit: 2014-07-28 | Discharge: 2014-07-28 | Disposition: A | Discharge status: Home / Self Care | Payer: Medicare Other | Source: Ambulatory Visit | Attending: Family | Admitting: Family

## 2014-07-28 DIAGNOSIS — I6529 Occlusion and stenosis of unspecified carotid artery: Secondary | ICD-10-CM

## 2014-08-11 ENCOUNTER — Ambulatory Visit: Payer: Medicare Other | Admitting: Family

## 2014-08-11 ENCOUNTER — Other Ambulatory Visit (HOSPITAL_COMMUNITY): Payer: Medicare Other

## 2014-09-08 ENCOUNTER — Ambulatory Visit (HOSPITAL_COMMUNITY)
Admission: RE | Admit: 2014-09-08 | Discharge: 2014-09-08 | Disposition: A | Discharge status: Home / Self Care | Payer: Medicare Other | Source: Ambulatory Visit | Attending: Vascular Surgery | Admitting: Vascular Surgery

## 2014-09-08 ENCOUNTER — Ambulatory Visit: Payer: Medicare Other | Admitting: Vascular Surgery

## 2014-09-08 DIAGNOSIS — I6529 Occlusion and stenosis of unspecified carotid artery: Secondary | ICD-10-CM | POA: Diagnosis present

## 2014-09-08 DIAGNOSIS — I6523 Occlusion and stenosis of bilateral carotid arteries: Secondary | ICD-10-CM | POA: Diagnosis not present

## 2014-09-21 ENCOUNTER — Encounter: Payer: Self-pay | Admitting: Vascular Surgery

## 2014-09-22 ENCOUNTER — Ambulatory Visit (INDEPENDENT_AMBULATORY_CARE_PROVIDER_SITE_OTHER): Payer: Medicare Other | Admitting: Vascular Surgery

## 2014-09-22 ENCOUNTER — Encounter: Payer: Self-pay | Admitting: Vascular Surgery

## 2014-09-22 VITALS — BP 108/65 | HR 100 | Temp 97.7°F | Resp 16 | Ht 62.0 in | Wt 201.0 lb

## 2014-09-22 DIAGNOSIS — I6523 Occlusion and stenosis of bilateral carotid arteries: Secondary | ICD-10-CM

## 2014-09-22 NOTE — Progress Notes (Signed)
    Established Carotid Patient  History of Present Illness  [name] is a 74 y.o. (July 11, 1940) female who presents for continued follow up of her carotid disease.   Patient has no history of TIA or stroke symptom.  The patient has never had amaurosis fugax or monocular blindness.  The patient has never had facial drooping or hemiplegia.  The patient has never had receptive or expressive aphasia.    She continues to have right leg pain extending from her thigh to below the knee that occurs with walking. She is able to walk about half a block before having pain. This pain has been stable without any progression. She denies any rest pain or non-healing wounds. She denies any significant symptoms in her left leg. She has a known left common iliac artery occlusion but patent right common iliac stent. She had a right to left femorofemoral bypass graft that has been chronically occluded. Recommendations were made for conservative treatment or surgical intervention with aortobifemoral bypass or axillobifemoral bypass. Given her significant co morbidities with obesity, diabetes, COPD and previous groin surgery, she would not be a good candidate for aortobifemoral bypass. She elected for conservative treatment.   Since her last visit on 04/14/14, she has lost 45 lbs through diet with salads and vegetables. She has recently "quit smoking again" and has only smoked 2 packs in the last few months.   Physical Examination  Filed Vitals:   09/22/14 1551 09/22/14 1556  BP: 117/77 108/65  Pulse: 100 100  Temp: 97.7 F (36.5 C)   Resp: 16   Height: 5\' 2"  (1.575 m)   Weight: 201 lb (91.173 kg)    Body mass index is 36.75 kg/(m^2).  General: A&O x 3, WD Obese female in NAD  Eyes: PERRLA, EOMI  Neck: Supple   Pulmonary: Sym exp, good air movt, CTAB, no rales, rhonchi, & wheezing  Cardiac: RRR, Nl S1, S2, no Murmurs, rubs or gallops  Vascular: non palpable pedal pulses, but feet are warm and  pink. No ischemic changes.   Musculoskeletal: No muscle wasting or atrophy.   Neurologic: CN 2-12 grossly intact.   Non-Invasive Vascular Imaging  CAROTID DUPLEX (Date: 09/08/14):   R ICA stenosis: 40-59%  R VA:  patent and antegrade  L ICA stenosis: <40%  L VA:  patent and antegrade  Medical Decision Making  Right Carotid Stenosis The patient has been asymptomatic without any TIA or stroke symptoms. On duplex today, she has right ICA stenosis 40-59% and insignificant <40% stenosis on the left. She takes aspirin daily and has quit smoking. Her lipids have been checked but she has not required statin use. Follow up in one year with repeat carotid duplex.   Aortoiliac Occlusive Disease She continues to have stable right hip and lower leg claudication. She denies any nonhealing wounds or rest pain. She would like to continue with conservative treatment. She has been keeping active and adopting a healthier diet. She has recently quit smoking. If her symptoms progress, she would require aortobifemoral bypass which would be associated with significant risk given her comorbidities. Follow up in one year with ABIs.    Virgina Jock, PA-C [facility] Office: [phone] Pager: [phone]  09/22/2014, 4:23 PM  This patient was seen in conjunction with Dr. Scot Dock

## 2014-09-23 ENCOUNTER — Other Ambulatory Visit: Payer: Self-pay | Admitting: Endocrinology

## 2014-09-23 ENCOUNTER — Inpatient Hospital Stay: Admission: RE | Admit: 2014-09-23 | Payer: Medicare Other | Source: Ambulatory Visit

## 2014-09-23 DIAGNOSIS — E049 Nontoxic goiter, unspecified: Secondary | ICD-10-CM

## 2014-11-24 ENCOUNTER — Ambulatory Visit
Admission: RE | Admit: 2014-11-24 | Discharge: 2014-11-24 | Disposition: A | Discharge status: Home / Self Care | Payer: Medicare Other | Source: Ambulatory Visit | Attending: Endocrinology | Admitting: Endocrinology

## 2014-11-24 DIAGNOSIS — E049 Nontoxic goiter, unspecified: Secondary | ICD-10-CM

## 2015-04-07 ENCOUNTER — Ambulatory Visit
Admission: RE | Admit: 2015-04-07 | Discharge: 2015-04-07 | Disposition: A | Discharge status: Home / Self Care | Payer: PRIVATE HEALTH INSURANCE | Source: Ambulatory Visit | Attending: Family | Admitting: Family

## 2015-04-07 ENCOUNTER — Other Ambulatory Visit: Payer: Self-pay | Admitting: Family

## 2015-04-07 DIAGNOSIS — R059 Cough, unspecified: Secondary | ICD-10-CM

## 2015-04-07 DIAGNOSIS — R05 Cough: Secondary | ICD-10-CM

## 2015-04-07 DIAGNOSIS — R062 Wheezing: Secondary | ICD-10-CM

## 2015-04-09 NOTE — ED Notes (Signed)
Per EMS, from home SOB since 2200. Cold for past 3 days. Wheezing hx asthma COPD. Takes symbicort at home. Albuterol Inhaler didn't work, 2 duonebs given w/ 125mg  solumedrol.

## 2015-04-10 ENCOUNTER — Encounter (HOSPITAL_COMMUNITY): Payer: Self-pay | Admitting: *Deleted

## 2015-04-10 ENCOUNTER — Emergency Department (HOSPITAL_COMMUNITY): Payer: Medicare Other

## 2015-04-10 ENCOUNTER — Inpatient Hospital Stay (HOSPITAL_COMMUNITY)
Admission: EM | Admit: 2015-04-10 | Discharge: 2015-04-14 | DRG: 190 | Disposition: A | Discharge status: Home / Self Care | Payer: Medicare Other | Attending: Internal Medicine | Admitting: Internal Medicine

## 2015-04-10 DIAGNOSIS — Z833 Family history of diabetes mellitus: Secondary | ICD-10-CM | POA: Diagnosis not present

## 2015-04-10 DIAGNOSIS — K219 Gastro-esophageal reflux disease without esophagitis: Secondary | ICD-10-CM | POA: Diagnosis present

## 2015-04-10 DIAGNOSIS — Z7982 Long term (current) use of aspirin: Secondary | ICD-10-CM

## 2015-04-10 DIAGNOSIS — I481 Persistent atrial fibrillation: Secondary | ICD-10-CM | POA: Diagnosis present

## 2015-04-10 DIAGNOSIS — J9601 Acute respiratory failure with hypoxia: Secondary | ICD-10-CM

## 2015-04-10 DIAGNOSIS — J441 Chronic obstructive pulmonary disease with (acute) exacerbation: Principal | ICD-10-CM | POA: Diagnosis present

## 2015-04-10 DIAGNOSIS — Z7984 Long term (current) use of oral hypoglycemic drugs: Secondary | ICD-10-CM | POA: Diagnosis not present

## 2015-04-10 DIAGNOSIS — I739 Peripheral vascular disease, unspecified: Secondary | ICD-10-CM | POA: Diagnosis present

## 2015-04-10 DIAGNOSIS — E7800 Pure hypercholesterolemia, unspecified: Secondary | ICD-10-CM | POA: Diagnosis present

## 2015-04-10 DIAGNOSIS — J45909 Unspecified asthma, uncomplicated: Secondary | ICD-10-CM | POA: Diagnosis present

## 2015-04-10 DIAGNOSIS — E039 Hypothyroidism, unspecified: Secondary | ICD-10-CM | POA: Diagnosis present

## 2015-04-10 DIAGNOSIS — E1169 Type 2 diabetes mellitus with other specified complication: Secondary | ICD-10-CM

## 2015-04-10 DIAGNOSIS — Z7951 Long term (current) use of inhaled steroids: Secondary | ICD-10-CM

## 2015-04-10 DIAGNOSIS — Z79891 Long term (current) use of opiate analgesic: Secondary | ICD-10-CM

## 2015-04-10 DIAGNOSIS — I10 Essential (primary) hypertension: Secondary | ICD-10-CM | POA: Diagnosis present

## 2015-04-10 DIAGNOSIS — E038 Other specified hypothyroidism: Secondary | ICD-10-CM | POA: Diagnosis not present

## 2015-04-10 DIAGNOSIS — Z79899 Other long term (current) drug therapy: Secondary | ICD-10-CM

## 2015-04-10 DIAGNOSIS — Z8249 Family history of ischemic heart disease and other diseases of the circulatory system: Secondary | ICD-10-CM | POA: Diagnosis not present

## 2015-04-10 DIAGNOSIS — R000 Tachycardia, unspecified: Secondary | ICD-10-CM | POA: Diagnosis present

## 2015-04-10 DIAGNOSIS — E119 Type 2 diabetes mellitus without complications: Secondary | ICD-10-CM | POA: Diagnosis present

## 2015-04-10 DIAGNOSIS — I480 Paroxysmal atrial fibrillation: Secondary | ICD-10-CM | POA: Diagnosis present

## 2015-04-10 DIAGNOSIS — Z7901 Long term (current) use of anticoagulants: Secondary | ICD-10-CM | POA: Diagnosis not present

## 2015-04-10 DIAGNOSIS — Z6831 Body mass index (BMI) 31.0-31.9, adult: Secondary | ICD-10-CM | POA: Diagnosis not present

## 2015-04-10 DIAGNOSIS — R0602 Shortness of breath: Secondary | ICD-10-CM | POA: Diagnosis present

## 2015-04-10 DIAGNOSIS — I4891 Unspecified atrial fibrillation: Secondary | ICD-10-CM | POA: Diagnosis not present

## 2015-04-10 DIAGNOSIS — M1990 Unspecified osteoarthritis, unspecified site: Secondary | ICD-10-CM | POA: Diagnosis present

## 2015-04-10 DIAGNOSIS — Z87891 Personal history of nicotine dependence: Secondary | ICD-10-CM

## 2015-04-10 DIAGNOSIS — E669 Obesity, unspecified: Secondary | ICD-10-CM | POA: Diagnosis present

## 2015-04-10 LAB — BASIC METABOLIC PANEL
ANION GAP: 10 (ref 5–15)
Anion gap: 10 (ref 5–15)
BUN: 15 mg/dL (ref 6–20)
BUN: 14 mg/dL (ref 6–20)
CALCIUM: 8.7 mg/dL — AB (ref 8.9–10.3)
CHLORIDE: 104 mmol/L (ref 101–111)
CHLORIDE: 104 mmol/L (ref 101–111)
CO2: 22 mmol/L (ref 22–32)
CO2: 23 mmol/L (ref 22–32)
CREATININE: 0.95 mg/dL (ref 0.44–1.00)
Calcium: 8.5 mg/dL — ABNORMAL LOW (ref 8.9–10.3)
Creatinine, Ser: 0.95 mg/dL (ref 0.44–1.00)
GFR calc Af Amer: >60 mL/min (ref >60)
GFR calc Af Amer: >60 mL/min (ref >60)
GFR calc non Af Amer: 58 mL/min — ABNORMAL LOW (ref >60)
GFR calc non Af Amer: 58 mL/min — ABNORMAL LOW (ref >60)
GLUCOSE: 127 mg/dL — AB (ref 65–99)
GLUCOSE: 193 mg/dL — AB (ref 65–99)
POTASSIUM: 4 mmol/L (ref 3.5–5.1)
Potassium: 4.1 mmol/L (ref 3.5–5.1)
Sodium: 136 mmol/L (ref 135–145)
Sodium: 137 mmol/L (ref 135–145)

## 2015-04-10 LAB — BRAIN NATRIURETIC PEPTIDE: B NATRIURETIC PEPTIDE 5: 190.1 pg/mL — AB (ref 0.0–100.0)

## 2015-04-10 LAB — CBC
HCT: 39.9 % (ref 36.0–46.0)
HEMATOCRIT: 38 % (ref 36.0–46.0)
HEMOGLOBIN: 12.6 g/dL (ref 12.0–15.0)
Hemoglobin: 12.8 g/dL (ref 12.0–15.0)
MCH: 25 pg — AB (ref 26.0–34.0)
MCH: 25.1 pg — AB (ref 26.0–34.0)
MCHC: 32.1 g/dL (ref 30.0–36.0)
MCHC: 33.2 g/dL (ref 30.0–36.0)
MCV: 75.7 fL — AB (ref 78.0–100.0)
MCV: 77.9 fL — AB (ref 78.0–100.0)
PLATELETS: 215 10*3/uL (ref 150–400)
Platelets: 255 10*3/uL (ref 150–400)
RBC: 5.02 MIL/uL (ref 3.87–5.11)
RBC: 5.12 MIL/uL — ABNORMAL HIGH (ref 3.87–5.11)
RDW: 18.2 % — ABNORMAL HIGH (ref 11.5–15.5)
RDW: 18.3 % — AB (ref 11.5–15.5)
WBC: 6.2 10*3/uL (ref 4.0–10.5)
WBC: 6.7 10*3/uL (ref 4.0–10.5)

## 2015-04-10 LAB — GLUCOSE, CAPILLARY
GLUCOSE-CAPILLARY: 192 mg/dL — AB (ref 65–99)
GLUCOSE-CAPILLARY: 162 mg/dL — AB (ref 65–99)
Glucose-Capillary: 267 mg/dL — ABNORMAL HIGH (ref 65–99)
Glucose-Capillary: 162 mg/dL — ABNORMAL HIGH (ref 65–99)

## 2015-04-10 LAB — TROPONIN I: Troponin I: <0.03 ng/mL (ref <0.031)

## 2015-04-10 MED ORDER — SODIUM CHLORIDE 0.9 % IV SOLN: 250.0000 mL | INTRAVENOUS | Status: DC | PRN

## 2015-04-10 MED ORDER — PANTOPRAZOLE SODIUM 40 MG PO TBEC
40.0000 mg | DELAYED_RELEASE_TABLET | Freq: Every day | ORAL | Status: DC
  Administered 2015-04-10 – 2015-04-14 (×5): 40 mg via ORAL
  Filled 2015-04-10 (×5): qty 1

## 2015-04-10 MED ORDER — LEVALBUTEROL HCL 1.25 MG/0.5ML IN NEBU
1.2500 mg | INHALATION_SOLUTION | Freq: Once | RESPIRATORY_TRACT | Status: AC
  Administered 2015-04-10: 1.25 mg via RESPIRATORY_TRACT
  Filled 2015-04-10: qty 0.5

## 2015-04-10 MED ORDER — ONDANSETRON HCL 4 MG PO TABS: 4.0000 mg | ORAL_TABLET | Freq: Four times a day (QID) | ORAL | Status: DC | PRN

## 2015-04-10 MED ORDER — SODIUM CHLORIDE 0.9% FLUSH
3.0000 mL | Freq: Two times a day (BID) | INTRAVENOUS | Status: DC
  Administered 2015-04-10 – 2015-04-13 (×4): 3 mL via INTRAVENOUS

## 2015-04-10 MED ORDER — ASPIRIN 81 MG PO CHEW
81.0000 mg | CHEWABLE_TABLET | Freq: Every day | ORAL | Status: DC
Start: 2015-04-10 — End: 2015-04-14
  Administered 2015-04-10 – 2015-04-14 (×5): 81 mg via ORAL
  Filled 2015-04-10 (×5): qty 1

## 2015-04-10 MED ORDER — ALBUTEROL SULFATE (2.5 MG/3ML) 0.083% IN NEBU: 5.0000 mg | INHALATION_SOLUTION | RESPIRATORY_TRACT | Status: AC | PRN

## 2015-04-10 MED ORDER — CARVEDILOL 12.5 MG PO TABS
12.5000 mg | ORAL_TABLET | Freq: Two times a day (BID) | ORAL | Status: DC
  Administered 2015-04-10 – 2015-04-13 (×7): 12.5 mg via ORAL
  Filled 2015-04-10 (×7): qty 1

## 2015-04-10 MED ORDER — METHYLPREDNISOLONE SODIUM SUCC 125 MG IJ SOLR
125.0000 mg | Freq: Once | INTRAMUSCULAR | Status: AC
  Administered 2015-04-10: 125 mg via INTRAVENOUS
  Filled 2015-04-10: qty 2

## 2015-04-10 MED ORDER — INSULIN ASPART 100 UNIT/ML ~~LOC~~ SOLN
0.0000 [IU] | Freq: Three times a day (TID) | SUBCUTANEOUS | Status: DC
Start: 2015-04-10 — End: 2015-04-14
  Administered 2015-04-10: 5 [IU] via SUBCUTANEOUS
  Administered 2015-04-10 (×2): 2 [IU] via SUBCUTANEOUS
  Administered 2015-04-11 (×2): 1 [IU] via SUBCUTANEOUS
  Administered 2015-04-11: 2 [IU] via SUBCUTANEOUS
  Administered 2015-04-12 (×2): 1 [IU] via SUBCUTANEOUS
  Administered 2015-04-13: 2 [IU] via SUBCUTANEOUS
  Administered 2015-04-13 (×2): 1 [IU] via SUBCUTANEOUS

## 2015-04-10 MED ORDER — AMLODIPINE BESYLATE 5 MG PO TABS
5.0000 mg | ORAL_TABLET | Freq: Every day | ORAL | Status: DC
  Administered 2015-04-10 – 2015-04-12 (×3): 5 mg via ORAL
  Filled 2015-04-10 (×3): qty 1

## 2015-04-10 MED ORDER — IPRATROPIUM-ALBUTEROL 0.5-2.5 (3) MG/3ML IN SOLN
3.0000 mL | Freq: Four times a day (QID) | RESPIRATORY_TRACT | Status: DC
  Administered 2015-04-10: 3 mL via RESPIRATORY_TRACT
  Filled 2015-04-10: qty 3

## 2015-04-10 MED ORDER — ONDANSETRON HCL 4 MG/2ML IJ SOLN: 4.0000 mg | Freq: Four times a day (QID) | INTRAMUSCULAR | Status: DC | PRN

## 2015-04-10 MED ORDER — INSULIN ASPART 100 UNIT/ML ~~LOC~~ SOLN: 0.0000 [IU] | Freq: Every day | SUBCUTANEOUS | Status: DC

## 2015-04-10 MED ORDER — IPRATROPIUM-ALBUTEROL 0.5-2.5 (3) MG/3ML IN SOLN
3.0000 mL | Freq: Three times a day (TID) | RESPIRATORY_TRACT | Status: DC
  Administered 2015-04-10 – 2015-04-13 (×8): 3 mL via RESPIRATORY_TRACT
  Filled 2015-04-10 (×8): qty 3

## 2015-04-10 MED ORDER — SODIUM CHLORIDE 0.9% FLUSH
3.0000 mL | Freq: Two times a day (BID) | INTRAVENOUS | Status: DC
  Administered 2015-04-11 – 2015-04-14 (×6): 3 mL via INTRAVENOUS

## 2015-04-10 MED ORDER — ADULT MULTIVITAMIN W/MINERALS CH
1.0000 | ORAL_TABLET | Freq: Every day | ORAL | Status: DC
  Administered 2015-04-10 – 2015-04-14 (×5): 1 via ORAL
  Filled 2015-04-10 (×6): qty 1

## 2015-04-10 MED ORDER — RIVAROXABAN 20 MG PO TABS: 20.0000 mg | ORAL_TABLET | Freq: Every day | ORAL | Status: DC

## 2015-04-10 MED ORDER — LEVOTHYROXINE SODIUM 88 MCG PO TABS
88.0000 ug | ORAL_TABLET | Freq: Every day | ORAL | Status: DC
  Administered 2015-04-10 – 2015-04-14 (×5): 88 ug via ORAL
  Filled 2015-04-10 (×5): qty 1

## 2015-04-10 MED ORDER — LATANOPROST 0.005 % OP SOLN
1.0000 [drp] | Freq: Every day | OPHTHALMIC | Status: DC
  Administered 2015-04-10 – 2015-04-13 (×4): 1 [drp] via OPHTHALMIC
  Filled 2015-04-10: qty 2.5

## 2015-04-10 MED ORDER — LEVALBUTEROL HCL 1.25 MG/0.5ML IN NEBU
1.2500 mg | INHALATION_SOLUTION | Freq: Once | RESPIRATORY_TRACT | Status: AC
Start: 2015-04-10 — End: 2015-04-10
  Administered 2015-04-10: 1.25 mg via RESPIRATORY_TRACT
  Filled 2015-04-10: qty 0.5

## 2015-04-10 MED ORDER — OXYCODONE HCL 5 MG PO TABS
5.0000 mg | ORAL_TABLET | ORAL | Status: DC | PRN
Start: 2015-04-10 — End: 2015-04-14

## 2015-04-10 MED ORDER — HYDROMORPHONE HCL 1 MG/ML IJ SOLN: 0.5000 mg | INTRAMUSCULAR | Status: DC | PRN

## 2015-04-10 MED ORDER — MAGNESIUM SULFATE 2 GM/50ML IV SOLN
2.0000 g | Freq: Once | INTRAVENOUS | Status: AC
  Administered 2015-04-10: 2 g via INTRAVENOUS
  Filled 2015-04-10: qty 50

## 2015-04-10 MED ORDER — HYDROXYZINE HCL 25 MG PO TABS: 25.0000 mg | ORAL_TABLET | Freq: Four times a day (QID) | ORAL | Status: DC | PRN

## 2015-04-10 MED ORDER — BUPROPION HCL ER (SR) 150 MG PO TB12
150.0000 mg | ORAL_TABLET | Freq: Two times a day (BID) | ORAL | Status: DC
  Administered 2015-04-10 – 2015-04-14 (×9): 150 mg via ORAL
  Filled 2015-04-10 (×10): qty 1

## 2015-04-10 MED ORDER — ALBUTEROL SULFATE (2.5 MG/3ML) 0.083% IN NEBU: 2.5000 mg | INHALATION_SOLUTION | RESPIRATORY_TRACT | Status: DC | PRN

## 2015-04-10 MED ORDER — SODIUM CHLORIDE 0.9% FLUSH: 3.0000 mL | INTRAVENOUS | Status: DC | PRN

## 2015-04-10 MED ORDER — CETYLPYRIDINIUM CHLORIDE 0.05 % MT LIQD
7.0000 mL | Freq: Two times a day (BID) | OROMUCOSAL | Status: DC
  Administered 2015-04-10 – 2015-04-14 (×9): 7 mL via OROMUCOSAL

## 2015-04-10 MED ORDER — ACETAMINOPHEN 325 MG PO TABS: 650.0000 mg | ORAL_TABLET | Freq: Four times a day (QID) | ORAL | Status: DC | PRN

## 2015-04-10 MED ORDER — ACETAMINOPHEN 650 MG RE SUPP: 650.0000 mg | Freq: Four times a day (QID) | RECTAL | Status: DC | PRN

## 2015-04-10 MED ORDER — METHYLPREDNISOLONE SODIUM SUCC 125 MG IJ SOLR
60.0000 mg | Freq: Two times a day (BID) | INTRAMUSCULAR | Status: DC
  Administered 2015-04-10: 60 mg via INTRAVENOUS
  Filled 2015-04-10: qty 2

## 2015-04-10 NOTE — ED Notes (Addendum)
Per EMS, from home SOB, wheezing since 2200. Cold for past 3 days. Takes symbicort at home. Albuterol Inhaler didn't work, 2 duonebs given w/ 125mg  solumedrol. hx of asthma, COPD

## 2015-04-10 NOTE — ED Provider Notes (Signed)
CSN: [id number]     Arrival date & time 04/10/15  0000 History  By signing my name below, I, Irene Pap, attest that this documentation has been prepared under the direction and in the presence of Rolland Porter, MD at [place]. Electronically Signed: Irene Pap, ED Scribe. 04/10/2015. 1:48 AM.   Chief Complaint  Patient presents with  . Shortness of Breath   The history is provided by the patient. No language interpreter was used.  HPI Comments: [name] is a 75 y.o. female with a hx of HTN, A-Fib, PAD, SOB, COPD, DOE, chronic obstructive asthma, and DM brought in by EMS who presents to the Emergency Department complaining of SOB and wheezing onset 3 hours ago starting around 9:30 pm tonight. She reports associated mild productive cough with whitish sputum. She states that her tongue also feels dry. Pt used her inhaler and nebulizer treatments to mild relief, but the SOB returned. Pt smokes ~1ppd. Pt lives alone and denies use of oxygen at home. She has not seen a cardiologist for her A-Fib, but states she was told she had an irregular heartbeat when she was a teenager. Pt denies use of blood thinners, chest pain, rhinorrhea, sore throat, diaphoresis, or nausea and vomiting.   PCP: Maximino Greenland, MD  Past Medical History  Diagnosis Date  . Hypertension   . PAD (peripheral artery disease)   . Hypothyroidism   . Hypercholesteremia   . Obesity   . Shortness of breath   . Hyperthyroidism   . GERD (gastroesophageal reflux disease)   . Arthritis   . Diabetes mellitus   . Chronic obstructive asthma 05/28/2011  . COPD (chronic obstructive pulmonary disease)   . Claudication     LEA DOPPLER, 12/10/2011 - FEMORAL-FEMORAL BYPASS GRAFT-occluded; LEFT TIBIAL-appeared occluded;   . DOE (dyspnea on exertion)     NUCLEAR STRESS TEST, 08/03/2010 - EKG negative for ischemia  . SOB (shortness of breath)     2D ECHO, 03/01/2011 - EF 65-70%, moderate LVH,    Past Surgical History  Procedure  Laterality Date  . Femoral-femoral bypass graft  05/09/2010    Right to Left Fem-Fem BPG by Dr. Scot Dock  . Tubal ligation    . Descending aortic aneurysm repair w/ stent      Stent repair  . Percutaneous stent intervention  12/12/2010    Rt common iliac stenosis stented with a 8x72mm ICAST covered stent resulting in a reduction of a 50% in-stent restenosis to 0%  . Percutaneous stent intervention  04/20/2010    Rt common iliac stenosis stented with a 8x18 Genesis OPTA stent balloon resulting in reduciton of a 70% stenosis to 0% residual  . Percutaneous stent intervention  10/18/2008    No intervention  . Percutaneous stent intervention  09/14/2008    Rt common iliac stenosis stented with a 4x10 Cordis stent resulting in a reduction of 99% stenosis to less than 20% residual  . Cardiac catheterization  10/18/2008    No intervention  . Cardiac catheterization  09/14/2008    Continue medical therapy  . Abdominal angiogram Left 12/12/2010    Procedure: ABDOMINAL ANGIOGRAM;  Surgeon: Lorretta Harp, MD;  Location: [facility] CATH LAB;  Service: Cardiovascular;  Laterality: Left;  . Percutaneous stent intervention Right 12/12/2010    Procedure: PERCUTANEOUS STENT INTERVENTION;  Surgeon: Lorretta Harp, MD;  Location[facility] CATH LAB;  Service: Cardiovascular;  Laterality: Right;  . Lower extremity angiogram N/A 04/05/2014    Procedure: LOWER EXTREMITY ANGIOGRAM;  Surgeon: Lorretta Harp, MD;  Location: [facility] CATH LAB;  Service: Cardiovascular;  Laterality: N/A;   Family History  Problem Relation Age of Onset  . Diabetes Mother   . Heart disease Mother   . Hyperlipidemia Mother   . Cancer Sister   . Allergies Daughter     x3   Social History  Substance Use Topics  . Smoking status: Former Smoker -- 2.00 packs/day for 47 years    Types: Cigarettes    Quit date: 01/17/2010  . Smokeless tobacco: Never Used  . Alcohol Use: No  lives alone Smokes about 1 ppd  OB History    No data available      Review of Systems  Constitutional: Negative for diaphoresis.  HENT: Negative for rhinorrhea and sore throat.   Respiratory: Positive for cough, shortness of breath and wheezing.   Cardiovascular: Negative for chest pain.  Gastrointestinal: Negative for nausea.  All other systems reviewed and are negative.  Allergies  Glucophage; Alendronate sodium; Avelox; Latex; Metformin and related; Omnipaque; Valsartan; Adhesive; Amoxicillin; Cephalexin; Contrast media; and Plavix  Home Medications   Prior to Admission medications   Medication Sig Start Date End Date Taking? Authorizing Provider  acetaminophen (TYLENOL) 325 MG tablet Take 2 tablets (650 mg total) by mouth every 4 (four) hours as needed for headache or mild pain. 04/05/14   Erlene Quan, PA-C  amLODipine (NORVASC) 5 MG tablet Take 5 mg by mouth daily.    Historical Provider, MD  aspirin 81 MG chewable tablet Chew 81 mg by mouth daily.     Historical Provider, MD  glimepiride (AMARYL) 2 MG tablet Take 2 mg by mouth daily before breakfast.    Historical Provider, MD  HYDROcodone-acetaminophen (NORCO/VICODIN) 5-325 MG per tablet Take 1-2 tablets by mouth every 4 (four) hours as needed. 03/21/14   Lacretia Leigh, MD  hydrOXYzine (ATARAX/VISTARIL) 25 MG tablet Take 1 tablet (25 mg total) by mouth every 6 (six) hours as needed for anxiety or itching. 04/05/14   Erlene Quan, PA-C  levothyroxine (SYNTHROID, LEVOTHROID) 88 MCG tablet Take 1 tablet by mouth daily before breakfast.  07/10/12   Historical Provider, MD  Multiple Vitamins-Minerals (MULTIVITAMIN WITH MINERALS) tablet Take 1 tablet by mouth daily.    Historical Provider, MD  omeprazole (PRILOSEC) 40 MG capsule Take 40 mg by mouth daily.      Historical Provider, MD  PROAIR HFA 108 (90 BASE) MCG/ACT inhaler INHALE 2 PUFFS INTO THE LUNGS EVERY 6 HOURS AS NEEDED FOR WHEEZING 02/18/14   Chesley Mires, MD  rivaroxaban (XARELTO) 20 MG TABS tablet Take 1 tablet (20 mg total) by mouth daily with  supper. 04/06/14   Lorretta Harp, MD  SYMBICORT 160-4.5 MCG/ACT inhaler USE 2 PUFFS BY MOUTH TWICE DAILY 04/01/14   Chesley Mires, MD  Travoprost, BAK Free, (TRAVATAN) 0.004 % SOLN ophthalmic solution Place 1 drop into both eyes at bedtime.     Historical Provider, MD   BP 108/69 mmHg  Pulse 94  Temp(Src) 98.2 F (36.8 C) (Oral)  Resp 22  SpO2 94%  Vital signs normal   Physical Exam  Constitutional: She is oriented to person, place, and time. She appears well-developed and well-nourished.  Non-toxic appearance. She does not appear ill. No distress.  HENT:  Head: Normocephalic and atraumatic.  Right Ear: External ear normal.  Left Ear: External ear normal.  Nose: Nose normal. No mucosal edema or rhinorrhea.  Mouth/Throat: Oropharynx is clear and moist and mucous membranes are  normal. No dental abscesses or uvula swelling.  Eyes: Conjunctivae and EOM are normal. Pupils are equal, round, and reactive to light.  Neck: Normal range of motion and full passive range of motion without pain. Neck supple.  Cardiovascular: Normal rate and normal heart sounds.  An irregularly irregular rhythm present. Exam reveals no gallop and no friction rub.   No murmur heard. Pulmonary/Chest: Effort normal. No respiratory distress. She has wheezes. She has no rhonchi. She has no rales. She exhibits no tenderness and no crepitus.  Diffuse wheezing  Abdominal: Soft. Normal appearance and bowel sounds are normal. She exhibits no distension. There is no tenderness. There is no rebound and no guarding.  Musculoskeletal: Normal range of motion. She exhibits no edema or tenderness.  Moves all extremities well.   Neurological: She is alert and oriented to person, place, and time. She has normal strength. No cranial nerve deficit.  Skin: Skin is warm, dry and intact. No rash noted. No erythema. No pallor.  Psychiatric: She has a normal mood and affect. Her speech is normal and behavior is normal. Her mood appears not  anxious.  Nursing note and vitals reviewed.   ED Course  Procedures (including critical care time)  Medications  magnesium sulfate IVPB 2 g 50 mL (not administered)  levalbuterol (XOPENEX) nebulizer solution 1.25 mg (1.25 mg Nebulization Given 04/10/15 0044)  methylPREDNISolone sodium succinate (SOLU-MEDROL) 125 mg/2 mL injection 125 mg (125 mg Intravenous Given 04/10/15 0045)  levalbuterol (XOPENEX) nebulizer solution 1.25 mg (1.25 mg Nebulization Given 04/10/15 0207)  levalbuterol (XOPENEX) nebulizer solution 1.25 mg (1.25 mg Nebulization Given 04/10/15 0331)    DIAGNOSTIC STUDIES: Oxygen Saturation is 99% on RA, normal by my interpretation.    COORDINATION OF CARE: 12:34 AM-Discussed treatment plan which includes chest x-ray and breathing treatments with pt at bedside and pt agreed to plan. Patient has a history of atrial fibrillation and at the time of my exam her heart rate was 105. Because of that she was given Xopenex instead of albuterol.  1:17 AM- rechecked with pt following the first breathing treatment;: improved air movement with diffuse wheezing and rhonchi that is new  2:32 AM- rechecked pt after her second nebulizer treatment with Xopenex. Patient states her breathing has improved. Diffuse rhonchi present. Will give another breathing treatment.  3:36 AM-rechecked pt who is on her third breathing treatment. Patient states she's feeling better. Rhonchi has resolved but diffuse wheezing has returned. Discussed that she is still wheezing after 3 nebulizer treatments and she should be admitted. Patient is agreeable.  04:23 Dr Gasper Lloyd, hospitalist, admit to tele, requests IV magnesium.   Labs Review Results for orders placed or performed during the hospital encounter of 04/10/15  CBC  Result Value Ref Range   WBC 6.7 4.0 - 10.5 K/uL   RBC 5.02 3.87 - 5.11 MIL/uL   Hemoglobin 12.6 12.0 - 15.0 g/dL   HCT 38.0 36.0 - 46.0 %   MCV 75.7 (L) 78.0 - 100.0 fL   MCH 25.1 (L) 26.0  - 34.0 pg   MCHC 33.2 30.0 - 36.0 g/dL   RDW 18.2 (H) 11.5 - 15.5 %   Platelets 255 150 - 400 K/uL  Brain natriuretic peptide  Result Value Ref Range   B Natriuretic Peptide 190.1 (H) 0.0 - 100.0 pg/mL  Troponin I  Result Value Ref Range   Troponin I <0.03 <0.031 ng/mL  Basic metabolic panel  Result Value Ref Range   Sodium 136 135 - 145 mmol/L  Potassium 4.0 3.5 - 5.1 mmol/L   Chloride 104 101 - 111 mmol/L   CO2 22 22 - 32 mmol/L   Glucose, Bld 127 (H) 65 - 99 mg/dL   BUN 15 6 - 20 mg/dL   Creatinine, Ser 0.95 0.44 - 1.00 mg/dL   Calcium 8.5 (L) 8.9 - 10.3 mg/dL   GFR calc non Af Amer 58 (L) >60 mL/min   GFR calc Af Amer >60 >60 mL/min   Anion gap 10 5 - 15    Laboratory interpretation all normal except Minimally elevated BNP    Imaging Review Dg Chest 2 View  04/10/2015  CLINICAL DATA:  Increasing shortness of breath since 20/2 100 hours tonight. EXAM: CHEST  2 VIEW COMPARISON:  04/07/2015 FINDINGS: The heart size and mediastinal contours are within normal limits. Both lungs are clear. The visualized skeletal structures are unremarkable. IMPRESSION: No active cardiopulmonary disease. Electronically Signed   By: Lucienne Capers M.D.   On: 04/10/2015 00:35   I have personally reviewed and evaluated these images and lab results as part of my medical decision-making.   EKG Interpretation   Date/Time:  Sunday April 10 2015 00:14:05 EDT Ventricular Rate:  108 PR Interval:    QRS Duration: 75 QT Interval:  331 QTC Calculation: 444 R Axis:   56 Text Interpretation:  Atrial fibrillation Ventricular premature complex  Borderline repolarization abnormality Since last tracing rate faster 05 Apr 2014 Confirmed by Insiya Oshea  MD-I, Tranesha Lessner ([id number]) on 04/10/2015 12:28:19 AM      MDM   Final diagnoses:  COPD with exacerbation ([place])   Plan admission  Rolland Porter, MD, FACEP   I personally performed the services described in this documentation, which was scribed in my presence. The  recorded information has been reviewed and considered.  Rolland Porter, MD, Barbette Or, MD 04/10/15 [phone]

## 2015-04-10 NOTE — ED Notes (Signed)
Daughter Lorie's # [phone]

## 2015-04-10 NOTE — Progress Notes (Signed)
Patient seen and examined by me.  Agree with plan per Dr. Arnoldo Morale: COPD exacerbation ([place]) Duonebs Steroid taper IV Magnesium x 1 O2 PRN- was NOT ON O2 prior to admission Monitor O2 sats   PAF (paroxysmal atrial fibrillation) (HCC) On Xarelto Rx    coreg   Diabetes mellitus (HCC) SSI coverage PRN Check HbA1C in AM    ? IF on amaryl?   Essential hypertension continue Amlodipine Rx Monitor BPs    Hypothyroidism Continue Levothyroxine Rx    Brittney Bear DO

## 2015-04-10 NOTE — H&P (Signed)
[facility] Admission History and Physical       [name] [id number] DOB: 11/17/1940 DOA: 04/10/2015  Referring physician: EDP PCP: Maximino Greenland, MD  Specialists:   Chief Complaint: SOB and Wheezing  HPI: [name] is a 75 y.o. female with a history of COPD, Atrial fibrillation, HTN, DM2 who presents to the ED with complaints of SOB and Wheezing that started 3 hours PTA.   She was found to have tachycardia and tachypnea and was administered IV Solumedrol and Xopenex Nebulizer treatments but continued to have Wheezing.   She was administered IV Magnesium x 1 and referred for admission for a COPD exacerbation.   Her Chest X-Ray was negative for acute findings.      Review of Systems:  Constitutional: No Weight Loss, No Weight Gain, Night Sweats, Fevers, Chills, Dizziness, Light Headedness, Fatigue, or Generalized Weakness HEENT: No Headaches, Difficulty Swallowing,Tooth/Dental Problems,Sore Throat,  No Sneezing, Rhinitis, Ear Ache, Nasal Congestion, or Post Nasal Drip,  Cardio-vascular:  No Chest pain, Orthopnea, PND, Edema in Lower Extremities, Anasarca, Dizziness, Palpitations  Resp: +Dyspnea, No DOE, No Productive Cough, No Non-Productive Cough, No Hemoptysis, +Wheezing.    GI: No Heartburn, Indigestion, Abdominal Pain, Nausea, Vomiting, Diarrhea, Constipation, Hematemesis, Hematochezia, Melena, Change in Bowel Habits,  Loss of Appetite  GU: No Dysuria, No Change in Color of Urine, No Urgency or Urinary Frequency, No Flank pain.  Musculoskeletal: No Joint Pain or Swelling, No Decreased Range of Motion, No Back Pain.  Neurologic: No Syncope, No Seizures, Muscle Weakness, Paresthesia, Vision Disturbance or Loss, No Diplopia, No Vertigo, No Difficulty Walking,  Skin: No Rash or Lesions. Psych: No Change in Mood or Affect, No Depression or Anxiety, No Memory loss, No Confusion, or Hallucinations  Past Medical History  Diagnosis Date  . Hypertension   . PAD  (peripheral artery disease)   . Hypothyroidism   . Hypercholesteremia   . Obesity   . Shortness of breath   . Hyperthyroidism   . GERD (gastroesophageal reflux disease)   . Arthritis   . Diabetes mellitus   . Chronic obstructive asthma 05/28/2011  . COPD (chronic obstructive pulmonary disease)   . Claudication     LEA DOPPLER, 12/10/2011 - FEMORAL-FEMORAL BYPASS GRAFT-occluded; LEFT TIBIAL-appeared occluded;   . DOE (dyspnea on exertion)     NUCLEAR STRESS TEST, 08/03/2010 - EKG negative for ischemia  . SOB (shortness of breath)     2D ECHO, 03/01/2011 - EF 65-70%, moderate LVH,      Past Surgical History  Procedure Laterality Date  . Femoral-femoral bypass graft  05/09/2010    Right to Left Fem-Fem BPG by Dr. Scot Dock  . Tubal ligation    . Descending aortic aneurysm repair w/ stent      Stent repair  . Percutaneous stent intervention  12/12/2010    Rt common iliac stenosis stented with a 8x67mm ICAST covered stent resulting in a reduction of a 50% in-stent restenosis to 0%  . Percutaneous stent intervention  04/20/2010    Rt common iliac stenosis stented with a 8x18 Genesis OPTA stent balloon resulting in reduciton of a 70% stenosis to 0% residual  . Percutaneous stent intervention  10/18/2008    No intervention  . Percutaneous stent intervention  09/14/2008    Rt common iliac stenosis stented with a 4x10 Cordis stent resulting in a reduction of 99% stenosis to less than 20% residual  . Cardiac catheterization  10/18/2008    No intervention  . Cardiac catheterization  09/14/2008    Continue medical therapy  . Abdominal angiogram Left 12/12/2010    Procedure: ABDOMINAL ANGIOGRAM;  Surgeon: Lorretta Harp, MD;  Location: [facility] CATH LAB;  Service: Cardiovascular;  Laterality: Left;  . Percutaneous stent intervention Right 12/12/2010    Procedure: PERCUTANEOUS STENT INTERVENTION;  Surgeon: Lorretta Harp, MD;  Location: [facility] CATH LAB;  Service: Cardiovascular;  Laterality: Right;  .  Lower extremity angiogram N/A 04/05/2014    Procedure: LOWER EXTREMITY ANGIOGRAM;  Surgeon: Lorretta Harp, MD;  Location: [facility] CATH LAB;  Service: Cardiovascular;  Laterality: N/A;      Prior to Admission medications   Medication Sig Start Date End Date Taking? Authorizing Provider  acetaminophen (TYLENOL) 325 MG tablet Take 2 tablets (650 mg total) by mouth every 4 (four) hours as needed for headache or mild pain. 04/05/14   Erlene Quan, PA-C  amLODipine (NORVASC) 5 MG tablet Take 5 mg by mouth daily.    Historical Provider, MD  aspirin 81 MG chewable tablet Chew 81 mg by mouth daily.     Historical Provider, MD  glimepiride (AMARYL) 2 MG tablet Take 2 mg by mouth daily before breakfast.    Historical Provider, MD  HYDROcodone-acetaminophen (NORCO/VICODIN) 5-325 MG per tablet Take 1-2 tablets by mouth every 4 (four) hours as needed. 03/21/14   Lacretia Leigh, MD  hydrOXYzine (ATARAX/VISTARIL) 25 MG tablet Take 1 tablet (25 mg total) by mouth every 6 (six) hours as needed for anxiety or itching. 04/05/14   Erlene Quan, PA-C  levothyroxine (SYNTHROID, LEVOTHROID) 88 MCG tablet Take 1 tablet by mouth daily before breakfast.  07/10/12   Historical Provider, MD  Multiple Vitamins-Minerals (MULTIVITAMIN WITH MINERALS) tablet Take 1 tablet by mouth daily.    Historical Provider, MD  omeprazole (PRILOSEC) 40 MG capsule Take 40 mg by mouth daily.      Historical Provider, MD  PROAIR HFA 108 (90 BASE) MCG/ACT inhaler INHALE 2 PUFFS INTO THE LUNGS EVERY 6 HOURS AS NEEDED FOR WHEEZING 02/18/14   Chesley Mires, MD  rivaroxaban (XARELTO) 20 MG TABS tablet Take 1 tablet (20 mg total) by mouth daily with supper. 04/06/14   Lorretta Harp, MD  SYMBICORT 160-4.5 MCG/ACT inhaler USE 2 PUFFS BY MOUTH TWICE DAILY 04/01/14   Chesley Mires, MD  Travoprost, BAK Free, (TRAVATAN) 0.004 % SOLN ophthalmic solution Place 1 drop into both eyes at bedtime.     Historical Provider, MD     Allergies  Allergen Reactions  .  Glucophage [Metformin Hydrochloride] Itching and Rash    REPORTED BY PATIENT; STATES SYMPTOMS APPEARED AFTER THIRD DOES WITHIN THE LAST MONTH.  Marland Kitchen Alendronate Sodium Other (See Comments)    Unknown   . Avelox [Moxifloxacin Hcl In Nacl]     Itching,rash  . Latex     rash  . Metformin And Related   . Omnipaque [Iohexol] Other (See Comments)    Unknown   . Valsartan   . Adhesive [Tape] Rash  . Amoxicillin Itching and Rash  . Cephalexin Itching and Rash  . Contrast Media [Iodinated Diagnostic Agents] Itching and Rash  . Plavix [Clopidogrel Bisulfate] Itching and Rash    Social History:  reports that she quit smoking about 5 years ago. Her smoking use included Cigarettes. She has a 94 pack-year smoking history. She has never used smokeless tobacco. She reports that she does not drink alcohol or use illicit drugs.    Family History  Problem Relation Age of Onset  . Diabetes  Mother   . Heart disease Mother   . Hyperlipidemia Mother   . Cancer Sister   . Allergies Daughter     x3       Physical Exam:  GEN:  Pleasant Obese  75 y.o. Caucasian female examined and in no acute distress; cooperative with exam Filed Vitals:   04/10/15 0002 04/10/15 0215 04/10/15 0400  BP: 101/79 110/76 108/69  Pulse: 108 102 94  Temp: 98.2 F (36.8 C)    TempSrc: Oral    Resp: 20 20 22   SpO2: 99% 95% 94%   Blood pressure 108/69, pulse 94, temperature 98.2 F (36.8 C), temperature source Oral, resp. rate 22, SpO2 94 %. PSYCH: She is alert and oriented x4; does not appear anxious does not appear depressed; affect is normal HEENT: Normocephalic and Atraumatic, Mucous membranes pink; PERRLA; EOM intact; Fundi:  Benign;  No scleral icterus, Nares: Patent, Oropharynx: Clear, Fair Dentition,    Neck:  FROM, No Cervical Lymphadenopathy nor Thyromegaly or Carotid Bruit; No JVD; Breasts:: Not examined CHEST WALL: No tenderness CHEST: Normal respiration, clear to auscultation bilaterally HEART: Regular  rate and rhythm; no murmurs rubs or gallops BACK: No kyphosis or scoliosis; No CVA tenderness ABDOMEN: Positive Bowel Sounds, Obese, Soft Non-Tender, No Rebound or Guarding; No Masses, No Organomegaly. Rectal Exam: Not done EXTREMITIES: No Cyanosis, Clubbing, or Edema; No Ulcerations. Genitalia: not examined PULSES: 2+ and symmetric SKIN: Normal hydration no rash or ulceration CNS:  Alert and Oriented x 4, No Focal Deficits Vascular: pulses palpable throughout    Labs on Admission:  Basic Metabolic Panel:  Recent Labs Lab 04/10/15 0051  NA 136  K 4.0  CL 104  CO2 22  GLUCOSE 127*  BUN 15  CREATININE 0.95  CALCIUM 8.5*   Liver Function Tests: No results for input(s): AST, ALT, ALKPHOS, BILITOT, PROT, ALBUMIN in the last 168 hours. No results for input(s): LIPASE, AMYLASE in the last 168 hours. No results for input(s): AMMONIA in the last 168 hours. CBC:  Recent Labs Lab 04/10/15 0021  WBC 6.7  HGB 12.6  HCT 38.0  MCV 75.7*  PLT 255   Cardiac Enzymes:  Recent Labs Lab 04/10/15 0051  TROPONINI <0.03    BNP (last 3 results)  Recent Labs  04/10/15 0034  BNP 190.1*    ProBNP (last 3 results) No results for input(s): PROBNP in the last 8760 hours.  CBG: No results for input(s): GLUCAP in the last 168 hours.  Radiological Exams on Admission: Dg Chest 2 View  04/10/2015  CLINICAL DATA:  Increasing shortness of breath since 20/2 100 hours tonight. EXAM: CHEST  2 VIEW COMPARISON:  04/07/2015 FINDINGS: The heart size and mediastinal contours are within normal limits. Both lungs are clear. The visualized skeletal structures are unremarkable. IMPRESSION: No active cardiopulmonary disease. Electronically Signed   By: Lucienne Capers M.D.   On: 04/10/2015 00:35     EKG: Independently reviewed.     Assessment/Plan:   75 y.o. female with  Principal Problem:    COPD exacerbation (HCC)    Duonebs    Steroid taper    IV Magnesium x 1    O2 PRN    Monitor  O2 sats       Active Problems:      PAF (paroxysmal atrial fibrillation) (HCC)    On Xarelto Rx      Diabetes mellitus (HCC)    Hold Amaryl Rx    SSI coverage PRN    Check HbA1C  in AM    Essential hypertension    continue Amlodipine Rx    Monitor BPs      Hypothyroidism    Continue Levothyroxine Rx    DVT Prophylaxis    On Xarelto Rx      Code Status:     FULL CODE    Family Communication:   No Family Present    Disposition Plan:   Observation Status        Time spent: 49 Minutes      Theressa Millard [facility] Pager [phone]   If [place] Please Contact the Day Rounding Team MD for [facility]  If 7PM-7AM, Please Contact Night-Floor Coverage  [email or web address] Password [email or web address] 04/10/2015, 4:24 AM     ADDENDUM:   Patient was seen and examined on 04/10/2015

## 2015-04-10 NOTE — ED Notes (Signed)
Bed: WA13 Expected date:  Expected time:  Means of arrival:  Comments: EMS 

## 2015-04-11 LAB — GLUCOSE, CAPILLARY
GLUCOSE-CAPILLARY: 132 mg/dL — AB (ref 65–99)
GLUCOSE-CAPILLARY: 123 mg/dL — AB (ref 65–99)
GLUCOSE-CAPILLARY: 130 mg/dL — AB (ref 65–99)
GLUCOSE-CAPILLARY: 140 mg/dL — AB (ref 65–99)

## 2015-04-11 LAB — HEMOGLOBIN A1C
HEMOGLOBIN A1C: 5.7 % — AB (ref 4.8–5.6)
Mean Plasma Glucose: 117 mg/dL

## 2015-04-11 MED ORDER — METHYLPREDNISOLONE SODIUM SUCC 40 MG IJ SOLR
40.0000 mg | Freq: Two times a day (BID) | INTRAMUSCULAR | Status: DC
Start: 2015-04-11 — End: 2015-04-13
  Administered 2015-04-11 – 2015-04-12 (×4): 40 mg via INTRAVENOUS
  Filled 2015-04-11 (×4): qty 1

## 2015-04-11 MED ORDER — ENOXAPARIN SODIUM 40 MG/0.4ML ~~LOC~~ SOLN
40.0000 mg | SUBCUTANEOUS | Status: DC
  Administered 2015-04-11 – 2015-04-13 (×3): 40 mg via SUBCUTANEOUS
  Filled 2015-04-11 (×3): qty 0.4

## 2015-04-11 NOTE — Progress Notes (Signed)
Pt desats into low 90's when relaxing or sleeping.  Ambulating on room air lowest SpO2 was 91%.

## 2015-04-11 NOTE — Progress Notes (Addendum)
PROGRESS NOTE  [name] [id number] DOB: 01-23-1940 DOA: 04/10/2015 PCP: Maximino Greenland, MD  Assessment/Plan: COPD exacerbation ([place]) Duonebs Steroid taper IV Magnesium x 1 O2 PRN- weaned off on 3/27-- home O2 study  PAF (paroxysmal atrial fibrillation) ([place])- per note by Kerin Ransom in 2016 -not on anticoagulation per cardiology notes (pharmacy states patient has not filled either)- takes ASA daily    per vascular notes -coreg  Diabetes mellitus ([place]) SSI coverage PRN Check HbA1C in AM ? IF on amaryl?   Essential hypertension continue Amlodipine Rx Monitor BPs   Hypothyroidism Continue Levothyroxine Rx  Tobacco cessation  Code Status: full Family Communication: patient Disposition Plan:    Consultants:    Procedures:      HPI/Subjective: Breathing better  Objective: Filed Vitals:   04/11/15 0457 04/11/15 1001  BP: 121/66 113/71  Pulse: 90 102  Temp: 97.8 F (36.6 C)   Resp: 22    No intake or output data in the 24 hours ending 04/11/15 1443 Filed Weights   04/10/15 0518  Weight: 81.058 kg (178 lb 11.2 oz)    Exam:   General:  Awake, NAD  Cardiovascular: irr  Respiratory: moving more air  Abdomen: +BS, soft  Musculoskeletal: no edema   Data Reviewed: Basic Metabolic Panel:  Recent Labs Lab 04/10/15 0051 04/10/15 0558  NA 136 137  K 4.0 4.1  CL 104 104  CO2 22 23  GLUCOSE 127* 193*  BUN 15 14  CREATININE 0.95 0.95  CALCIUM 8.5* 8.7*   Liver Function  Tests: No results for input(s): AST, ALT, ALKPHOS, BILITOT, PROT, ALBUMIN in the last 168 hours. No results for input(s): LIPASE, AMYLASE in the last 168 hours. No results for input(s): AMMONIA in the last 168 hours. CBC:  Recent Labs Lab 04/10/15 0021 04/10/15 0558  WBC 6.7 6.2  HGB 12.6 12.8  HCT 38.0 39.9  MCV 75.7* 77.9*  PLT 255 215   Cardiac Enzymes:  Recent Labs Lab 04/10/15 0051  TROPONINI <0.03   BNP (last 3 results)  Recent Labs  04/10/15 0034  BNP 190.1*    ProBNP (last 3 results) No results for input(s): PROBNP in the last 8760 hours.  CBG:  Recent Labs Lab 04/10/15 1138 04/10/15 1658 04/10/15 2116 04/11/15 0742 04/11/15 1228  GLUCAP 267* 162* 162* 132* 123*    No results found for this or any previous visit (from the past 240 hour(s)).   Studies: Dg Chest 2 View  04/10/2015  CLINICAL DATA:  Increasing shortness of breath since 20/2 100 hours tonight. EXAM: CHEST  2 VIEW COMPARISON:  04/07/2015 FINDINGS: The heart size and mediastinal contours are within normal limits. Both lungs are clear. The visualized skeletal structures are unremarkable. IMPRESSION: No active cardiopulmonary disease. Electronically Signed   By: Lucienne Capers M.D.   On: 04/10/2015 00:35    Scheduled Meds: . amLODipine  5 mg Oral Daily  . antiseptic oral rinse  7 mL Mouth Rinse BID  . aspirin  81 mg Oral Daily  . buPROPion  150 mg Oral BID  . carvedilol  12.5 mg Oral BID  . enoxaparin (LOVENOX) injection  40 mg Subcutaneous Q24H  . insulin aspart  0-5 Units Subcutaneous QHS  . insulin aspart  0-9 Units Subcutaneous TID WC  . ipratropium-albuterol  3 mL Nebulization TID  . latanoprost  1 drop Both Eyes QHS  . levothyroxine  88 mcg Oral QAC breakfast  . methylPREDNISolone (SOLU-MEDROL) injection  40 mg Intravenous Q12H  .  multivitamin with minerals  1 tablet Oral Daily  . pantoprazole  40 mg Oral Daily  . sodium chloride flush  3 mL Intravenous Q12H  . sodium  chloride flush  3 mL Intravenous Q12H   Continuous Infusions:  Antibiotics Given (last 72 hours)    None      Principal Problem:   COPD exacerbation (HCC) Active Problems:   Essential hypertension   PAF (paroxysmal atrial fibrillation) ([place])   Diabetes mellitus ([place])   Hypothyroidism    Time spent: 25 min    [facility] Pager [phone]. If 7PM-7AM, please contact night-coverage at [email or web address], password [email or web address] 04/11/2015, 2:43 PM  LOS: 1 day

## 2015-04-11 NOTE — Progress Notes (Addendum)
Error

## 2015-04-11 NOTE — Plan of Care (Addendum)
Problem: Education: Goal: Knowledge of [place] [facility] Education information/materials will improve Outcome: Progressing .  Problem: Safety: Goal: Ability to remain free from injury will improve Outcome: Progressing Uses call light appropriately.   Calls for assistance.    Problem: Pain Managment: Goal: General experience of comfort will improve Outcome: Progressing .  Problem: Physical Regulation: Goal: Ability to maintain clinical measurements within normal limits will improve Outcome: Progressing . Goal: Will remain free from infection Outcome: Progressing BP 113/71 mmHg  Pulse 102  Temp(Src) 97.8 F (36.6 C) (Oral)  Resp 22  Ht 5\' 3"  (1.6 m)  Wt 81.058 kg (178 lb 11.2 oz)  BMI 31.66 kg/m2  SpO2 95%.  WBC within normal limits.    Problem: Skin Integrity: Goal: Risk for impaired skin integrity will decrease Outcome: Progressing .  Problem: Activity: Goal: Risk for activity intolerance will decrease Outcome: Progressing .  Problem: Fluid Volume: Goal: Ability to maintain a balanced intake and output will improve Outcome: Progressing .  Problem: Nutrition: Goal: Adequate nutrition will be maintained Outcome: Progressing . Educated on COPD.  COPD Gold folder given.

## 2015-04-12 LAB — GLUCOSE, CAPILLARY
Glucose-Capillary: 135 mg/dL — ABNORMAL HIGH (ref 65–99)
Glucose-Capillary: 133 mg/dL — ABNORMAL HIGH (ref 65–99)
Glucose-Capillary: 118 mg/dL — ABNORMAL HIGH (ref 65–99)
Glucose-Capillary: 138 mg/dL — ABNORMAL HIGH (ref 65–99)

## 2015-04-12 NOTE — Evaluation (Signed)
Physical Therapy Evaluation Patient Details Name: [name] MRN: [id number] DOB: [birth year] Today's Date: 04/12/2015   History of Present Illness  75 y.o. female with history of COPD, Atrial fibrillation, HTN, DM2 admitted with COPD exacerbation  Clinical Impression  Pt ambulated 200' with straight cane without loss of balance. SaO2 90% on RA with walking. Pt is at baseline with mobility, no further PT needed. Pt signing off.        Follow Up Recommendations No PT follow up    Equipment Recommendations  None recommended by PT    Recommendations for Other Services       Precautions / Restrictions Precautions Precautions: None Precaution Comments: pt denies h/o falls Restrictions Weight Bearing Restrictions: No      Mobility  Bed Mobility Overal bed mobility: Independent                Transfers Overall transfer level: Independent                  Ambulation/Gait Ambulation/Gait assistance: Modified independent (Device/Increase time) Ambulation Distance (Feet): 200 Feet Assistive device: Straight cane     Gait velocity interpretation: at or above normal speed for age/gender General Gait Details: SaO2 90% on RA with ambulation, 1/4 dyspnea, no LOB  Stairs            Wheelchair Mobility    Modified Rankin (Stroke Patients Only)       Balance Overall balance assessment: Modified Independent                                           Pertinent Vitals/Pain Pain Assessment: No/denies pain    Home Living Family/patient expects to be discharged to:: Private residence Living Arrangements: Alone Available Help at Discharge: Family;Available PRN/intermittently Type of Home: Apartment Home Access: Stairs to enter   Entrance Stairs-Number of Steps: 1 Home Layout: One level Home Equipment: Cane - single point;Shower seat      Prior Function Level of Independence: Independent         Comments: uses cane for  longer distances when her L knee starts to hurt     Hand Dominance        Extremity/Trunk Assessment   Upper Extremity Assessment: Overall WFL for tasks assessed           Lower Extremity Assessment: Overall WFL for tasks assessed      Cervical / Trunk Assessment: Normal  Communication   Communication: HOH  Cognition Arousal/Alertness: Awake/alert Behavior During Therapy: WFL for tasks assessed/performed Overall Cognitive Status: Within Functional Limits for tasks assessed                      General Comments      Exercises        Assessment/Plan    PT Assessment Patent does not need any further PT services  PT Diagnosis     PT Problem List    PT Treatment Interventions     PT Goals (Current goals can be found in the Care Plan section) Acute Rehab PT Goals Patient Stated Goal: return home PT Goal Formulation: With patient Time For Goal Achievement: 04/26/15 Potential to Achieve Goals: Good    Frequency     Barriers to discharge        Co-evaluation  End of Session Equipment Utilized During Treatment: Gait belt Activity Tolerance: Patient tolerated treatment well Patient left: in chair;with call bell/phone within reach;with chair alarm set Nurse Communication: Mobility status         Time: 1340-1353 PT Time Calculation (min) (ACUTE ONLY): 13 min   Charges:   PT Evaluation $PT Eval Low Complexity: 1 Procedure     PT G Codes:        Philomena Doheny 04/12/2015, 1:58 PM [phone]

## 2015-04-12 NOTE — Progress Notes (Signed)
PROGRESS NOTE  [name] [id number] DOB: 1940/08/17 DOA: 04/10/2015 PCP: Maximino Greenland, MD  Assessment/Plan: COPD exacerbation ([place]) Duonebs Steroid taper IV Magnesium x 1 O2 PRN- weaned off on 3/27-- home O2 study  PAF (paroxysmal atrial fibrillation) ([place])- per note by Kerin Ransom in 2016 -not on anticoagulation per cardiology notes (pharmacy states patient has not filled either)- takes ASA daily    per vascular notes -coreg  Diabetes mellitus ([place]) SSI coverage PRN Hgba1C 5.7   Essential hypertension continue Amlodipine Rx Monitor BPs   Hypothyroidism Continue Levothyroxine Rx  Tobacco cessation  Code Status: full Family Communication: patient Disposition Plan: home in AM with steroid taper?   Consultants:    Procedures:      HPI/Subjective: Some tachycardia today Breathing continues to improve  Objective: Filed Vitals:   04/12/15 0148 04/12/15 0438  BP: 121/68 111/67  Pulse: 85 71  Temp: 98.1 F (36.7 C) 97.9 F (36.6 C)  Resp: 20 20    Intake/Output Summary (Last 24 hours) at 04/12/15 1247 Last data filed at 04/11/15 2300  Gross per 24 hour  Intake    123 ml  Output      0 ml  Net    123 ml   Filed Weights   04/10/15 0518  Weight: 81.058 kg (178 lb 11.2 oz)    Exam:   General:  Awake, NAD  Cardiovascular: irr but fast 103-121  Respiratory: min wheezing  Abdomen: +BS, soft  Musculoskeletal: no edema   Data Reviewed: Basic Metabolic Panel:  Recent Labs Lab 04/10/15 0051 04/10/15 0558   NA 136 137  K 4.0 4.1  CL 104 104  CO2 22 23  GLUCOSE 127* 193*  BUN 15 14  CREATININE 0.95 0.95  CALCIUM 8.5* 8.7*   Liver Function Tests: No results for input(s): AST, ALT, ALKPHOS, BILITOT, PROT, ALBUMIN in the last 168 hours. No results for input(s): LIPASE, AMYLASE in the last 168 hours. No results for input(s): AMMONIA in the last 168 hours. CBC:  Recent Labs Lab 04/10/15 0021 04/10/15 0558  WBC 6.7 6.2  HGB 12.6 12.8  HCT 38.0 39.9  MCV 75.7* 77.9*  PLT 255 215   Cardiac Enzymes:  Recent Labs Lab 04/10/15 0051  TROPONINI <0.03   BNP (last 3 results)  Recent Labs  04/10/15 0034  BNP 190.1*    ProBNP (last 3 results) No results for input(s): PROBNP in the last 8760 hours.  CBG:  Recent Labs Lab 04/11/15 1228 04/11/15 1752 04/11/15 2157 04/12/15 0745 04/12/15 1207  GLUCAP 123* 130* 140* 135* 133*    No results found for this or any previous visit (from the past 240 hour(s)).   Studies: No results found.  Scheduled Meds: . amLODipine  5 mg Oral Daily  . antiseptic oral rinse  7 mL Mouth Rinse BID  . aspirin  81 mg Oral Daily  . buPROPion  150 mg Oral BID  . carvedilol  12.5 mg Oral BID  . enoxaparin (LOVENOX) injection  40 mg Subcutaneous Q24H  . insulin aspart  0-5 Units Subcutaneous QHS  . insulin aspart  0-9 Units Subcutaneous TID WC  . ipratropium-albuterol  3 mL Nebulization TID  . latanoprost  1 drop Both Eyes QHS  . levothyroxine  88 mcg Oral QAC breakfast  . methylPREDNISolone (SOLU-MEDROL) injection  40 mg Intravenous Q12H  . multivitamin with minerals  1 tablet Oral Daily  . pantoprazole  40 mg Oral Daily  . sodium chloride flush  3 mL  Intravenous Q12H  . sodium chloride flush  3 mL Intravenous Q12H   Continuous Infusions:  Antibiotics Given (last 72 hours)    None      Principal Problem:   COPD exacerbation (HCC) Active Problems:   Essential hypertension   PAF (paroxysmal atrial fibrillation) ([place])   Diabetes  mellitus ([place])   Hypothyroidism    Time spent: 25 min    [facility] Pager [phone]. If 7PM-7AM, please contact night-coverage at [email or web address], password [facility] 04/12/2015, 12:47 PM  LOS: 2 days

## 2015-04-13 DIAGNOSIS — I10 Essential (primary) hypertension: Secondary | ICD-10-CM

## 2015-04-13 DIAGNOSIS — J441 Chronic obstructive pulmonary disease with (acute) exacerbation: Principal | ICD-10-CM

## 2015-04-13 DIAGNOSIS — I480 Paroxysmal atrial fibrillation: Secondary | ICD-10-CM

## 2015-04-13 DIAGNOSIS — E038 Other specified hypothyroidism: Secondary | ICD-10-CM

## 2015-04-13 LAB — GLUCOSE, CAPILLARY
GLUCOSE-CAPILLARY: 133 mg/dL — AB (ref 65–99)
Glucose-Capillary: 153 mg/dL — ABNORMAL HIGH (ref 65–99)
Glucose-Capillary: 126 mg/dL — ABNORMAL HIGH (ref 65–99)
Glucose-Capillary: 157 mg/dL — ABNORMAL HIGH (ref 65–99)

## 2015-04-13 MED ORDER — METOPROLOL TARTRATE 25 MG PO TABS
25.0000 mg | ORAL_TABLET | Freq: Two times a day (BID) | ORAL | Status: DC
  Administered 2015-04-13 – 2015-04-14 (×2): 25 mg via ORAL
  Filled 2015-04-13 (×2): qty 1

## 2015-04-13 MED ORDER — PREDNISONE 50 MG PO TABS
50.0000 mg | ORAL_TABLET | Freq: Every day | ORAL | Status: DC
  Administered 2015-04-13 – 2015-04-14 (×2): 50 mg via ORAL
  Filled 2015-04-13 (×2): qty 1

## 2015-04-13 MED ORDER — DILTIAZEM HCL 30 MG PO TABS
30.0000 mg | ORAL_TABLET | Freq: Four times a day (QID) | ORAL | Status: DC
  Administered 2015-04-13 – 2015-04-14 (×4): 30 mg via ORAL
  Filled 2015-04-13 (×4): qty 1

## 2015-04-13 MED ORDER — IPRATROPIUM-ALBUTEROL 0.5-2.5 (3) MG/3ML IN SOLN
3.0000 mL | Freq: Two times a day (BID) | RESPIRATORY_TRACT | Status: DC
  Administered 2015-04-13 – 2015-04-14 (×2): 3 mL via RESPIRATORY_TRACT
  Filled 2015-04-13 (×2): qty 3

## 2015-04-13 NOTE — Care Management Important Message (Signed)
Important Message  Patient Details  Name: [name] MRN: [id number] Date of Birth: 04/20/1940   Medicare Important Message Given:  Yes    Camillo Flaming 04/13/2015, 9:07 AMImportant Message  Patient Details  Name: [name] MRN: [id number] Date of Birth: 10-27-1940   Medicare Important Message Given:  Yes    Camillo Flaming 04/13/2015, 9:06 AM

## 2015-04-13 NOTE — Progress Notes (Signed)
PROGRESS NOTE  [name] [id number] DOB: [birth year] DOA: 04/10/2015 PCP: Maximino Greenland, MD Brief History 75 year old female with a history of COPD with continued tobacco use, hypertension, paroxysmal atrial fibrillation, diabetes mellitus, and peripheral vascular diseasepresented with one-day history of worsening shortness of breathand wheezing. The patient was given intravenous Solu-Medrol and magnesium in the emergency department but continued to have shortness of breath.  As a result, the patient was admitted for COPD exacerbation. The patient was treated with intravenous steroids and improved clinically from a respiratory standpoint. Unfortunately, the patient's heart rate remained mildly increased  secondary to her COPD exacerbation. As a result,  Her antihypertensive regimen was adjusted to allow for increased AV nodal blockade.  Assessment/Plan: COPD exacerbation -Presently stable on room air -Obtain ambulatory pulse oximetry -We'll switch to oral prednisone -Continue bronchodilators Persistent atrial fibrillation -Patient does not want anticoagulation -Continue aspirin -CHADS-VASc = 5 -d/c amlodipine -start diltiazem 30 mg po q 6hrs due to HR 110-120 -continue coreg for now--will not push up dose due to COPD -optimize electrolytes -Check and BMP PVD -RCIA stent, occluded LCIA, Aug 2010. R-L FFBPG April 2012 ISR RCIA -PTA Nov 2012 -followed by [facility]--does not want surgery  H/O cardiac catheterization, 2010, no significant CAD after Positive stress test  Diabetes mellitus type 2 -Diet controlled -04/10/2015 hemoglobin A1c 5.7 Hypertension -Discontinue amlodipine -continue carvedilol    Family Communication:   Pt at beside Disposition Plan:   Home 3/30 if stable      Procedures/Studies: Dg Chest 2 View  04/10/2015  CLINICAL DATA:  Increasing shortness of breath since 20/2 100 hours tonight. EXAM: CHEST  2 VIEW COMPARISON:  04/07/2015 FINDINGS:  The heart size and mediastinal contours are within normal limits. Both lungs are clear. The visualized skeletal structures are unremarkable. IMPRESSION: No active cardiopulmonary disease. Electronically Signed   By: Lucienne Capers M.D.   On: 04/10/2015 00:35   Dg Chest 2 View  04/07/2015  CLINICAL DATA:  Chronic cough for several weeks EXAM: CHEST  2 VIEW COMPARISON:  02/08/2013 FINDINGS: The heart size and mediastinal contours are within normal limits. Both lungs are clear. The visualized skeletal structures are unremarkable. IMPRESSION: No acute abnormality noted. Electronically Signed   By: Inez Catalina M.D.   On: 04/07/2015 16:29         Subjective: Patient denies fevers, chills, headache, chest pain, dyspnea, nausea, vomiting, diarrhea, abdominal pain, dysuria, hematuria   Objective: Filed Vitals:   04/12/15 2138 04/13/15 0114 04/13/15 0442 04/13/15 0907  BP: 115/65 109/65 118/66   Pulse: 105 97 98   Temp: 98.2 F (36.8 C) 98.4 F (36.9 C) 98.4 F (36.9 C)   TempSrc: Oral Oral Oral   Resp: 20 20 20    Height:      Weight:      SpO2: 93% 90% 91% 95%    Intake/Output Summary (Last 24 hours) at 04/13/15 1144 Last data filed at 04/13/15 0829  Gross per 24 hour  Intake    480 ml  Output      0 ml  Net    480 ml   Weight change:  Exam:   General:  Pt is alert, follows commands appropriately, not in acute distress  HEENT: No icterus, No thrush, No neck mass, [place]/AT  Cardiovascular: IRRR, S1/S2, no rubs, no gallops  Respiratory: diminished BS  bilateral. Bibasilar crackles. No wheezing.  Abdomen: Soft/+BS, non tender, non distended, no guarding  Extremities: No  edema, No lymphangitis, No petechiae, No rashes, no synovitis  Data Reviewed: Basic Metabolic Panel:  Recent Labs Lab 04/10/15 0051 04/10/15 0558  NA 136 137  K 4.0 4.1  CL 104 104  CO2 22 23  GLUCOSE 127* 193*  BUN 15 14  CREATININE 0.95 0.95  CALCIUM 8.5* 8.7*   Liver Function Tests: No  results for input(s): AST, ALT, ALKPHOS, BILITOT, PROT, ALBUMIN in the last 168 hours. No results for input(s): LIPASE, AMYLASE in the last 168 hours. No results for input(s): AMMONIA in the last 168 hours. CBC:  Recent Labs Lab 04/10/15 0021 04/10/15 0558  WBC 6.7 6.2  HGB 12.6 12.8  HCT 38.0 39.9  MCV 75.7* 77.9*  PLT 255 215   Cardiac Enzymes:  Recent Labs Lab 04/10/15 0051  TROPONINI <0.03   BNP: Invalid input(s): POCBNP CBG:  Recent Labs Lab 04/12/15 0745 04/12/15 1207 04/12/15 1707 04/12/15 2137 04/13/15 0728  GLUCAP 135* 133* 118* 138* 133*    No results found for this or any previous visit (from the past 240 hour(s)).   Scheduled Meds: . antiseptic oral rinse  7 mL Mouth Rinse BID  . aspirin  81 mg Oral Daily  . buPROPion  150 mg Oral BID  . carvedilol  12.5 mg Oral BID  . diltiazem  30 mg Oral 4 times per day  . enoxaparin (LOVENOX) injection  40 mg Subcutaneous Q24H  . insulin aspart  0-5 Units Subcutaneous QHS  . insulin aspart  0-9 Units Subcutaneous TID WC  . ipratropium-albuterol  3 mL Nebulization BID  . latanoprost  1 drop Both Eyes QHS  . levothyroxine  88 mcg Oral QAC breakfast  . multivitamin with minerals  1 tablet Oral Daily  . pantoprazole  40 mg Oral Daily  . predniSONE  50 mg Oral Q breakfast  . sodium chloride flush  3 mL Intravenous Q12H  . sodium chloride flush  3 mL Intravenous Q12H   Continuous Infusions:    Shauniece Kwan, DO  [facility] Pager [phone]  If 7PM-7AM, please contact night-coverage [email or web address] Password [email or web address] 04/13/2015, 11:44 AM   LOS: 3 days

## 2015-04-13 NOTE — Progress Notes (Signed)
SATURATION QUALIFICATIONS: (This note is used to comply with regulatory documentation for home oxygen)  Patient Saturations on Room Air at Rest = 90%  Patient Saturations on Room Air while Ambulating = 83%  Patient Saturations on zero  Liters of oxygen while Ambulating = 83%  Please briefly explain why patient needs home oxygen:

## 2015-04-14 DIAGNOSIS — I4891 Unspecified atrial fibrillation: Secondary | ICD-10-CM

## 2015-04-14 DIAGNOSIS — J9601 Acute respiratory failure with hypoxia: Secondary | ICD-10-CM

## 2015-04-14 LAB — BASIC METABOLIC PANEL
ANION GAP: 7 (ref 5–15)
BUN: 30 mg/dL — ABNORMAL HIGH (ref 6–20)
CALCIUM: 8.3 mg/dL — AB (ref 8.9–10.3)
CO2: 27 mmol/L (ref 22–32)
Chloride: 103 mmol/L (ref 101–111)
Creatinine, Ser: 0.95 mg/dL (ref 0.44–1.00)
GFR, EST NON AFRICAN AMERICAN: 58 mL/min — AB (ref >60)
Glucose, Bld: 107 mg/dL — ABNORMAL HIGH (ref 65–99)
POTASSIUM: 3.8 mmol/L (ref 3.5–5.1)
Sodium: 137 mmol/L (ref 135–145)

## 2015-04-14 LAB — GLUCOSE, CAPILLARY
GLUCOSE-CAPILLARY: 92 mg/dL (ref 65–99)
GLUCOSE-CAPILLARY: 120 mg/dL — AB (ref 65–99)

## 2015-04-14 LAB — MAGNESIUM: Magnesium: 2.1 mg/dL (ref 1.7–2.4)

## 2015-04-14 MED ORDER — PREDNISONE 10 MG PO TABS: 50.0000 mg | ORAL_TABLET | Freq: Every day | ORAL | Status: DC

## 2015-04-14 MED ORDER — APIXABAN 5 MG PO TABS
5.0000 mg | ORAL_TABLET | Freq: Two times a day (BID) | ORAL | Status: DC
  Administered 2015-04-14: 5 mg via ORAL
  Filled 2015-04-14: qty 1

## 2015-04-14 MED ORDER — APIXABAN 5 MG PO TABS: 5.0000 mg | ORAL_TABLET | Freq: Two times a day (BID) | ORAL | Status: DC

## 2015-04-14 MED ORDER — DILTIAZEM HCL ER COATED BEADS 120 MG PO CP24
120.0000 mg | ORAL_CAPSULE | Freq: Every day | ORAL | Status: DC
  Administered 2015-04-14: 120 mg via ORAL
  Filled 2015-04-14: qty 1

## 2015-04-14 MED ORDER — ALBUTEROL SULFATE HFA 108 (90 BASE) MCG/ACT IN AERS: 2.0000 | INHALATION_SPRAY | Freq: Four times a day (QID) | RESPIRATORY_TRACT | Status: DC | PRN

## 2015-04-14 MED ORDER — METOPROLOL TARTRATE 25 MG PO TABS: 25.0000 mg | ORAL_TABLET | Freq: Two times a day (BID) | ORAL | Status: AC

## 2015-04-14 MED ORDER — DILTIAZEM HCL ER COATED BEADS 120 MG PO CP24: 120.0000 mg | ORAL_CAPSULE | Freq: Every day | ORAL | Status: DC

## 2015-04-14 MED ORDER — BUPROPION HCL ER (SR) 150 MG PO TB12: 150.0000 mg | ORAL_TABLET | Freq: Two times a day (BID) | ORAL | Status: DC

## 2015-04-14 NOTE — Progress Notes (Signed)
[facility] is providing the following services: Home oxygen  If patient discharges after hours, please call [phone].   Linward Headland 04/14/2015, 12:53 PM

## 2015-04-14 NOTE — Progress Notes (Signed)
SATURATION QUALIFICATIONS: (This note is used to comply with regulatory documentation for home oxygen)  Patient Saturations on Room Air at Rest = 90%  Patient Saturations on Room Air while Ambulating = 83%  Patient Saturations on 2 Liters of oxygen while Ambulating = 92%  Please briefly explain why patient needs home oxygen: 

## 2015-04-14 NOTE — Discharge Summary (Signed)
Physician Discharge Summary  JOSELLE BUMPAS [id number] DOB: [birth year] DOA: 04/10/2015  PCP: Maximino Greenland, MD  Admit date: 04/10/2015 Discharge date: 04/14/2015  Recommendations for Outpatient Follow-up:  1. Pt will need to follow up with PCP in 2 weeks post discharge 2. Please obtain BMP and CBC in 1 week  Discharge Diagnoses:  COPD exacerbation -Presently stable on room air -Obtain ambulatory pulse oximetry--desaturated to 83% -Home oxygen set up prior to discharge -switch to oral prednisone--home with paper -Continue bronchodilators--Symbicort and albuterol rescue inhaler Persistent atrial fibrillation -CHADS-VASc = 5 -d/c amlodipine -start diltiazem 30 mg po q 6hrs due to HR 110-120 -home with diltiazem CD 120 mg daily -discontinue the carvedilol, and started metoprolol tartrate due to greater hypotensive effects of carvedilol -optimize electrolytes -Discussed the risks, benefits, and alternatives of anticoagulation. The patient expressed understanding and agreed to start apixaban--30 day card given at d/c Acute respiratory failure with hypoxia -stable on 2L -home with 2 L [place] -secondary to COPD  PVD -RCIA stent, occluded LCIA, Aug 2010. R-L FFBPG April 2012 ISR RCIA -PTA Nov 2012 -followed by [facility]--does not want surgery  H/O cardiac catheterization, 2010, no significant CAD after Positive stress test  Diabetes mellitus type 2 -Diet controlled -04/10/2015 hemoglobin A1c 5.7 Hypertension -Discontinue amlodipine -started metoprolol tartrate 25 mg twice a day -Diltiazem CD 120 mg daily  Discharge Condition: stable  Disposition:  Follow-up Information    Follow up with KILROY,LUKE K, PA-C In 2 weeks.   Specialties:  Cardiology, Radiology   Contact information:   [address] [place] [address] [phone]       Follow up with SOOD,VINEET, MD In 2 weeks.   Specialty:  Pulmonary Disease   Contact information:   [address] [place] [address] [phone]     home  Diet:cardiac Wt Readings from Last 3 Encounters:  04/10/15 81.058 kg (178 lb 11.2 oz)  09/22/14 91.173 kg (201 lb)  04/14/14 98.431 kg (217 lb)    History of present illness:  75 year old female with a history of COPD with continued tobacco use, hypertension, paroxysmal atrial fibrillation, diabetes mellitus, and peripheral vascular diseasepresented with one-day history of worsening shortness of breathand wheezing. The patient was given intravenous Solu-Medrol and magnesium in the emergency department but continued to have shortness of breath. As a result, the patient was admitted for COPD exacerbation. The patient was treated with intravenous steroids and improved clinically from a respiratory standpoint. Unfortunately, the patient's heart rate remained mildly increased secondary to her COPD exacerbation. As a result, Her antihypertensive regimen was adjusted to allow for increased AV nodal blockade.  Discharge Exam: Filed Vitals:   04/14/15 0616 04/14/15 1100  BP: 119/89 112/69  Pulse: 93 80  Temp: 97.9 F (36.6 C) 97.8 F (36.6 C)  Resp: 18 18   Filed Vitals:   04/13/15 2042 04/14/15 0616 04/14/15 0935 04/14/15 1100  BP: 118/72 119/89  112/69  Pulse: 106 93  80  Temp: 98 F (36.7 C) 97.9 F (36.6 C)  97.8 F (36.6 C)  TempSrc: Oral Oral  Oral  Resp: 18 18  18   Height:      Weight:      SpO2: 97% 97% 97% 95%   General: A&O x 3, NAD, pleasant, cooperative Cardiovascular: IRRR, no rub, no gallop, no S3 Respiratory: diminished breath sounds without any wheezing. Bibasilar rales. Abdomen:soft, nontender, nondistended, positive bowel sounds Extremities: No edema, No lymphangitis, no petechiae  Discharge Instructions  Discharge Instructions    Diet - low sodium heart healthy    Complete by:  As directed      Increase activity slowly    Complete by:  As directed             Medication List    STOP taking  these medications        acetaminophen 325 MG tablet  Commonly known as:  TYLENOL     amLODipine 5 MG tablet  Commonly known as:  NORVASC     carvedilol 12.5 MG tablet  Commonly known as:  COREG     HYDROcodone-acetaminophen 5-325 MG tablet  Commonly known as:  NORCO/VICODIN     hydrOXYzine 25 MG tablet  Commonly known as:  ATARAX/VISTARIL      TAKE these medications        albuterol 108 (90 Base) MCG/ACT inhaler  Commonly known as:  PROVENTIL HFA;VENTOLIN HFA  Inhale 2 puffs into the lungs every 6 (six) hours as needed for wheezing or shortness of breath.     apixaban 5 MG Tabs tablet  Commonly known as:  ELIQUIS  Take 1 tablet (5 mg total) by mouth 2 (two) times daily.     buPROPion 150 MG 12 hr tablet  Commonly known as:  WELLBUTRIN SR  Take 150 mg by mouth 2 (two) times daily.     diltiazem 120 MG 24 hr capsule  Commonly known as:  CARDIZEM CD  Take 1 capsule (120 mg total) by mouth daily.  Start taking on:  04/15/2015     levothyroxine 88 MCG tablet  Commonly known as:  SYNTHROID, LEVOTHROID  Take 88 mcg by mouth daily before breakfast.     metoprolol tartrate 25 MG tablet  Commonly known as:  LOPRESSOR  Take 1 tablet (25 mg total) by mouth 2 (two) times daily.     multivitamin with minerals tablet  Take 1 tablet by mouth daily.     omeprazole 40 MG capsule  Commonly known as:  PRILOSEC  Take 40 mg by mouth daily.     predniSONE 10 MG tablet  Commonly known as:  DELTASONE  Take 5 tablets (50 mg total) by mouth daily with breakfast. And decrease by one tab daily  Start taking on:  04/15/2015     SYMBICORT 160-4.5 MCG/ACT inhaler  Generic drug:  budesonide-formoterol  USE 2 PUFFS BY MOUTH TWICE DAILY     Travoprost (BAK Free) 0.004 % Soln ophthalmic solution  Commonly known as:  TRAVATAN  Place 1 drop into both eyes at bedtime.         The results of significant diagnostics from this hospitalization (including imaging, microbiology, ancillary and  laboratory) are listed below for reference.    Significant Diagnostic Studies: Dg Chest 2 View  04/10/2015  CLINICAL DATA:  Increasing shortness of breath since 20/2 100 hours tonight. EXAM: CHEST  2 VIEW COMPARISON:  04/07/2015 FINDINGS: The heart size and mediastinal contours are within normal limits. Both lungs are clear. The visualized skeletal structures are unremarkable. IMPRESSION: No active cardiopulmonary disease. Electronically Signed   By: Lucienne Capers M.D.   On: 04/10/2015 00:35   Dg Chest 2 View  04/07/2015  CLINICAL DATA:  Chronic cough for several weeks EXAM: CHEST  2 VIEW COMPARISON:  02/08/2013 FINDINGS: The heart size and mediastinal contours are within normal limits. Both lungs are clear. The visualized skeletal structures are unremarkable. IMPRESSION: No acute abnormality noted. Electronically Signed   By: Inez Catalina  M.D.   On: 04/07/2015 16:29     Microbiology: No results found for this or any previous visit (from the past 240 hour(s)).   Labs: Basic Metabolic Panel:  Recent Labs Lab 04/10/15 0051 04/10/15 0558 04/14/15 0457  NA 136 137 137  K 4.0 4.1 3.8  CL 104 104 103  CO2 22 23 27   GLUCOSE 127* 193* 107*  BUN 15 14 30*  CREATININE 0.95 0.95 0.95  CALCIUM 8.5* 8.7* 8.3*  MG  --   --  2.1   Liver Function Tests: No results for input(s): AST, ALT, ALKPHOS, BILITOT, PROT, ALBUMIN in the last 168 hours. No results for input(s): LIPASE, AMYLASE in the last 168 hours. No results for input(s): AMMONIA in the last 168 hours. CBC:  Recent Labs Lab 04/10/15 0021 04/10/15 0558  WBC 6.7 6.2  HGB 12.6 12.8  HCT 38.0 39.9  MCV 75.7* 77.9*  PLT 255 215   Cardiac Enzymes:  Recent Labs Lab 04/10/15 0051  TROPONINI <0.03   BNP: Invalid input(s): POCBNP CBG:  Recent Labs Lab 04/13/15 1137 04/13/15 1608 04/13/15 2046 04/14/15 0721 04/14/15 1104  GLUCAP 153* 126* 157* 92 120*    Time coordinating discharge:  Greater than 30  minutes  Signed:  Shemeca Lukasik, DO [facility] Pager: [phone] 04/14/2015, 12:30 PM

## 2015-04-19 ENCOUNTER — Ambulatory Visit (INDEPENDENT_AMBULATORY_CARE_PROVIDER_SITE_OTHER): Payer: Medicare Other | Admitting: Neurology

## 2015-04-19 ENCOUNTER — Encounter: Payer: Self-pay | Admitting: Neurology

## 2015-04-19 VITALS — BP 124/80 | HR 81 | Ht 63.0 in | Wt 175.0 lb

## 2015-04-19 DIAGNOSIS — R413 Other amnesia: Secondary | ICD-10-CM | POA: Diagnosis not present

## 2015-04-19 HISTORY — DX: Other amnesia: R41.3

## 2015-04-19 MED ORDER — ALPRAZOLAM 0.5 MG PO TABS: ORAL_TABLET | ORAL | Status: DC

## 2015-04-19 NOTE — Progress Notes (Signed)
Reason for visit: Memory disturbance  Referring physician: Dr. Leward Quan is a 75 y.o. female  History of present illness:  [name] is a 75 year old right-handed white female with a history of what she believes is a sudden onset of memory problems that began about 2 months ago. The family who accompanies her today also agrees that the change was sudden. She has been relatively stable since that time, but her memory is still not normal. She is having difficulty recalling recent events, and she has had some difficulty keeping up with medications and appointments. She has gone to the bank several times and does not recall this. The patient has stopped driving. She has not had any problems with cooking, she may misplace things about the house on occasion. She denies any underlying fatigue, numbness or weakness of the extremities, or changes in balance. She denies any headache problems. She has not had any problems controlling the bowels or the bladder. The patient denies any sensation of anxiety or depression. 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 She has not had confirmation of syphilis with [facility] testing. She is sent to this office for an evaluation.  Past Medical History  Diagnosis Date  . Hypertension   . PAD (peripheral artery disease) ([place])   . Hypothyroidism   . Hypercholesteremia   . Obesity   . Shortness of breath   . Hyperthyroidism   . GERD (gastroesophageal reflux disease)   . Arthritis   . Diabetes mellitus   . Chronic obstructive asthma 05/28/2011  . COPD (chronic obstructive pulmonary disease) ([place])   . Claudication ([place])     LEA DOPPLER, 12/10/2011 - FEMORAL-FEMORAL BYPASS GRAFT-occluded; LEFT TIBIAL-appeared occluded;   . DOE (dyspnea on exertion)     NUCLEAR STRESS TEST, 08/03/2010 - EKG negative for ischemia  . SOB (shortness of breath)     2D ECHO, 03/01/2011 - EF 65-70%, moderate LVH,     . Memory difficulties 04/19/2015    Past Surgical History  Procedure Laterality Date  . Femoral-femoral bypass graft  05/09/2010    Right to Left Fem-Fem BPG by Dr. Scot Dock  . Tubal ligation    . Descending aortic aneurysm repair w/ stent      Stent repair  . Percutaneous stent intervention  12/12/2010    Rt common iliac stenosis stented with a 8x53mm ICAST covered stent resulting in a reduction of a 50% in-stent restenosis to 0%  . Percutaneous stent intervention  04/20/2010    Rt common iliac stenosis stented with a 8x18 Genesis OPTA stent balloon resulting in reduciton of a 70% stenosis to 0% residual  . Percutaneous stent intervention  10/18/2008    No intervention  . Percutaneous stent intervention  09/14/2008    Rt common iliac stenosis stented with a 4x10 Cordis stent resulting in a reduction of 99% stenosis to less than 20% residual  . Cardiac catheterization  10/18/2008    No intervention  . Cardiac catheterization  09/14/2008    Continue medical therapy  . Abdominal angiogram Left 12/12/2010    Procedure: ABDOMINAL ANGIOGRAM;  Surgeon: Lorretta Harp, MD;  Location[facility] CATH LAB;  Service: Cardiovascular;  Laterality: Left;  . Percutaneous stent intervention Right 12/12/2010    Procedure: PERCUTANEOUS STENT INTERVENTION;  Surgeon: Lorretta Harp, MD;  Location: [facility] CATH LAB;  Service: Cardiovascular;  Laterality: Right;  . Lower extremity angiogram N/A  04/05/2014    Procedure: LOWER EXTREMITY ANGIOGRAM;  Surgeon: Lorretta Harp, MD;  Location: [facility] CATH LAB;  Service: Cardiovascular;  Laterality: N/A;    Family History  Problem Relation Age of Onset  . Diabetes Mother   . Heart disease Mother   . Hyperlipidemia Mother   . Cancer Sister   . Allergies Daughter     x3    Social history:  reports that she quit smoking about 5 years ago. Her smoking use included Cigarettes. She has a 94 pack-year smoking history. She has never used smokeless tobacco. She reports that she does  not drink alcohol or use illicit drugs.  Medications:  Prior to Admission medications   Medication Sig Start Date End Date Taking? Authorizing Provider  albuterol (PROVENTIL HFA;VENTOLIN HFA) 108 (90 Base) MCG/ACT inhaler Inhale 2 puffs into the lungs every 6 (six) hours as needed for wheezing or shortness of breath. 04/14/15  Yes Orson Eva, MD  apixaban (ELIQUIS) 5 MG TABS tablet Take 1 tablet (5 mg total) by mouth 2 (two) times daily. 04/14/15  Yes Orson Eva, MD  buPROPion [facility] SR) 150 MG 12 hr tablet Take 1 tablet (150 mg total) by mouth 2 (two) times daily. 04/14/15  Yes Orson Eva, MD  diltiazem (CARDIZEM CD) 120 MG 24 hr capsule Take 1 capsule (120 mg total) by mouth daily. 04/15/15  Yes Orson Eva, MD  levothyroxine (SYNTHROID, LEVOTHROID) 88 MCG tablet Take 88 mcg by mouth daily before breakfast.   Yes Historical Provider, MD  metoprolol tartrate (LOPRESSOR) 25 MG tablet Take 1 tablet (25 mg total) by mouth 2 (two) times daily. 04/14/15  Yes Orson Eva, MD  Multiple Vitamins-Minerals (MULTIVITAMIN WITH MINERALS) tablet Take 1 tablet by mouth daily.   Yes Historical Provider, MD  omeprazole (PRILOSEC) 40 MG capsule Take 40 mg by mouth daily.     Yes Historical Provider, MD  predniSONE (DELTASONE) 10 MG tablet Take 5 tablets (50 mg total) by mouth daily with breakfast. And decrease by one tab daily 04/15/15  Yes Orson Eva, MD  SYMBICORT 160-4.5 MCG/ACT inhaler USE 2 PUFFS BY MOUTH TWICE DAILY 04/01/14  Yes Chesley Mires, MD  Travoprost, BAK Free, (TRAVATAN) 0.004 % SOLN ophthalmic solution Place 1 drop into both eyes at bedtime.    Yes Historical Provider, MD  ALPRAZolam Duanne Moron) 0.5 MG tablet Take 2 tablets approximately 45 minutes prior to the MRI study, take a third tablet if needed. 04/19/15   Kathrynn Ducking, MD      Allergies  Allergen Reactions  . Glucophage [Metformin Hydrochloride] Itching and Rash    REPORTED BY PATIENT; STATES SYMPTOMS APPEARED AFTER THIRD DOES WITHIN THE LAST MONTH.   Marland Kitchen Alendronate Sodium Other (See Comments)    Unknown   . Metformin And Related Other (See Comments)    unknown  . Omnipaque [Iohexol] Other (See Comments)    Unknown   . Valsartan Other (See Comments)    unknown  . Adhesive [Tape] Rash  . Amoxicillin Itching and Rash  . Avelox [Moxifloxacin Hcl In Nacl] Itching and Rash  . Cephalexin Itching and Rash  . Contrast Media [Iodinated Diagnostic Agents] Itching and Rash  . Latex Rash  . Plavix [Clopidogrel Bisulfate] Itching and Rash    ROS:  Out of a complete 14 system review of symptoms, the patient complains only of the following symptoms, and all other reviewed systems are negative.  Palpitations of the heart Shortness of breath Increased thirst Memory loss, tremor  Blood  pressure 124/80, pulse 81, height 5\' 3"  (1.6 m), weight 175 lb (79.379 kg).  Physical Exam  General: The patient is alert and cooperative at the time of the examination. The patient is moderately to markedly obese.  Eyes: Pupils are equal, round, and reactive to light. Discs are flat bilaterally.  Neck: The neck is supple, no carotid bruits are noted.  Respiratory: The respiratory examination is clear.  Cardiovascular: The cardiovascular examination reveals a regular rate and rhythm, no obvious murmurs or rubs are noted.  Skin: Extremities are without significant edema.  Neurologic Exam  Mental status: The patient is alert and oriented x 3 at the time of the examination. The patient has apparent normal recent and remote memory, with an apparently normal attention span and concentration ability. Mini-Mental Status Examination done today shows a total score 28/30. Patient is able to name 7 animals in 30 seconds.  Cranial nerves: Facial symmetry is present. There is good sensation of the face to pinprick and soft touch bilaterally. The strength of the facial muscles and the muscles to head turning and shoulder shrug are normal bilaterally. Speech is well  enunciated, no aphasia or dysarthria is noted. Extraocular movements are full. Visual fields are full. The tongue is midline, and the patient has symmetric elevation of the soft palate. No obvious hearing deficits are noted.  Motor: The motor testing reveals 5 over 5 strength of all 4 extremities. Good symmetric motor tone is noted throughout.  Sensory: Sensory testing is intact to pinprick, soft touch, vibration sensation, and position sense on all 4 extremities. No evidence of extinction is noted.  Coordination: Cerebellar testing reveals good finger-nose-finger and heel-to-shin bilaterally.  Gait and station: Gait is normal. Tandem gait is slightly unsteady. Romberg is negative. No drift is seen.  Reflexes: Deep tendon reflexes are symmetric and normal bilaterally. Toes are downgoing bilaterally.   Assessment/Plan:  1. Memory disturbance  The patient has a low normal limits B12 level, I recommended that she go on oral supplementation of 1000 g daily of vitamin B12. The patient will be set up for MRI of the brain. She reports a sudden onset of memory disturbance, and cerebrovascular disease will need to be in the differential diagnosis. The memory issues will need to be followed over time, she will follow-up in about 5 months. Currently, she is not operating a motor vehicle.  Jill Alexanders MD 04/19/2015 7:46 PM  [facility] [address] [place], [place] [address]  Phone [phone] Fax [phone]

## 2015-04-28 ENCOUNTER — Other Ambulatory Visit: Payer: PRIVATE HEALTH INSURANCE

## 2015-05-05 ENCOUNTER — Ambulatory Visit
Admission: RE | Admit: 2015-05-05 | Discharge: 2015-05-05 | Disposition: A | Discharge status: Home / Self Care | Payer: Medicare Other | Source: Ambulatory Visit | Attending: Neurology | Admitting: Neurology

## 2015-05-05 DIAGNOSIS — R413 Other amnesia: Secondary | ICD-10-CM | POA: Diagnosis not present

## 2015-05-06 ENCOUNTER — Telehealth: Payer: Self-pay | Admitting: Neurology

## 2015-05-06 ENCOUNTER — Other Ambulatory Visit: Payer: Self-pay | Admitting: Neurology

## 2015-05-06 MED ORDER — DONEPEZIL HCL 5 MG PO TABS: 5.0000 mg | ORAL_TABLET | Freq: Every day | ORAL | Status: DC

## 2015-05-06 NOTE — Telephone Encounter (Signed)
  I called patient, talk with the daughter. MRI the brain shows minimal white matter changes. The daughter believes that the memory problems are progressing over time, we will start Aricept at this time.  MRI brain 05/06/15:  IMPRESSION: This MRI of the brain without contrast shows the following: 1. Minimal generalized cortical atrophy more pronounced in the perisylvian region. This is likely normal for age. 2. Mild extent of T2/FLAIR hyperintense foci in the subcortical and deep white matter consistent with mild chronic microvascular ischemic change, typical for age. 3. There are no acute findings

## 2015-05-11 ENCOUNTER — Encounter: Payer: Self-pay | Admitting: Cardiovascular Disease

## 2015-05-11 ENCOUNTER — Ambulatory Visit (INDEPENDENT_AMBULATORY_CARE_PROVIDER_SITE_OTHER): Payer: Medicare Other | Admitting: Cardiovascular Disease

## 2015-05-11 VITALS — BP 110/69 | HR 76 | Ht 63.0 in | Wt 177.2 lb

## 2015-05-11 DIAGNOSIS — I10 Essential (primary) hypertension: Secondary | ICD-10-CM | POA: Diagnosis not present

## 2015-05-11 DIAGNOSIS — I6529 Occlusion and stenosis of unspecified carotid artery: Secondary | ICD-10-CM | POA: Diagnosis not present

## 2015-05-11 DIAGNOSIS — I739 Peripheral vascular disease, unspecified: Secondary | ICD-10-CM | POA: Diagnosis not present

## 2015-05-11 NOTE — Progress Notes (Signed)
05/11/2015 [name]   12/21/1940  [id number]  Primary Physician Nanci Pina, FNP Primary Cardiologist: Lorretta Harp MD Renae Gloss   HPI:  The patient is a 75 year old moderately overweight divorced Caucasian female, mother of 64, grandmother of 4 grandchildren, who I last saw in the office 04/28/13.She has a history of normal coronary arteries by catheterization, September 14, 2008, and normal LV systolic function with diastolic dysfunction. She has severe aortoiliac disease. I performed PTA and stenting of her right common iliac artery using a 10 x 4 SMART nitinol self-expanding stent. I attempted to recanalize her left common iliac artery unsuccessfully, and ultimately she underwent right-to-left femoral-femoral crossover grafting by Dr. Gae Gallop at my request. Her symptoms and ABIs have improved. Subsequently, it was determined in our lab that her femoral-femoral crossover graft had occluded, and on prior re-angiography she had in-stent restenosis, which I restented using an 8 x 38 iCAST covered stent with improvement in her Dopplers and ABIs. Her most recent Dopplers performed in October of 2014 revealed a right ABI 0.8 with a patent right common iliac artery stent, a left ABI of 0.49 with an occluded fem-fem crossover graft.She does complain of some dyspnea on exertion, which has not changed in frequency or severity. Her other problems include hypertension, hyperlipidemia, and non-insulin-requiring diabetes. Her last Doppler study performed in our office 03/23/14 revealed a right ABI 0.25 with an occluded right external iliac, left ABI 0.36. Her femorofemoral crossover graft was occluded as well. She does complain of left greater than right lower extremity claudication.   Current Outpatient Prescriptions  Medication Sig Dispense Refill  . albuterol (PROVENTIL HFA;VENTOLIN HFA) 108 (90 Base) MCG/ACT inhaler Inhale 2 puffs into the lungs every 6 (six) hours as needed  for wheezing or shortness of breath. 1 Inhaler 2  . ALPRAZolam (XANAX) 0.5 MG tablet Take 2 tablets approximately 45 minutes prior to the MRI study, take a third tablet if needed. 3 tablet 0  . apixaban (ELIQUIS) 5 MG TABS tablet Take 1 tablet (5 mg total) by mouth 2 (two) times daily. 60 tablet 1  . buPROPion (WELLBUTRIN SR) 150 MG 12 hr tablet Take 1 tablet (150 mg total) by mouth 2 (two) times daily. 60 tablet 0  . diltiazem (CARDIZEM CD) 120 MG 24 hr capsule Take 1 capsule (120 mg total) by mouth daily. 30 capsule 1  . donepezil (ARICEPT) 5 MG tablet TAKE 1 TABLET(5 MG) BY MOUTH AT BEDTIME 90 tablet 1  . levothyroxine (SYNTHROID, LEVOTHROID) 88 MCG tablet Take 88 mcg by mouth daily before breakfast.    . metoprolol tartrate (LOPRESSOR) 25 MG tablet Take 1 tablet (25 mg total) by mouth 2 (two) times daily. 60 tablet 1  . Multiple Vitamins-Minerals (MULTIVITAMIN WITH MINERALS) tablet Take 1 tablet by mouth daily.    Marland Kitchen omeprazole (PRILOSEC) 40 MG capsule Take 40 mg by mouth daily.      . predniSONE (DELTASONE) 10 MG tablet Take 5 tablets (50 mg total) by mouth daily with breakfast. And decrease by one tab daily 15 tablet 0  . SYMBICORT 160-4.5 MCG/ACT inhaler USE 2 PUFFS BY MOUTH TWICE DAILY 10.2 g 2  . Travoprost, BAK Free, (TRAVATAN) 0.004 % SOLN ophthalmic solution Place 1 drop into both eyes at bedtime.      No current facility-administered medications for this visit.    Allergies  Allergen Reactions  . Glucophage [Metformin Hydrochloride] Itching and Rash    REPORTED BY PATIENT; STATES SYMPTOMS  APPEARED AFTER THIRD DOES WITHIN THE LAST MONTH.  Marland Kitchen Alendronate Sodium Other (See Comments)    Unknown   . Metformin And Related Other (See Comments)    unknown  . Omnipaque [Iohexol] Other (See Comments)    Unknown   . Valsartan Other (See Comments)    unknown  . Adhesive [Tape] Rash  . Amoxicillin Itching and Rash  . Avelox [Moxifloxacin Hcl In Nacl] Itching and Rash  . Cephalexin  Itching and Rash  . Contrast Media [Iodinated Diagnostic Agents] Itching and Rash  . Latex Rash  . Plavix [Clopidogrel Bisulfate] Itching and Rash    Social History   Social History  . Marital Status: Divorced    Spouse Name: N/A  . Number of Children: 3  . Years of Education: N/A   Occupational History  . retired    Social History Main Topics  . Smoking status: Former Smoker -- 2.00 packs/day for 47 years    Types: Cigarettes    Quit date: 01/17/2010  . Smokeless tobacco: Never Used  . Alcohol Use: No  . Drug Use: No  . Sexual Activity: Not Currently   Other Topics Concern  . Not on file   Social History Narrative     Review of Systems: General: negative for chills, fever, night sweats or weight changes.  Cardiovascular: negative for chest pain, dyspnea on exertion, edema, orthopnea, palpitations, paroxysmal nocturnal dyspnea or shortness of breath Dermatological: negative for rash Respiratory: negative for cough or wheezing Urologic: negative for hematuria Abdominal: negative for nausea, vomiting, diarrhea, bright red blood per rectum, melena, or hematemesis Neurologic: negative for visual changes, syncope, or dizziness All other systems reviewed and are otherwise negative except as noted above.    Blood pressure 110/69, pulse 76, height 5\' 3"  (1.6 m), weight 177 lb 3.2 oz (80.377 kg).  General appearance: alert and no distress Neck: no adenopathy, no carotid bruit, no JVD, supple, symmetrical, trachea midline and thyroid not enlarged, symmetric, no tenderness/mass/nodules Lungs: clear to auscultation bilaterally Heart: regular rate and rhythm, S1, S2 normal, no murmur, click, rub or gallop Extremities: extremities normal, atraumatic, no cyanosis or edema  EKG not performed today  ASSESSMENT AND PLAN:   Essential hypertension History of hypertension blood pressure measured 110/69 She is on diltiazem and metoprolol. Continue current meds at current  dosing  PAF (paroxysmal atrial fibrillation) (HCC) History of paroxysmal atrial fibrillation on Eliquis .  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 She subsequently underwent right and left femorofemoral bypass grafting by Dr. Scot Dock which has subsequently occluded.Because of "instent restenosis I restarted her right common iliac artery with an 8 mm x 30 mm long ICast  covered stent which has shown to be occluded by duplex ultrasound performed one year ago.She does continue to have left greater than right lower extremity claudication. Dopplers performed 03/23/14 revealed a right ABI 0.25 in the left 0.36.      Lorretta Harp MD [facility] 05/11/2015 11:10 AM

## 2015-05-11 NOTE — Patient Instructions (Signed)
Medication Instructions:  Your physician recommends that you continue on your current medications as directed. Please refer to the Current Medication list given to you today.   Labwork: I will get your lab work from your Primary Care Physician.   Testing/Procedures: Your physician has requested that you have a carotid duplex. This test is an ultrasound of the carotid arteries in your neck. It looks at blood flow through these arteries that supply the brain with blood. Allow one hour for this exam. There are no restrictions or special instructions.  Your physician has requested that you have a lower extremity arterial doppler- During this test, ultrasound is used to evaluate arterial blood flow in the legs. Allow approximately one hour for this exam.    Follow-Up: Your physician wants you to follow-up in: 12 months with Dr. Berry. You will receive a reminder letter in the mail two months in advance. If you don't receive a letter, please call our office to schedule the follow-up appointment.   Any Other Special Instructions Will Be Listed Below (If Applicable).     If you need a refill on your cardiac medications before your next appointment, please call your pharmacy.   

## 2015-05-11 NOTE — Assessment & Plan Note (Addendum)
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 She subsequently underwent right and left femorofemoral bypass grafting by Dr. Scot Dock which has subsequently occluded.Because of "instent restenosis I restarted her right common iliac artery with an 8 mm x 30 mm long ICast  covered stent which has shown to be occluded by duplex ultrasound performed one year ago.She does continue to have left greater than right lower extremity claudication. Dopplers performed 03/23/14 revealed a right ABI 0.25 in the left 0.36.

## 2015-05-11 NOTE — Assessment & Plan Note (Signed)
History of paroxysmal atrial fibrillation on Eliquis .

## 2015-05-11 NOTE — Assessment & Plan Note (Signed)
History of hypertension blood pressure measured 110/69 She is on diltiazem and metoprolol. Continue current meds at current dosing

## 2015-05-12 ENCOUNTER — Other Ambulatory Visit: Payer: Self-pay | Admitting: Cardiovascular Disease

## 2015-05-12 DIAGNOSIS — I10 Essential (primary) hypertension: Secondary | ICD-10-CM

## 2015-05-12 DIAGNOSIS — I739 Peripheral vascular disease, unspecified: Secondary | ICD-10-CM

## 2015-05-12 DIAGNOSIS — I6529 Occlusion and stenosis of unspecified carotid artery: Secondary | ICD-10-CM

## 2015-05-31 ENCOUNTER — Ambulatory Visit (HOSPITAL_COMMUNITY)
Admission: RE | Admit: 2015-05-31 | Discharge: 2015-05-31 | Disposition: A | Discharge status: Home / Self Care | Payer: Medicare Other | Source: Ambulatory Visit | Attending: Internal Medicine | Admitting: Internal Medicine

## 2015-05-31 ENCOUNTER — Telehealth: Payer: Self-pay | Admitting: Cardiovascular Disease

## 2015-05-31 DIAGNOSIS — I6529 Occlusion and stenosis of unspecified carotid artery: Secondary | ICD-10-CM

## 2015-05-31 DIAGNOSIS — R938 Abnormal findings on diagnostic imaging of other specified body structures: Secondary | ICD-10-CM | POA: Insufficient documentation

## 2015-05-31 DIAGNOSIS — I6523 Occlusion and stenosis of bilateral carotid arteries: Secondary | ICD-10-CM

## 2015-05-31 DIAGNOSIS — K219 Gastro-esophageal reflux disease without esophagitis: Secondary | ICD-10-CM | POA: Insufficient documentation

## 2015-05-31 DIAGNOSIS — I10 Essential (primary) hypertension: Secondary | ICD-10-CM

## 2015-05-31 DIAGNOSIS — E7800 Pure hypercholesterolemia, unspecified: Secondary | ICD-10-CM | POA: Diagnosis not present

## 2015-05-31 DIAGNOSIS — I739 Peripheral vascular disease, unspecified: Secondary | ICD-10-CM

## 2015-05-31 DIAGNOSIS — E119 Type 2 diabetes mellitus without complications: Secondary | ICD-10-CM | POA: Insufficient documentation

## 2015-05-31 NOTE — Telephone Encounter (Signed)
-----   Message from Lorretta Harp, MD sent at 05/31/2015  2:12 PM EDT ----- No change from prior study. Repeat in 12 months.

## 2015-05-31 NOTE — Telephone Encounter (Signed)
Left message for pt to call.

## 2015-05-31 NOTE — Telephone Encounter (Signed)
New Message  Pt calling [facility] w/ RN- wanted to discuss tests- performed today 5/16. Please call back and discuss.

## 2015-05-31 NOTE — Telephone Encounter (Signed)
Advised daughter and will fax as requested

## 2015-05-31 NOTE — Telephone Encounter (Signed)
-----   Message from Lorretta Harp, MD sent at 05/31/2015  2:19 PM EDT ----- No change from prior study. Repeat in 12 months.

## 2015-05-31 NOTE — Telephone Encounter (Signed)
Pt calling to request test results done today be sent to Dr. Lavina Hamman her PCP

## 2015-05-31 NOTE — Telephone Encounter (Signed)
-----   Message from Lorretta Harp, MD sent at 05/31/2015  2:16 PM EDT ----- No change from prior study. Repeat in 6 months

## 2015-05-31 NOTE — Telephone Encounter (Signed)
[name] is asking that a copy of of test results from the Doppler that she had to today be sent over Dr. Silvio Pate . Ph#[phone].Marland KitchenPlease call if you have any questions   Thanks

## 2015-06-02 ENCOUNTER — Telehealth: Payer: Self-pay

## 2015-06-02 DIAGNOSIS — I739 Peripheral vascular disease, unspecified: Secondary | ICD-10-CM

## 2015-06-02 DIAGNOSIS — I779 Disorder of arteries and arterioles, unspecified: Secondary | ICD-10-CM

## 2015-06-02 NOTE — Telephone Encounter (Signed)
-----   Message from Lorretta Harp, MD sent at 05/31/2015  2:16 PM EDT ----- No change from prior study. Repeat in 6 months

## 2015-06-07 NOTE — Telephone Encounter (Signed)
Berneta Levins NP actually at phone # [phone] and fax # [phone]

## 2015-07-07 ENCOUNTER — Other Ambulatory Visit: Payer: Self-pay | Admitting: Neurology

## 2015-08-01 ENCOUNTER — Encounter: Payer: Self-pay | Admitting: Pulmonary Disease

## 2015-08-01 ENCOUNTER — Ambulatory Visit (INDEPENDENT_AMBULATORY_CARE_PROVIDER_SITE_OTHER): Payer: Medicare Other | Admitting: Pulmonary Disease

## 2015-08-01 VITALS — BP 112/82 | HR 84 | Ht 63.0 in | Wt 181.0 lb

## 2015-08-01 DIAGNOSIS — J449 Chronic obstructive pulmonary disease, unspecified: Secondary | ICD-10-CM

## 2015-08-01 DIAGNOSIS — Z720 Tobacco use: Secondary | ICD-10-CM

## 2015-08-01 DIAGNOSIS — F17200 Nicotine dependence, unspecified, uncomplicated: Secondary | ICD-10-CM

## 2015-08-01 DIAGNOSIS — J45909 Unspecified asthma, uncomplicated: Secondary | ICD-10-CM | POA: Diagnosis not present

## 2015-08-01 MED ORDER — BUPROPION HCL ER (SMOKING DET) 150 MG PO TB12: 150.0000 mg | ORAL_TABLET | Freq: Two times a day (BID) | ORAL | Status: DC

## 2015-08-01 NOTE — Patient Instructions (Signed)
Bupropion 150 mg pill >> 1 pill daily for 3 days, then 1 pill twice per day.  Stop smoking after being on bupropion for 1 week  Follow up in 3 months

## 2015-08-01 NOTE — Progress Notes (Signed)
Current Outpatient Prescriptions on File Prior to Visit  Medication Sig  . albuterol (PROVENTIL HFA;VENTOLIN HFA) 108 (90 Base) MCG/ACT inhaler Inhale 2 puffs into the lungs every 6 (six) hours as needed for wheezing or shortness of breath.  . diltiazem (CARDIZEM CD) 120 MG 24 hr capsule Take 1 capsule (120 mg total) by mouth daily.  Marland Kitchen donepezil (ARICEPT) 5 MG tablet TAKE 1 TABLET(5 MG) BY MOUTH AT BEDTIME  . levothyroxine (SYNTHROID, LEVOTHROID) 88 MCG tablet Take 88 mcg by mouth daily before breakfast.  . metoprolol tartrate (LOPRESSOR) 25 MG tablet Take 1 tablet (25 mg total) by mouth 2 (two) times daily.  . SYMBICORT 160-4.5 MCG/ACT inhaler USE 2 PUFFS BY MOUTH TWICE DAILY   No current facility-administered medications on file prior to visit.    Chief Complaint  Patient presents with  . Follow-up    Last seen 2015 - denies any current breathing issues. Pt seen in ED in March 2017 for cough/SOB.     Pulmonary tests V/Q scan 05/15/10 >> very low probabilty for pulmonary embolism PFT 05/28/11 >> FEV1 1.01 (54%), FEV1% 73, TLC 4.44 (100%), RV 2.74 (156%), DLCO 49%, +BD response  Cardiac tests Echo 03/01/11 >> mod LVH, EF 65 to [id number], grade 1 diastolic dysfx, mild LA dilation, mild RA/RV dilation  Past medical history HTN, PAD, HLD, A fib, CAD, Hypothyroidism, GERD, DM, Impaired memory  PSHx, Allergies, Fhx, Shx reviewed.  Vital signs BP 112/82 mmHg  Pulse 84  Ht 5\' 3"  (1.6 m)  Wt 181 lb (82.101 kg)  BMI 32.07 kg/m2  SpO2 97%   History of Present Illness: [name] is a 75 y.o. female former smoker with dyspnea, and COPD with asthma.  I last saw her in 2015.  She was in ER in March for COPD exacerbation.  She started smoking again >> almost 1 ppd.  She use bupropion before.  She gets occasional cough and wheeze.  She is not bringing up sputum.  Denies chest pain.  She is limited in activity by leg pains and shortness of breath >> can walk around grocery store w/o  stopping, but can't walk up stairs.  Physical Exam:  HEENT - No sinus tenderness, no oral exudate, no LAN  Cardiac - s1s2 regular, no murmur  Chest - faint wheeze that cleared with coughing Abdomen - obese, soft, nontender  Extremities - no edema Neurologic - normals strength Skin - no rashes  Psychiatric - normal mood, behavior   Assessment/Plan:  COPD with asthma. - continue symbicort and prn albuterol  Tobacco abuse. - reviewed options to help with smoking cessation - will have her try using bupropion again >> discussed dosing schedule, quit date, and side effects to monitor for  Peripheral artery disease. - discussed how tobacco abuse is contributing to this   Patient Instructions  Bupropion 150 mg pill >> 1 pill daily for 3 days, then 1 pill twice per day.  Stop smoking after being on bupropion for 1 week  Follow up in 3 months     Chesley Mires, MD [place] 08/01/2015, 3:16 PM Pager:  [phone]

## 2015-09-01 ENCOUNTER — Telehealth: Payer: Self-pay

## 2015-09-01 NOTE — Telephone Encounter (Signed)
Received a call from patient's daughter Cecille Rubin.She stated her mother has appointment to have carotid dopplers,abi's at Dr.Dickson's office 10/05/15.Stated her sister told her she already had done.She was calling to find out what date test were done.Advised mother had dopplers done 05/31/15.Advised she needs to call Dr.Dickson's office and let them know.

## 2015-09-02 ENCOUNTER — Telehealth: Payer: Self-pay | Admitting: Vascular Surgery

## 2015-09-02 NOTE — Telephone Encounter (Addendum)
-----   Message from Mena Goes, RN sent at 09/01/2015  3:58 PM EDT ----- Regarding: RE: Appt Question Contact: [phone] No she doesn't need to follow up here as long as she keeps going to Dr. Gwenlyn Found.   ----- Message ----- From: Lujean Amel Sent: 09/01/2015  12:42 PM To: [facility], RN, Madalyn Rob, LPN, # Subject: Appt Question                                   Pt's daughter Cecille Rubin called to inquire if this appt for 10/05/15 was necessary? The patient was seen by [facility] in April and he ordered a carotid doppler and LE arterial study that was done there in May.  The daughter doesn't want her Mom to have the studies repeated so close together if it is not necessary. Please advise and I can call Lor back to cancel or reschedule this. Thanks, Laurann Montana pt's daughter and informed her of the above note from [place]. Cancelled appts and notified the daughter Cecille Rubin of this as well. awt

## 2015-09-08 ENCOUNTER — Other Ambulatory Visit: Payer: Self-pay | Admitting: Family Medicine

## 2015-09-08 DIAGNOSIS — Z1231 Encounter for screening mammogram for malignant neoplasm of breast: Secondary | ICD-10-CM

## 2015-09-08 DIAGNOSIS — E2839 Other primary ovarian failure: Secondary | ICD-10-CM

## 2015-09-16 ENCOUNTER — Ambulatory Visit
Admission: RE | Admit: 2015-09-16 | Discharge: 2015-09-16 | Disposition: A | Discharge status: Home / Self Care | Payer: Medicare Other | Source: Ambulatory Visit | Attending: Family Medicine | Admitting: Family Medicine

## 2015-09-16 DIAGNOSIS — E2839 Other primary ovarian failure: Secondary | ICD-10-CM

## 2015-09-16 DIAGNOSIS — Z1231 Encounter for screening mammogram for malignant neoplasm of breast: Secondary | ICD-10-CM

## 2015-09-18 ENCOUNTER — Other Ambulatory Visit: Payer: Self-pay | Admitting: Neurology

## 2015-09-20 ENCOUNTER — Other Ambulatory Visit: Payer: Self-pay | Admitting: Family Medicine

## 2015-09-20 DIAGNOSIS — R928 Other abnormal and inconclusive findings on diagnostic imaging of breast: Secondary | ICD-10-CM

## 2015-09-22 ENCOUNTER — Other Ambulatory Visit: Payer: Self-pay | Admitting: Family Medicine

## 2015-09-26 ENCOUNTER — Ambulatory Visit
Admission: RE | Admit: 2015-09-26 | Discharge: 2015-09-26 | Disposition: A | Discharge status: Home / Self Care | Payer: Medicare Other | Source: Ambulatory Visit | Attending: Family Medicine | Admitting: Family Medicine

## 2015-09-26 ENCOUNTER — Other Ambulatory Visit: Payer: Self-pay | Admitting: Family Medicine

## 2015-09-26 DIAGNOSIS — N6320 Unspecified lump in the left breast, unspecified quadrant: Secondary | ICD-10-CM

## 2015-09-26 DIAGNOSIS — R928 Other abnormal and inconclusive findings on diagnostic imaging of breast: Secondary | ICD-10-CM

## 2015-09-27 ENCOUNTER — Encounter: Payer: Self-pay | Admitting: Neurology

## 2015-09-27 ENCOUNTER — Other Ambulatory Visit: Payer: Self-pay | Admitting: Family Medicine

## 2015-09-27 ENCOUNTER — Ambulatory Visit
Admission: RE | Admit: 2015-09-27 | Discharge: 2015-09-27 | Disposition: A | Discharge status: Home / Self Care | Payer: Medicare Other | Source: Ambulatory Visit | Attending: Family Medicine | Admitting: Family Medicine

## 2015-09-27 ENCOUNTER — Ambulatory Visit (INDEPENDENT_AMBULATORY_CARE_PROVIDER_SITE_OTHER): Payer: Medicare Other | Admitting: Neurology

## 2015-09-27 VITALS — BP 161/91 | HR 66 | Ht 63.0 in | Wt 184.8 lb

## 2015-09-27 DIAGNOSIS — R413 Other amnesia: Secondary | ICD-10-CM

## 2015-09-27 DIAGNOSIS — N6320 Unspecified lump in the left breast, unspecified quadrant: Secondary | ICD-10-CM

## 2015-09-27 MED ORDER — DONEPEZIL HCL 10 MG PO TABS: 10.0000 mg | ORAL_TABLET | Freq: Every day | ORAL | 5 refills | Status: DC

## 2015-09-27 NOTE — Progress Notes (Signed)
Reason for visit: Memory disturbance  [name] is an 75 y.o. female  History of present illness:  [name] is a 75 year old right-handed white female with a history of a mild memory disturbance. The patient does operate a motor vehicle, but she no longer handles the finances, she allows the family to manage the medications and keep up with appointments. The patient comes in with her daughter today, they have not noted any significant change in her memory since last seen. She seems a little bit confused at times in the morning or late in the evening, but she does well at midday. The patient has not given up any activities of daily living since last seen. There have been no issues with getting lost while driving. The patient is on low-dose Aricept taking 5 mg at night, she is tolerating the medication well.  Past Medical History:  Diagnosis Date  . Arthritis   . Chronic obstructive asthma 05/28/2011  . Claudication ([place])    LEA DOPPLER, 12/10/2011 - FEMORAL-FEMORAL BYPASS GRAFT-occluded; LEFT TIBIAL-appeared occluded;   . COPD (chronic obstructive pulmonary disease) ([place])   . Diabetes mellitus   . DOE (dyspnea on exertion)    NUCLEAR STRESS TEST, 08/03/2010 - EKG negative for ischemia  . GERD (gastroesophageal reflux disease)   . Hypercholesteremia   . Hypertension   . Hyperthyroidism   . Hypothyroidism   . Memory difficulties 04/19/2015  . Obesity   . PAD (peripheral artery disease) ([place])   . Shortness of breath   . SOB (shortness of breath)    2D ECHO, 03/01/2011 - EF 65-70%, moderate LVH,     Past Surgical History:  Procedure Laterality Date  . ABDOMINAL ANGIOGRAM Left 12/12/2010   Procedure: ABDOMINAL ANGIOGRAM;  Surgeon: Lorretta Harp, MD;  Location: [facility] CATH LAB;  Service: Cardiovascular;  Laterality: Left;  . CARDIAC CATHETERIZATION  10/18/2008   No intervention  . CARDIAC CATHETERIZATION  09/14/2008   Continue medical therapy  . DESCENDING AORTIC ANEURYSM REPAIR  W/ STENT     Stent repair  . FEMORAL-FEMORAL BYPASS GRAFT  05/09/2010   Right to Left Fem-Fem BPG by Dr. Scot Dock  . LOWER EXTREMITY ANGIOGRAM N/A 04/05/2014   Procedure: LOWER EXTREMITY ANGIOGRAM;  Surgeon: Lorretta Harp, MD;  Location: [facility] CATH LAB;  Service: Cardiovascular;  Laterality: N/A;  . PERCUTANEOUS STENT INTERVENTION  12/12/2010   Rt common iliac stenosis stented with a 8x57mm ICAST covered stent resulting in a reduction of a 50% in-stent restenosis to 0%  . PERCUTANEOUS STENT INTERVENTION  04/20/2010   Rt common iliac stenosis stented with a 8x18 Genesis OPTA stent balloon resulting in reduciton of a 70% stenosis to 0% residual  . PERCUTANEOUS STENT INTERVENTION  10/18/2008   No intervention  . PERCUTANEOUS STENT INTERVENTION  09/14/2008   Rt common iliac stenosis stented with a 4x10 Cordis stent resulting in a reduction of 99% stenosis to less than 20% residual  . PERCUTANEOUS STENT INTERVENTION Right 12/12/2010   Procedure: PERCUTANEOUS STENT INTERVENTION;  Surgeon: Lorretta Harp, MD;  Location: [facility] CATH LAB;  Service: Cardiovascular;  Laterality: Right;  . TUBAL LIGATION      Family History  Problem Relation Age of Onset  . Diabetes Mother   . Heart disease Mother   . Hyperlipidemia Mother   . Cancer Sister   . Allergies Daughter     x3    Social history:  reports that she has been smoking Cigarettes.  She has a  18.80 pack-year smoking history. She has never used smokeless tobacco. She reports that she does not drink alcohol or use drugs.    Allergies  Allergen Reactions  . Glucophage [Metformin Hydrochloride] Itching and Rash    REPORTED BY PATIENT; STATES SYMPTOMS APPEARED AFTER THIRD DOES WITHIN THE LAST MONTH.  Marland Kitchen Alendronate Sodium Other (See Comments)    Unknown   . Metformin And Related Other (See Comments)    unknown  . Omnipaque [Iohexol] Other (See Comments)    Unknown   . Valsartan Other (See Comments)    unknown  . Adhesive [Tape] Rash  .  Amoxicillin Itching and Rash  . Avelox [Moxifloxacin Hcl In Nacl] Itching and Rash  . Cephalexin Itching and Rash  . Contrast Media [Iodinated Diagnostic Agents] Itching and Rash  . Latex Rash  . Plavix [Clopidogrel Bisulfate] Itching and Rash    Medications:  Prior to Admission medications   Medication Sig Start Date End Date Taking? Authorizing Provider  albuterol (PROVENTIL HFA;VENTOLIN HFA) 108 (90 Base) MCG/ACT inhaler Inhale 2 puffs into the lungs every 6 (six) hours as needed for wheezing or shortness of breath. 04/14/15  Yes Orson Eva, MD  buPROPion (ZYBAN) 150 MG 12 hr tablet Take 1 tablet (150 mg total) by mouth 2 (two) times daily. 08/01/15  Yes Chesley Mires, MD  diltiazem (CARDIZEM CD) 120 MG 24 hr capsule Take 1 capsule (120 mg total) by mouth daily. 04/15/15  Yes Orson Eva, MD  donepezil (ARICEPT) 5 MG tablet TAKE 1 TABLET(5 MG) BY MOUTH AT BEDTIME 05/09/15  Yes Kathrynn Ducking, MD  levothyroxine (SYNTHROID, LEVOTHROID) 88 MCG tablet Take 88 mcg by mouth daily before breakfast.   Yes Historical Provider, MD  metoprolol tartrate (LOPRESSOR) 25 MG tablet Take 1 tablet (25 mg total) by mouth 2 (two) times daily. 04/14/15  Yes Orson Eva, MD  SYMBICORT 160-4.5 MCG/ACT inhaler USE 2 PUFFS BY MOUTH TWICE DAILY 04/01/14  Yes Chesley Mires, MD  vitamin B-12 (CYANOCOBALAMIN) 1000 MCG tablet Take 1,000 mcg by mouth daily.   Yes Historical Provider, MD    ROS:  Out of a complete 14 system review of symptoms, the patient complains only of the following symptoms, and all other reviewed systems are negative.  Memory disturbance  Blood pressure (!) 161/91, pulse 66, height 5\' 3"  (1.6 m), weight 184 lb 12 oz (83.8 kg).  Physical Exam  General: The patient is alert and cooperative at the time of the examination. The patient is moderately obese.  Skin: No significant peripheral edema is noted.   Neurologic Exam  Mental status: The patient is alert and oriented x 3 at the time of the  examination. The patient has apparent normal recent and remote memory, with an apparently normal attention span and concentration ability. Mini-Mental Status Examination done today shows a total score 26/30.   Cranial nerves: Facial symmetry is present. Speech is normal, no aphasia or dysarthria is noted. Extraocular movements are full. Visual fields are full.  Motor: The patient has good strength in all 4 extremities.  Sensory examination: Soft touch sensation is symmetric on the face, arms, and legs.  Coordination: The patient has good finger-nose-finger and heel-to-shin bilaterally.  Gait and station: The patient has a normal gait. Tandem gait is normal. Romberg is negative. No drift is seen.  Reflexes: Deep tendon reflexes are symmetric.   MRI brain 05/06/15:  IMPRESSION: This MRI of the brain without contrast shows the following: 1. Minimal generalized cortical atrophy more pronounced in  the perisylvian region. This is likely normal for age. 2. Mild extent of T2/FLAIR hyperintense foci in the subcortical and deep white matter consistent with mild chronic microvascular ischemic change, typical for age. 3. There are no acute findings  * MRI scan images were reviewed online. I agree with the written report.    Assessment/Plan:  1. Progressive memory disturbance  MRI of the brain did not show evidence of extensive white matter changes. No evidence of a stroke that would lead to a sudden change in memory was seen. The patient will be increased on Aricept taking 10 mg at night. She will follow-up in 6 months. A prescription was sent in for the Aricept.  Jill Alexanders MD 09/27/2015 8:51 AM  [facility] [address] [place], [place] [address]  Phone [phone] Fax [phone]

## 2015-10-05 ENCOUNTER — Encounter (HOSPITAL_COMMUNITY): Payer: Medicare Other

## 2015-10-05 ENCOUNTER — Ambulatory Visit: Payer: Medicare Other | Admitting: Vascular Surgery

## 2015-11-07 ENCOUNTER — Ambulatory Visit (INDEPENDENT_AMBULATORY_CARE_PROVIDER_SITE_OTHER): Payer: Medicare Other | Admitting: Pulmonary Disease

## 2015-11-07 ENCOUNTER — Encounter: Payer: Self-pay | Admitting: Pulmonary Disease

## 2015-11-07 VITALS — BP 132/70 | HR 59 | Ht 62.0 in | Wt 203.8 lb

## 2015-11-07 DIAGNOSIS — Z720 Tobacco use: Secondary | ICD-10-CM | POA: Diagnosis not present

## 2015-11-07 DIAGNOSIS — J449 Chronic obstructive pulmonary disease, unspecified: Secondary | ICD-10-CM | POA: Diagnosis not present

## 2015-11-07 DIAGNOSIS — K219 Gastro-esophageal reflux disease without esophagitis: Secondary | ICD-10-CM | POA: Diagnosis not present

## 2015-11-07 NOTE — Progress Notes (Signed)
Current Outpatient Prescriptions on File Prior to Visit  Medication Sig  . albuterol (PROVENTIL HFA;VENTOLIN HFA) 108 (90 Base) MCG/ACT inhaler Inhale 2 puffs into the lungs every 6 (six) hours as needed for wheezing or shortness of breath.  . diltiazem (CARDIZEM CD) 120 MG 24 hr capsule Take 1 capsule (120 mg total) by mouth daily.  Marland Kitchen donepezil (ARICEPT) 10 MG tablet Take 1 tablet (10 mg total) by mouth at bedtime.  Marland Kitchen levothyroxine (SYNTHROID, LEVOTHROID) 88 MCG tablet Take 88 mcg by mouth daily before breakfast.  . metoprolol tartrate (LOPRESSOR) 25 MG tablet Take 1 tablet (25 mg total) by mouth 2 (two) times daily.  . SYMBICORT 160-4.5 MCG/ACT inhaler USE 2 PUFFS BY MOUTH TWICE DAILY  . vitamin B-12 (CYANOCOBALAMIN) 1000 MCG tablet Take 1,000 mcg by mouth daily.   No current facility-administered medications on file prior to visit.     Chief Complaint  Patient presents with  . Follow-up    pt has dry cough with activity.     Pulmonary tests V/Q scan 05/15/10 >> very low probabilty for pulmonary embolism PFT 05/28/11 >> FEV1 1.01 (54%), FEV1% 73, TLC 4.44 (100%), RV 2.74 (156%), DLCO 49%, +BD response  Cardiac tests Echo 03/01/11 >> mod LVH, EF 65 to [id number], grade 1 diastolic dysfx, mild LA dilation, mild RA/RV dilation  Past medical history HTN, PAD, HLD, A fib, CAD, Hypothyroidism, GERD, DM, Impaired memory  Past surgical history, Family history, Social history, Allergies reviewed  Vital signs BP 132/70 (BP Location: Right Arm, Cuff Size: Normal)   Pulse (!) 59   Ht 5\' 2"  (1.575 m)   Wt 203 lb 12.8 oz (92.4 kg)   SpO2 97%   BMI 37.28 kg/m    History of Present Illness: [name] is a 75 y.o. female smoker with COPD with asthma.  She is down to 2 cigarettes per week.  No longer taking bupropion.  Breathing better.  Gets occasional cough with activity.  Only using symbicort few times per week.  Gets globus sensation > worse when her reflux flares up.  Physical  Exam:  HEENT - No sinus tenderness, no oral exudate, no LAN  Cardiac - s1s2 regular, no murmur  Chest - no wheeze/ rales Abdomen - obese, soft, nontender  Extremities - no edema Neurologic - normals strength Skin - no rashes  Psychiatric - normal mood, behavior   Assessment/Plan:  COPD with asthma. - improved with reduction in cigarette use - continue prn symbicort and albuterol  Tobacco abuse. - encouraged her to keep up with her smoking cessation efforts  Laryngopharyngeal reflux. - advised her to avoid throat clearing  - she can sip water with urge to cough - salt water gargles ad lib prn - continue prilosec through PCP   Patient Instructions  Follow up in 1 year    Brittney Mires, MD [place] 11/07/2015, 11:11 AM Pager:  [phone]

## 2015-11-07 NOTE — Patient Instructions (Signed)
Follow up in 1 year.

## 2015-11-27 ENCOUNTER — Other Ambulatory Visit: Payer: Self-pay | Admitting: Pulmonary Disease

## 2015-12-26 ENCOUNTER — Other Ambulatory Visit: Payer: Self-pay | Admitting: Pulmonary Disease

## 2016-01-04 NOTE — Telephone Encounter (Signed)
Please confirm that she is still taking this.

## 2016-01-04 NOTE — Telephone Encounter (Signed)
Received a refill request for Zyban 150mg , filled for #60 with 3 refills. Last filled on 7.17.17 by [facility] while at [facility]. Pt last seen in 10/2015.   Dr. Halford Chessman please advise if ok to refill. Thanks.

## 2016-01-17 ENCOUNTER — Telehealth: Payer: Self-pay | Admitting: Pulmonary Disease

## 2016-01-17 MED ORDER — BUPROPION HCL ER (SR) 150 MG PO TB12: 150.0000 mg | ORAL_TABLET | Freq: Two times a day (BID) | ORAL | 2 refills | Status: DC

## 2016-01-17 NOTE — Telephone Encounter (Signed)
lmtcb X1 for pt's daughter Cecille Rubin (dpr on file).  Per last ov, pt was taken off wellbutrin, which was originally prescribed for smoking cessation.

## 2016-01-17 NOTE — Telephone Encounter (Signed)
Pt daughter returning call and can be reached @ [phone].Hillery Hunter

## 2016-01-17 NOTE — Telephone Encounter (Signed)
Rx sent to preferred pharmacy. Pt aware and voiced her understanding. Nothing further needed.  

## 2016-01-17 NOTE — Telephone Encounter (Signed)
Can send script for wellbutrin 150 mg bid, #60 with 2 refills.

## 2016-01-17 NOTE — Telephone Encounter (Signed)
Spoke with daughter Cecille Rubin, requesting wellbutrin refill.  I advised that per 10/2015 OV note pt had d/c'ed medication.  Pt's daughter states that this is incorrect and has been taking wellbutrin since before last OV.  VS ok to continue refilling wellbutrin?  This is not on current med list.  Thanks!

## 2016-02-15 ENCOUNTER — Telehealth (HOSPITAL_COMMUNITY): Payer: Self-pay | Admitting: Cardiovascular Disease

## 2016-02-15 DIAGNOSIS — Z79899 Other long term (current) drug therapy: Secondary | ICD-10-CM

## 2016-02-15 MED ORDER — APIXABAN 5 MG PO TABS: 5.0000 mg | ORAL_TABLET | Freq: Two times a day (BID) | ORAL | 5 refills | Status: DC

## 2016-02-15 NOTE — Telephone Encounter (Signed)
Spoke to daughter Cecille Rubin  Notes patient was on Eliquis last spring following hospitalization.  Something happened w her refills and this "fell through the cracks", notes she realized she has been off this medication for several months and is seeking advice as to whether patient needs to resume.  Currently patient is taking:   Cartia 120mg  DAILY Omeprazole 40mg  Welbutrin 150mg  Levothyroxine 0.100 mg Metoprolol 25mg  BID Aricept 10mg   Most current med list verified as correct. There was some initial concern that she wasn't taking the bb or diltiazem but daughter verified she is taking these, and she's administering correctly.  Seeking advice. She's been off Eliquis for several months and I don't see note indicating why or when this was discontinued - per instruction by Dr. Gwenlyn Found on 05/11/15 appt she was to continue this.  Likely, as determined by discussion w daughter, refills from hospital were not reauthorized/no notification received from pharmacy to refill. Of note, per medication review, she'd been on Xarelto in the past.  Daughter aware I'll verify OK to resume at prior dosing and that we may need to bring patient in for return cardiology visit. Further instructions to follow after discussion w pharmD.

## 2016-02-15 NOTE — Telephone Encounter (Signed)
New message  Pt c/o medication issue:  1. Name of Medication: Eliquis   2. How are you currently taking this medication (dosage and times per day)?   3. Are you having a reaction (difficulty breathing--STAT)? no  4. What is your medication issue? Pt daughter states pt was on eliquis but is not anymore and would like to know if she would need to get a new prescription filled. Please call back to discuss

## 2016-02-15 NOTE — Telephone Encounter (Signed)
Reviewed w Raquel RPH, unclear why discontinued but last reported as taking 08/01/15. Advised to resume at 5mg  BID dose as long as no S&S bleeding problems, etc that would've indicated reason to stop. Discussed w caller, OK to resume at BID dosing and will get this Rx at pharmacy today. She's aware of additional instructions to do f/u BMET in 1 week after restart of med, as we do not have recent BMET on file. Voiced understanding of instructions and aware to call if new concerns. Msg routed to Dr. Gwenlyn Found for his awareness.

## 2016-03-07 ENCOUNTER — Ambulatory Visit (INDEPENDENT_AMBULATORY_CARE_PROVIDER_SITE_OTHER): Payer: Medicare Other | Admitting: Cardiovascular Disease

## 2016-03-07 ENCOUNTER — Telehealth: Payer: Self-pay | Admitting: Cardiovascular Disease

## 2016-03-07 ENCOUNTER — Encounter: Payer: Self-pay | Admitting: Cardiovascular Disease

## 2016-03-07 VITALS — BP 133/68 | HR 72 | Ht 63.0 in | Wt 197.6 lb

## 2016-03-07 DIAGNOSIS — I10 Essential (primary) hypertension: Secondary | ICD-10-CM | POA: Diagnosis not present

## 2016-03-07 DIAGNOSIS — I480 Paroxysmal atrial fibrillation: Secondary | ICD-10-CM | POA: Diagnosis not present

## 2016-03-07 NOTE — Assessment & Plan Note (Signed)
History of paroxysmal atrial fibrillation maintaining sinus rhythm on Eliquis oral anticoagulation. 

## 2016-03-07 NOTE — Patient Instructions (Signed)
Medication Instructions: Your physician recommends that you continue on your current medications as directed. Please refer to the Current Medication list given to you today.   Labwork: I will request labs from Dr. Jonni Sanger.  Testing/Procedures: Schedule LEA and Carotid Dopplers to be done in May 2018  Follow-Up: Your physician wants you to follow-up in: 1 year with Dr. Gwenlyn Found. You will receive a reminder letter in the mail two months in advance. If you don't receive a letter, please call our office to schedule the follow-up appointment.  If you need a refill on your cardiac medications before your next appointment, please call your pharmacy.

## 2016-03-07 NOTE — Progress Notes (Signed)
03/07/2016 [name]   1940-01-24  [id number]  Primary Physician Leamon Arnt, MD Primary Cardiologist: Lorretta Harp MD Renae Gloss  HPI:  The patient is a 76 year old moderately overweight divorced Caucasian female, mother of 13, grandmother of 4 grandchildren, who I last saw in the office 05/11/15.She has a history of normal coronary arteries by catheterization, September 14, 2008, and normal LV systolic function with diastolic dysfunction. She has severe aortoiliac disease. I performed PTA and stenting of her right common iliac artery using a 10 x 4 SMART nitinol self-expanding stent. I attempted to recanalize her left common iliac artery unsuccessfully, and ultimately she underwent right-to-left femoral-femoral crossover grafting by Dr. Gae Gallop at my request. Her symptoms and ABIs have improved. Subsequently, it was determined in our lab that her femoral-femoral crossover graft had occluded, and on prior re-angiography she had in-stent restenosis, which I restented using an 8 x 38 iCAST covered stent with improvement in her Dopplers and ABIs. Her most recent Dopplers performed in October of 2014 revealed a right ABI 0.8 with a patent right common iliac artery stent, a left ABI of 0.49 with an occluded fem-fem crossover graft.She does complain of some dyspnea on exertion, which has not changed in frequency or severity. Her other problems include hypertension, hyperlipidemia, and non-insulin-requiring diabetes. Her last Doppler study performed in our office 05/31/15 revealed a right ABI 0. 47 with an occluded right external iliac, left ABI 0. 46. 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 She does have some lifestyle limiting claudication but denies chest pain or shortness of breath.   Current Outpatient Prescriptions  Medication Sig Dispense Refill  . albuterol (PROVENTIL  HFA;VENTOLIN HFA) 108 (90 Base) MCG/ACT inhaler Inhale into the lungs.    Marland Kitchen apixaban (ELIQUIS) 5 MG TABS tablet Take 1 tablet (5 mg total) by mouth 2 (two) times daily. 60 tablet 5  . buPROPion (WELLBUTRIN SR) 150 MG 12 hr tablet Take 1 tablet (150 mg total) by mouth 2 (two) times daily. 60 tablet 2  . diltiazem (CARDIZEM CD) 120 MG 24 hr capsule Take 1 capsule (120 mg total) by mouth daily. 30 capsule 1  . donepezil (ARICEPT) 10 MG tablet Take 1 tablet (10 mg total) by mouth at bedtime. 30 tablet 5  . levothyroxine (SYNTHROID, LEVOTHROID) 88 MCG tablet Take 88 mcg by mouth daily before breakfast.    . metoprolol tartrate (LOPRESSOR) 25 MG tablet Take 1 tablet (25 mg total) by mouth 2 (two) times daily. 60 tablet 1  . omeprazole (PRILOSEC) 40 MG capsule TAKE 1 CAPSULE(40 MG) BY MOUTH EVERY DAY BEFORE A MEAL    . SYMBICORT 160-4.5 MCG/ACT inhaler USE 2 PUFFS BY MOUTH TWICE DAILY 10.2 g 2  . vitamin B-12 (CYANOCOBALAMIN) 1000 MCG tablet Take 1,000 mcg by mouth daily.     No current facility-administered medications for this visit.     Allergies  Allergen Reactions  . Glucophage [Metformin Hydrochloride] Itching and Rash    REPORTED BY PATIENT; STATES SYMPTOMS APPEARED AFTER THIRD DOES WITHIN THE LAST MONTH.  Marland Kitchen Alendronate Sodium Other (See Comments)    Unknown   . Metformin And Related Other (See Comments)    unknown  . Omnipaque [Iohexol] Other (See Comments)    Unknown   . Valsartan Other (See Comments)    unknown  . Adhesive [Tape] Rash  . Amoxicillin Itching and Rash  . Avelox [  Moxifloxacin Hcl In Nacl] Itching and Rash  . Cephalexin Itching and Rash  . Contrast Media [Iodinated Diagnostic Agents] Itching and Rash  . Latex Rash  . Plavix [Clopidogrel Bisulfate] Itching and Rash    Social History   Social History  . Marital status: Divorced    Spouse name: N/A  . Number of children: 3  . Years of education: 48   Occupational History  . Retired Retired   Social History  Main Topics  . Smoking status: Current Every Day Smoker    Packs/day: 0.40    Years: 47.00    Types: Cigarettes    Last attempt to quit: 01/17/2010  . Smokeless tobacco: Never Used  . Alcohol use No  . Drug use: No  . Sexual activity: Not Currently   Other Topics Concern  . Not on file   Social History Narrative   Lives at home alone   Right-handed   Caffeine: 2 sodas per day     Review of Systems: General: negative for chills, fever, night sweats or weight changes.  Cardiovascular: negative for chest pain, dyspnea on exertion, edema, orthopnea, palpitations, paroxysmal nocturnal dyspnea or shortness of breath Dermatological: negative for rash Respiratory: negative for cough or wheezing Urologic: negative for hematuria Abdominal: negative for nausea, vomiting, diarrhea, bright red blood per rectum, melena, or hematemesis Neurologic: negative for visual changes, syncope, or dizziness All other systems reviewed and are otherwise negative except as noted above.    Blood pressure 133/68, pulse 72, height 5\' 3"  (1.6 m), weight 197 lb 9.6 oz (89.6 kg).  General appearance: alert and no distress Neck: no adenopathy, no JVD, supple, symmetrical, trachea midline, thyroid not enlarged, symmetric, no tenderness/mass/nodules and Soft right carotid bruit Lungs: clear to auscultation bilaterally Heart: regular rate and rhythm, S1, S2 normal, no murmur, click, rub or gallop Extremities: extremities normal, atraumatic, no cyanosis or edema  EKG sinus rhythm at 72 with nonspecific ST and T-wave changes. I personally reviewed this EKG  ASSESSMENT AND PLAN:   Essential hypertension History of hypertension with blood pressures measured today at 133/68. She is on diltiazem and metoprolol. Continue current meds at current dosing.  PVD WITH CLAUDICATION History of peripheral vascular disease status post stenting of her right common iliac artery August 2010 with failed attempt at left iliac  stenting. She also clearly underwent a right-to-left femorofemoral bypass graft by Dr. Scot Dock April 2012. I restented the right side with an eye Stent (8 x 38) in the past. Her most recent injury and performed via the radial approach 04/05/14 revealed an occluded distal abdominal aortogram and bilateral iliac arteries. She does have some lifestyle limiting claudication.  PAF (paroxysmal atrial fibrillation) (HCC) History of paroxysmal atrial fibrillation maintaining sinus rhythm on Eliquis  oral anticoagulation.      Lorretta Harp MD FACP,FACC,FAHA, [facility] 03/07/2016 3:23 PM

## 2016-03-07 NOTE — Telephone Encounter (Signed)
New message    Pt verbalized that she is calling for rn about mother ov today

## 2016-03-07 NOTE — Assessment & Plan Note (Signed)
History of hypertension with blood pressures measured today at 133/68. She is on diltiazem and metoprolol. Continue current meds at current dosing.

## 2016-03-07 NOTE — Telephone Encounter (Signed)
Returned call, no answer-lmtcb

## 2016-03-07 NOTE — Assessment & Plan Note (Signed)
History of peripheral vascular disease status post stenting of her right common iliac artery August 2010 with failed attempt at left iliac stenting. She also clearly underwent a right-to-left femorofemoral bypass graft by Dr. Scot Dock April 2012. I restented the right side with an eye Stent (8 x 38) in the past. Her most recent injury and performed via the radial approach 04/05/14 revealed an occluded distal abdominal aortogram and bilateral iliac arteries. She does have some lifestyle limiting claudication.

## 2016-03-08 ENCOUNTER — Other Ambulatory Visit: Payer: Self-pay | Admitting: Cardiovascular Disease

## 2016-03-08 NOTE — Telephone Encounter (Signed)
Called Dr. Tamela Oddi office to have labs done within the last year faxed here. They stated the last BMET she had done was in July 2017.   Spoke with Cecille Rubin, she is going to try to bring her mom here today to have the BMET done.

## 2016-03-08 NOTE — Telephone Encounter (Signed)
Follow up  Pt voiced returning nurses call-see below.  Please f/u

## 2016-03-08 NOTE — Telephone Encounter (Signed)
Returned call to patient's daughter Cecille Rubin Patient's mom was seen 2/21 & patient's daughter concerned that kidney function was not checked - she thought this was the main point of the appt.   Telephone note from 1/31 references that patient was to have a BMET 1 week after Eliquis 5mg  BID was initiated. This was not completed per our records, but AVS from 2/21 appt indicates that labs will be requested from PCP so unsure if PCP did BMET recently or not. Explained that patient should have had blood work done at her convenience 1 week from Jan 31 and that we do not draw labs or arrange appts for this.   Advised patient's daughter that I will send the message to Dr. Kennon Holter primary for follow up on labs from PCP but did inform her that it would be best to bring her in for BMET @ [facility]. Directions provided on where lab is located. She states she can most likely bring her in today.   Routed to [place], [place]

## 2016-03-09 LAB — BASIC METABOLIC PANEL
BUN: 16 mg/dL (ref 7–25)
CHLORIDE: 106 mmol/L (ref 98–110)
CO2: 21 mmol/L (ref 20–31)
Calcium: 9.7 mg/dL (ref 8.6–10.4)
Creat: 1.23 mg/dL — ABNORMAL HIGH (ref 0.60–0.93)
Glucose, Bld: 97 mg/dL (ref 65–99)
POTASSIUM: 4.3 mmol/L (ref 3.5–5.3)
SODIUM: 139 mmol/L (ref 135–146)

## 2016-03-26 ENCOUNTER — Ambulatory Visit: Payer: Medicare Other | Admitting: Adult Health

## 2016-03-27 ENCOUNTER — Other Ambulatory Visit: Payer: Self-pay | Admitting: Neurology

## 2016-03-29 ENCOUNTER — Ambulatory Visit: Payer: Medicare Other | Admitting: Adult Health

## 2016-03-30 ENCOUNTER — Encounter: Payer: Self-pay | Admitting: Adult Health

## 2016-04-02 ENCOUNTER — Encounter: Payer: Self-pay | Admitting: Adult Health

## 2016-04-02 ENCOUNTER — Ambulatory Visit (INDEPENDENT_AMBULATORY_CARE_PROVIDER_SITE_OTHER): Payer: Medicare Other | Admitting: Adult Health

## 2016-04-02 VITALS — BP 156/74 | HR 70 | Ht 63.0 in | Wt 198.0 lb

## 2016-04-02 DIAGNOSIS — R413 Other amnesia: Secondary | ICD-10-CM

## 2016-04-02 MED ORDER — MEMANTINE HCL 28 X 5 MG & 21 X 10 MG PO TABS: ORAL_TABLET | ORAL | 0 refills | Status: DC

## 2016-04-02 NOTE — Progress Notes (Signed)
I have read the note, and I agree with the clinical assessment and plan.  WILLIS,CHARLES KEITH   

## 2016-04-02 NOTE — Progress Notes (Signed)
PATIENT: [name] DOB: [birth year]  REASON FOR VISIT: follow up- memory HISTORY FROM: patient- daughter  HISTORY OF PRESENT ILLNESS: [name] is a 76 year old female with a history of memory disturbance. She returns today for lower. She reports that she is tolerating the increase in Aricept well. She is currently taking Aricept 10 mg at bedtime. She lives at home alone. She is able to complete all ADLs independently. She does operate a motor vehicle without difficulty. Although she states that she  only had one occasion that she got lost. She had to call her daughter to come help her. Denies any trouble preparing her meals. Denies any trouble sleeping. Denies hallucinations. Denies tremor. Her daughter now manages her finances, medications and appointments. She returns today for an evaluation.  HISTORY 09/27/15: [name] is a 76 year old right-handed white female with a history of a mild memory disturbance. The patient does operate a motor vehicle, but she no longer handles the finances, she allows the family to manage the medications and keep up with appointments. The patient comes in with her daughter today, they have not noted any significant change in her memory since last seen. She seems a little bit confused at times in the morning or late in the evening, but she does well at midday. The patient has not given up any activities of daily living since last seen. There have been no issues with getting lost while driving. The patient is on low-dose Aricept taking 5 mg at night, she is tolerating the medication well.  REVIEW OF SYSTEMS: Out of a complete 14 system review of symptoms, the patient complains only of the following symptoms, and all other reviewed systems are negative.  Back pain, walking difficulty, memory loss, cough, hearing loss  ALLERGIES: Allergies  Allergen Reactions  . Glucophage [Metformin Hydrochloride] Itching and Rash    REPORTED BY PATIENT; STATES SYMPTOMS  APPEARED AFTER THIRD DOES WITHIN THE LAST MONTH.  Marland Kitchen Alendronate Sodium Other (See Comments)    Unknown   . Metformin And Related Other (See Comments)    unknown  . Omnipaque [Iohexol] Other (See Comments)    Unknown   . Valsartan Other (See Comments)    unknown  . Adhesive [Tape] Rash  . Amoxicillin Itching and Rash  . Avelox [Moxifloxacin Hcl In Nacl] Itching and Rash  . Cephalexin Itching and Rash  . Contrast Media [Iodinated Diagnostic Agents] Itching and Rash  . Latex Rash  . Plavix [Clopidogrel Bisulfate] Itching and Rash    HOME MEDICATIONS: Outpatient Medications Prior to Visit  Medication Sig Dispense Refill  . albuterol (PROVENTIL HFA;VENTOLIN HFA) 108 (90 Base) MCG/ACT inhaler Inhale into the lungs.    Marland Kitchen apixaban (ELIQUIS) 5 MG TABS tablet Take 1 tablet (5 mg total) by mouth 2 (two) times daily. 60 tablet 5  . buPROPion (WELLBUTRIN SR) 150 MG 12 hr tablet Take 1 tablet (150 mg total) by mouth 2 (two) times daily. 60 tablet 2  . diltiazem (CARDIZEM CD) 120 MG 24 hr capsule Take 1 capsule (120 mg total) by mouth daily. 30 capsule 1  . donepezil (ARICEPT) 10 MG tablet Take 1 tablet (10 mg total) by mouth at bedtime. 30 tablet 5  . levothyroxine (SYNTHROID, LEVOTHROID) 88 MCG tablet Take 88 mcg by mouth daily before breakfast.    . metoprolol tartrate (LOPRESSOR) 25 MG tablet Take 1 tablet (25 mg total) by mouth 2 (two) times daily. 60 tablet 1  . omeprazole (PRILOSEC) 40 MG capsule TAKE  1 CAPSULE(40 MG) BY MOUTH EVERY DAY BEFORE A MEAL    . SYMBICORT 160-4.5 MCG/ACT inhaler USE 2 PUFFS BY MOUTH TWICE DAILY 10.2 g 2  . vitamin B-12 (CYANOCOBALAMIN) 1000 MCG tablet Take 1,000 mcg by mouth daily.     No facility-administered medications prior to visit.     PAST MEDICAL HISTORY: Past Medical History:  Diagnosis Date  . Arthritis   . Chronic obstructive asthma 05/28/2011  . Claudication ([place])    LEA DOPPLER, 12/10/2011 - FEMORAL-FEMORAL BYPASS GRAFT-occluded; LEFT  TIBIAL-appeared occluded;   . COPD (chronic obstructive pulmonary disease) ([place])   . Diabetes mellitus   . DOE (dyspnea on exertion)    NUCLEAR STRESS TEST, 08/03/2010 - EKG negative for ischemia  . GERD (gastroesophageal reflux disease)   . Hypercholesteremia   . Hypertension   . Hyperthyroidism   . Hypothyroidism   . Memory difficulties 04/19/2015  . Obesity   . PAD (peripheral artery disease) ([place])   . Shortness of breath   . SOB (shortness of breath)    2D ECHO, 03/01/2011 - EF 65-70%, moderate LVH,     PAST SURGICAL HISTORY: Past Surgical History:  Procedure Laterality Date  . ABDOMINAL ANGIOGRAM Left 12/12/2010   Procedure: ABDOMINAL ANGIOGRAM;  Surgeon: Lorretta Harp, MD;  Location: [facility] CATH LAB;  Service: Cardiovascular;  Laterality: Left;  . CARDIAC CATHETERIZATION  10/18/2008   No intervention  . CARDIAC CATHETERIZATION  09/14/2008   Continue medical therapy  . DESCENDING AORTIC ANEURYSM REPAIR W/ STENT     Stent repair  . FEMORAL-FEMORAL BYPASS GRAFT  05/09/2010   Right to Left Fem-Fem BPG by Dr. Scot Dock  . LOWER EXTREMITY ANGIOGRAM N/A 04/05/2014   Procedure: LOWER EXTREMITY ANGIOGRAM;  Surgeon: Lorretta Harp, MD;  Location: [facility] CATH LAB;  Service: Cardiovascular;  Laterality: N/A;  . PERCUTANEOUS STENT INTERVENTION  12/12/2010   Rt common iliac stenosis stented with a 8x68mm ICAST covered stent resulting in a reduction of a 50% in-stent restenosis to 0%  . PERCUTANEOUS STENT INTERVENTION  04/20/2010   Rt common iliac stenosis stented with a 8x18 Genesis OPTA stent balloon resulting in reduciton of a 70% stenosis to 0% residual  . PERCUTANEOUS STENT INTERVENTION  10/18/2008   No intervention  . PERCUTANEOUS STENT INTERVENTION  09/14/2008   Rt common iliac stenosis stented with a 4x10 Cordis stent resulting in a reduction of 99% stenosis to less than 20% residual  . PERCUTANEOUS STENT INTERVENTION Right 12/12/2010   Procedure: PERCUTANEOUS STENT INTERVENTION;  Surgeon:  Lorretta Harp, MD;  Location: [facility] CATH LAB;  Service: Cardiovascular;  Laterality: Right;  . TUBAL LIGATION      FAMILY HISTORY: Family History  Problem Relation Age of Onset  . Diabetes Mother   . Heart disease Mother   . Hyperlipidemia Mother   . Cancer Sister   . Allergies Daughter     x3    SOCIAL HISTORY: Social History   Social History  . Marital status: Divorced    Spouse name: N/A  . Number of children: 3  . Years of education: 44   Occupational History  . Retired Retired   Social History Main Topics  . Smoking status: Current Every Day Smoker    Packs/day: 0.40    Years: 47.00    Types: Cigarettes    Last attempt to quit: 01/17/2010  . Smokeless tobacco: Never Used  . Alcohol use No  . Drug use: No  . Sexual activity: Not Currently  Other Topics Concern  . Not on file   Social History Narrative   Lives at home alone   Right-handed   Caffeine: 2 sodas per day      PHYSICAL EXAM  Vitals:   04/02/16 1135  Weight: 198 lb (89.8 kg)  Height: 5\' 3"  (1.6 m)   Body mass index is 35.07 kg/m.   MMSE - Mini Mental State Exam 04/02/2016 09/27/2015 04/19/2015  Orientation to time 2 3 5   Orientation to Place 5 5 5   Registration 3 3 3   Attention/ Calculation 2 5 5   Recall 2 2 1   Language- name 2 objects 2 2 2   Language- repeat 1 1 1   Language- follow 3 step command 3 2 3   Language- read & follow direction 1 1 1   Write a sentence 1 1 1   Copy design 1 1 1   Total score 23 26 28     Generalized: Well developed, in no acute distress   Neurological examination  Mentation: Alert. Follows all commands speech and language fluent Cranial nerve II-XII: Pupils were equal round reactive to light. Extraocular movements were full, visual field were full on confrontational test. Facial sensation and strength were normal. Uvula tongue midline. Head turning and shoulder shrug  were normal and symmetric. Motor: The motor testing reveals 5 over 5 strength of all 4  extremities. Good symmetric motor tone is noted throughout.  Sensory: Sensory testing is intact to soft touch on all 4 extremities. No evidence of extinction is noted.  Coordination: Cerebellar testing reveals good finger-nose-finger and heel-to-shin bilaterally.  Gait and station: Gait is normal. Tandem gait is normal. Romberg is negative. No drift is seen.  Reflexes: Deep tendon reflexes are symmetric and normal bilaterally.   DIAGNOSTIC DATA (LABS, IMAGING, TESTING) - I reviewed patient records, labs, notes, testing and imaging myself where available.  Lab Results  Component Value Date   WBC 6.2 04/10/2015   HGB 12.8 04/10/2015   HCT 39.9 04/10/2015   MCV 77.9 (L) 04/10/2015   PLT 215 04/10/2015      Component Value Date/Time   NA 139 03/08/2016 1517   K 4.3 03/08/2016 1517   CL 106 03/08/2016 1517   CO2 21 03/08/2016 1517   GLUCOSE 97 03/08/2016 1517   BUN 16 03/08/2016 1517   CREATININE 1.23 (H) 03/08/2016 1517   CALCIUM 9.7 03/08/2016 1517   PROT 6.5 03/23/2014 1643   ALBUMIN 3.7 03/23/2014 1643   AST 19 03/23/2014 1643   ALT 16 03/23/2014 1643   ALKPHOS 60 03/23/2014 1643   BILITOT 0.3 03/23/2014 1643   GFRNONAA 58 (L) 04/14/2015 0457   GFRAA >60 04/14/2015 0457   Lab Results  Component Value Date   HGBA1C 5.7 (H) 04/10/2015   Lab Results  Component Value Date   TSH 2.830 03/23/2014      ASSESSMENT AND PLAN 76 y.o. year old female  has a past medical history of Arthritis; Chronic obstructive asthma (05/28/2011); Claudication [facility]); COPD (chronic obstructive pulmonary disease) ([place]); Diabetes mellitus; DOE (dyspnea on exertion); GERD (gastroesophageal reflux disease); Hypercholesteremia; Hypertension; Hyperthyroidism; Hypothyroidism; Memory difficulties (04/19/2015); Obesity; PAD (peripheral artery disease) ([place]); Shortness of breath; and SOB (shortness of breath). here with:  1. Memory disturbance  Patient's memory score has declined. MMSE today is 23/30 was  previously 26/30. She will continue on Aricept 10 mg at bedtime. I will add Namenda. The patient denies any kidney abnormalities. She will begin with a titration pack. I have reviewed the titration with the patient  and her daughter. At the end of week 3 if the patient is tolerating the medication well, her daughter was instructed to call for the maintenance prescription which is Namenda 10 mg twice a day. Patient and her daughter verbalized understanding. If her symptoms worsen or she develops new symptoms she was advised to call our office. She will follow-up in 6 months or sooner if needed.     Ward Givens, MSN, NP-C 04/02/2016, 11:43 AM [facility] [address], [place], [place] [address] [phone]

## 2016-04-02 NOTE — Patient Instructions (Addendum)
Memory score slightly declined Continue Aricept Start Namenda titration pack- follow directions on packAt the end of week three if she is tolerating the medication well please call and I will send in maintenance dose with is Namenda 10 mg twice a day.  Memantine extended release capsules What is this medicine? MEMANTINE (MEM an teen) is used to treat dementia caused by Alzheimer's disease. This medicine may be used for other purposes; ask your health care provider or pharmacist if you have questions. COMMON BRAND NAME(S): Namenda XR What should I tell my health care provider before I take this medicine? They need to know if you have any of these conditions: -difficulty passing urine -kidney disease -liver disease -seizures -an unusual or allergic reaction to memantine, other medicines, foods, dyes, or preservatives -pregnant or trying to get pregnant -breast-feeding How should I use this medicine? Take this medicine by mouth with a glass of water. Follow the directions on the prescription label. You may take this medicine with or without food. You may swallow the capsules whole or open them and sprinkle the entire contents on applesauce before swallowing. Other than sprinkling the medicine on applesauce, the capsules should be swallowed whole and not divided, chewed, or crushed. Take your doses at regular intervals. Do not take your medicine more often than directed. Continue to take your medicine even if you feel better. Do not stop taking except on the advice of your doctor or health care professional. Talk to your pediatrician regarding the use of this medicine in children. Special care may be needed. Overdosage: If you think you have taken too much of this medicine contact a poison control center or emergency room at once. NOTE: This medicine is only for you. Do not share this medicine with others. What if I miss a dose? If you miss a dose, take it as soon as you can. If it is almost time  for your next dose, take only that dose. Do not take double or extra doses. If you do not take your medicine for several days, contact your health care provider. Your dose may need to be changed. What may interact with this medicine? -acetazolamide -amantadine -cimetidine -dextromethorphan -dofetilide -hydrochlorothiazide -ketamine -metformin -methazolamide -quinidine -ranitidine -sodium bicarbonate -triamterene This list may not describe all possible interactions. Give your health care provider a list of all the medicines, herbs, non-prescription drugs, or dietary supplements you use. Also tell them if you smoke, drink alcohol, or use illegal drugs. Some items may interact with your medicine. What should I watch for while using this medicine? Visit your doctor or health care professional for regular checks on your progress. Check with your doctor or health care professional if there is no improvement in your symptoms or if they get worse. You may get drowsy or dizzy. Do not drive, use machinery, or do anything that needs mental alertness until you know how this drug affects you. Do not stand or sit up quickly, especially if you are an older patient. This reduces the risk of dizzy or fainting spells. Alcohol can make you more drowsy and dizzy. Avoid alcoholic drinks. What side effects may I notice from receiving this medicine? Side effects that you should report to your doctor or health care professional as soon as possible: -agitation or a feeling of restlessness -allergic reactions like skin rash, itching or hives, swelling of the face, lips, or tongue -depressed mood -dizziness -hallucinations -redness, blistering, peeling or loosening of the skin, including inside the mouth -seizures -vomiting Side  effects that usually do not require medical attention (report to your doctor or health care professional if they continue or are  bothersome): -constipation -diarrhea -headache -nausea -trouble sleeping This list may not describe all possible side effects. Call your doctor for medical advice about side effects. You may report side effects to FDA at 1-800-FDA-1088. Where should I keep my medicine? Keep out of the reach of children. Store at room temperature between 15 degrees and 30 degrees C (59 degrees and 86 degrees F). Throw away any unused medicine after the expiration date. NOTE: This sheet is a summary. It may not cover all possible information. If you have questions about this medicine, talk to your doctor, pharmacist, or health care provider.  2018 Elsevier/Gold Standard (2015-02-03 10:09:47)

## 2016-04-13 ENCOUNTER — Telehealth: Payer: Self-pay | Admitting: *Deleted

## 2016-04-13 NOTE — Telephone Encounter (Signed)
I called and spoke to Potter , [place], Web designer, about [employer].   I relayed that can use memantine titration  (place instructions with 5 mg tablets ).   Week one 5mg  daily, Week 2 - 5mg  po bid, Week 3-  5mg  po AM 10mg  po PM, Week 4 - 10mg  po bid.  Call when on 10mg  po bid to get prescription for 10mg  tabs. I spoke to [place], daughter and let her know this.  She stated she did her meds for her mom..  She verbalized understanding of instructions.

## 2016-04-14 ENCOUNTER — Other Ambulatory Visit: Payer: Self-pay | Admitting: Pulmonary Disease

## 2016-04-16 ENCOUNTER — Telehealth: Payer: Self-pay

## 2016-04-16 NOTE — Telephone Encounter (Signed)
Pt is requesting Wellbutrin 150mg  bid refill for 60 tablets with 0 refills. Per OV on 11/07/15 patient is no longer taking this medication. Pt was provided a script on 01/17/16 for 60 with 2 refills. Please advise if you would like to refill medication. Thank you!

## 2016-04-22 NOTE — Telephone Encounter (Signed)
Okay to send refill for now.  Please confirm whether she is using wellbutrin for smoking cessation efforts, or for treatment of anxiety/depression.  If she is using for treatment of anxiety/depression, then she would need to discuss with her PCP about continuing future refills.

## 2016-04-23 MED ORDER — BUPROPION HCL ER (SR) 150 MG PO TB12: 150.0000 mg | ORAL_TABLET | Freq: Two times a day (BID) | ORAL | 0 refills | Status: DC

## 2016-04-23 NOTE — Telephone Encounter (Signed)
Spoke with pt, who states she is taken wellbutrin for smoking cessation. Rx has been sent to preferred pharmacy. Pt aware and voiced her understanding. Nothing further needed

## 2016-04-25 ENCOUNTER — Other Ambulatory Visit: Payer: Self-pay | Admitting: Pulmonary Disease

## 2016-05-07 ENCOUNTER — Other Ambulatory Visit: Payer: Self-pay | Admitting: Neurology

## 2016-05-11 ENCOUNTER — Telehealth: Payer: Self-pay | Admitting: *Deleted

## 2016-05-11 ENCOUNTER — Other Ambulatory Visit: Payer: Self-pay | Admitting: Adult Health

## 2016-05-11 MED ORDER — MEMANTINE HCL 10 MG PO TABS: 10.0000 mg | ORAL_TABLET | Freq: Two times a day (BID) | ORAL | 1 refills | Status: DC

## 2016-05-11 NOTE — Telephone Encounter (Signed)
Pt. has completed Namenda titration pack. Maintenance dose rx. escribed/fim

## 2016-05-23 ENCOUNTER — Inpatient Hospital Stay (HOSPITAL_COMMUNITY): Admission: RE | Admit: 2016-05-23 | Payer: Medicare Other | Source: Ambulatory Visit

## 2016-08-19 ENCOUNTER — Other Ambulatory Visit (HOSPITAL_COMMUNITY): Payer: Self-pay | Admitting: Cardiovascular Disease

## 2016-09-13 ENCOUNTER — Other Ambulatory Visit: Payer: Self-pay | Admitting: Family Medicine

## 2016-09-13 DIAGNOSIS — Z1231 Encounter for screening mammogram for malignant neoplasm of breast: Secondary | ICD-10-CM

## 2016-09-25 ENCOUNTER — Ambulatory Visit: Payer: Self-pay

## 2016-10-01 ENCOUNTER — Other Ambulatory Visit: Payer: Self-pay | Admitting: Neurology

## 2016-10-02 ENCOUNTER — Ambulatory Visit
Admission: RE | Admit: 2016-10-02 | Discharge: 2016-10-02 | Disposition: A | Discharge status: Home / Self Care | Payer: Medicare Other | Source: Ambulatory Visit | Attending: Family Medicine | Admitting: Family Medicine

## 2016-10-02 DIAGNOSIS — Z1231 Encounter for screening mammogram for malignant neoplasm of breast: Secondary | ICD-10-CM

## 2016-10-03 ENCOUNTER — Encounter: Payer: Self-pay | Admitting: Adult Health

## 2016-10-03 ENCOUNTER — Ambulatory Visit (INDEPENDENT_AMBULATORY_CARE_PROVIDER_SITE_OTHER): Payer: Medicare Other | Admitting: Adult Health

## 2016-10-03 VITALS — BP 165/76 | HR 58 | Wt 177.0 lb

## 2016-10-03 DIAGNOSIS — R413 Other amnesia: Secondary | ICD-10-CM | POA: Diagnosis not present

## 2016-10-03 MED ORDER — DONEPEZIL HCL 5 MG PO TABS: 5.0000 mg | ORAL_TABLET | Freq: Every day | ORAL | 3 refills | Status: DC

## 2016-10-03 NOTE — Progress Notes (Signed)
PATIENT: [name] DOB: Mar 31, 1940  REASON FOR VISIT: follow up HISTORY FROM: patient  HISTORY OF PRESENT ILLNESS: Today 10/03/16 [name] is a 76 year old female with a history of memory disturbance. She returns today for follow-up. She is here today with her daughter. Her daughter reports that she has been forgetting to take her medications. She states that she fills her pill box at the beginning of the week but the patient takes her pills out and put them in a pill bottle versus taking them. The patient lives at home alone. She is able to complete most ADLs independently. She does operate a Teacher, music. The daughter reports that approximately one year ago she did get lost driving to a familiar location. Reports that she returned 3 hours later. Patient denies any trouble sleeping. Reports that she is eating okay however the daughter disagrees. Reports that she is eating very little and has lost weight. Daughter also reports that the patient has been smoking cigarettes. It appears since the last visit she has loss approximately 20 pounds. Daughter also reports that she's been having diarrhea that has been ongoing for quite some time. She does feel that it may have started when Aricept was increased to 10 mg. Patient returns today for an evaluation.  HISTORY 04/02/16: [name] is a 76 year old female with a history of memory disturbance. She returns today for lower. She reports that she is tolerating the increase in Aricept well. She is currently taking Aricept 10 mg at bedtime. She lives at home alone. She is able to complete all ADLs independently. She does operate a motor vehicle without difficulty. Although she states that she  only had one occasion that she got lost. She had to call her daughter to come help her. Denies any trouble preparing her meals. Denies any trouble sleeping. Denies hallucinations. Denies tremor. Her daughter now manages her finances, medications and appointments.  She returns today for an evaluation.   REVIEW OF SYSTEMS: Out of a complete 14 system review of symptoms, the patient complains only of the following symptoms, and all other reviewed systems are negative.  See HPI  ALLERGIES: Allergies  Allergen Reactions  . Glucophage [Metformin Hydrochloride] Itching and Rash    REPORTED BY PATIENT; STATES SYMPTOMS APPEARED AFTER THIRD DOES WITHIN THE LAST MONTH.  Marland Kitchen Alendronate Sodium Other (See Comments)    Unknown   . Metformin And Related Other (See Comments)    unknown  . Omnipaque [Iohexol] Other (See Comments)    Unknown   . Valsartan Other (See Comments)    unknown  . Adhesive [Tape] Rash  . Amoxicillin Itching and Rash  . Avelox [Moxifloxacin Hcl In Nacl] Itching and Rash  . Cephalexin Itching and Rash  . Contrast Media [Iodinated Diagnostic Agents] Itching and Rash  . Latex Rash  . Plavix [Clopidogrel Bisulfate] Itching and Rash    HOME MEDICATIONS: Outpatient Medications Prior to Visit  Medication Sig Dispense Refill  . albuterol (PROVENTIL HFA;VENTOLIN HFA) 108 (90 Base) MCG/ACT inhaler Inhale into the lungs.    . donepezil (ARICEPT) 10 MG tablet TAKE 1 TABLET(10 MG) BY MOUTH AT BEDTIME 90 tablet 1  . ELIQUIS 5 MG TABS tablet TAKE 1 TABLET(5 MG) BY MOUTH TWICE DAILY 60 tablet 5  . memantine (NAMENDA) 10 MG tablet Take 1 tablet (10 mg total) by mouth 2 (two) times daily. 180 tablet 1  . metoprolol tartrate (LOPRESSOR) 25 MG tablet Take 1 tablet (25 mg total) by mouth 2 (two)  times daily. 60 tablet 1  . omeprazole (PRILOSEC) 40 MG capsule TAKE 1 CAPSULE(40 MG) BY MOUTH EVERY DAY BEFORE A MEAL    . SYMBICORT 160-4.5 MCG/ACT inhaler USE 2 PUFFS BY MOUTH TWICE DAILY 10.2 g 2  . vitamin B-12 (CYANOCOBALAMIN) 1000 MCG tablet Take 1,000 mcg by mouth daily.    Marland Kitchen levothyroxine (SYNTHROID, LEVOTHROID) 88 MCG tablet Take 88 mcg by mouth daily before breakfast.    . buPROPion (WELLBUTRIN SR) 150 MG 12 hr tablet Take 1 tablet (150 mg total)  by mouth 2 (two) times daily. (Patient not taking: Reported on 10/03/2016) 60 tablet 0  . diltiazem (CARDIZEM CD) 120 MG 24 hr capsule Take 1 capsule (120 mg total) by mouth daily. 30 capsule 1   No facility-administered medications prior to visit.     PAST MEDICAL HISTORY: Past Medical History:  Diagnosis Date  . Arthritis   . Chronic obstructive asthma 05/28/2011  . Claudication ([place])    LEA DOPPLER, 12/10/2011 - FEMORAL-FEMORAL BYPASS GRAFT-occluded; LEFT TIBIAL-appeared occluded;   . COPD (chronic obstructive pulmonary disease) ([place])   . Diabetes mellitus   . DOE (dyspnea on exertion)    NUCLEAR STRESS TEST, 08/03/2010 - EKG negative for ischemia  . GERD (gastroesophageal reflux disease)   . Hypercholesteremia   . Hypertension   . Hyperthyroidism   . Hypothyroidism   . Memory difficulties 04/19/2015  . Obesity   . PAD (peripheral artery disease) ([place])   . Shortness of breath   . SOB (shortness of breath)    2D ECHO, 03/01/2011 - EF 65-70%, moderate LVH,     PAST SURGICAL HISTORY: Past Surgical History:  Procedure Laterality Date  . ABDOMINAL ANGIOGRAM Left 12/12/2010   Procedure: ABDOMINAL ANGIOGRAM;  Surgeon: Lorretta Harp, MD;  Location: [facility] CATH LAB;  Service: Cardiovascular;  Laterality: Left;  . CARDIAC CATHETERIZATION  10/18/2008   No intervention  . CARDIAC CATHETERIZATION  09/14/2008   Continue medical therapy  . DESCENDING AORTIC ANEURYSM REPAIR W/ STENT     Stent repair  . FEMORAL-FEMORAL BYPASS GRAFT  05/09/2010   Right to Left Fem-Fem BPG by Dr. Scot Dock  . LOWER EXTREMITY ANGIOGRAM N/A 04/05/2014   Procedure: LOWER EXTREMITY ANGIOGRAM;  Surgeon: Lorretta Harp, MD;  Location: [facility] CATH LAB;  Service: Cardiovascular;  Laterality: N/A;  . PERCUTANEOUS STENT INTERVENTION  12/12/2010   Rt common iliac stenosis stented with a 8x47mm ICAST covered stent resulting in a reduction of a 50% in-stent restenosis to 0%  . PERCUTANEOUS STENT INTERVENTION  04/20/2010   Rt  common iliac stenosis stented with a 8x18 Genesis OPTA stent balloon resulting in reduciton of a 70% stenosis to 0% residual  . PERCUTANEOUS STENT INTERVENTION  10/18/2008   No intervention  . PERCUTANEOUS STENT INTERVENTION  09/14/2008   Rt common iliac stenosis stented with a 4x10 Cordis stent resulting in a reduction of 99% stenosis to less than 20% residual  . PERCUTANEOUS STENT INTERVENTION Right 12/12/2010   Procedure: PERCUTANEOUS STENT INTERVENTION;  Surgeon: Lorretta Harp, MD;  Location: [facility] CATH LAB;  Service: Cardiovascular;  Laterality: Right;  . TUBAL LIGATION      FAMILY HISTORY: Family History  Problem Relation Age of Onset  . Diabetes Mother   . Heart disease Mother   . Hyperlipidemia Mother   . Cancer Sister   . Allergies Daughter        x3  . Breast cancer Neg Hx     SOCIAL HISTORY: Social History  Social History  . Marital status: Divorced    Spouse name: N/A  . Number of children: 3  . Years of education: 64   Occupational History  . Retired Retired   Social History Main Topics  . Smoking status: Current Every Day Smoker    Packs/day: 1.00    Years: 47.00    Types: Cigarettes    Last attempt to quit: 01/17/2010  . Smokeless tobacco: Never Used     Comment: 10/03/16 1 PPD  . Alcohol use No  . Drug use: No  . Sexual activity: Not Currently   Other Topics Concern  . Not on file   Social History Narrative   Lives at home alone   Right-handed   Caffeine: 2 sodas per day      PHYSICAL EXAM  Vitals:   10/03/16 1055  BP: (!) 165/76  Pulse: (!) 58  Weight: 177 lb (80.3 kg)   Body mass index is 31.35 kg/m.   MMSE - Mini Mental State Exam 10/03/2016 04/02/2016 09/27/2015  Orientation to time 1 2 3   Orientation to Place 4 5 5   Registration 3 3 3   Attention/ Calculation 4 2 5   Recall 1 2 2   Language- name 2 objects 2 2 2   Language- repeat 0 1 1  Language- follow 3 step command 3 3 2   Language- read & follow direction 1 1 1   Write a  sentence 1 1 1   Copy design 1 1 1   Total score 21 23 26      Generalized: Well developed, in no acute distress   Neurological examination  Mentation: Alert. Follows all commands speech and language fluent Cranial nerve II-XII: Pupils were equal round reactive to light. Extraocular movements were full, visual field were full on confrontational test. Facial sensation and strength were normal. Uvula tongue midline. Head turning and shoulder shrug  were normal and symmetric. Motor: The motor testing reveals 5 over 5 strength of all 4 extremities. Good symmetric motor tone is noted throughout.  Sensory: Sensory testing is intact to soft touch on all 4 extremities. No evidence of extinction is noted.  Coordination: Cerebellar testing reveals good finger-nose-finger and heel-to-shin bilaterally.  Gait and station: Gait is normal. Tandem gait is slightly unsteady. Romberg is negative. No drift is seen.  Reflexes: Deep tendon reflexes are symmetric and normal bilaterally.   DIAGNOSTIC DATA (LABS, IMAGING, TESTING) - I reviewed patient records, labs, notes, testing and imaging myself where available.  Lab Results  Component Value Date   WBC 6.2 04/10/2015   HGB 12.8 04/10/2015   HCT 39.9 04/10/2015   MCV 77.9 (L) 04/10/2015   PLT 215 04/10/2015      Component Value Date/Time   NA 139 03/08/2016 1517   K 4.3 03/08/2016 1517   CL 106 03/08/2016 1517   CO2 21 03/08/2016 1517   GLUCOSE 97 03/08/2016 1517   BUN 16 03/08/2016 1517   CREATININE 1.23 (H) 03/08/2016 1517   CALCIUM 9.7 03/08/2016 1517   PROT 6.5 03/23/2014 1643   ALBUMIN 3.7 03/23/2014 1643   AST 19 03/23/2014 1643   ALT 16 03/23/2014 1643   ALKPHOS 60 03/23/2014 1643   BILITOT 0.3 03/23/2014 1643   GFRNONAA 58 (L) 04/14/2015 0457   GFRAA >60 04/14/2015 0457    Lab Results  Component Value Date   HGBA1C 5.7 (H) 04/10/2015    Lab Results  Component Value Date   TSH 2.830 03/23/2014      ASSESSMENT AND PLAN 75  y.o. year old female  has a past medical history of Arthritis; Chronic obstructive asthma (05/28/2011); Claudication [facility]); COPD (chronic obstructive pulmonary disease) ([place]); Diabetes mellitus; DOE (dyspnea on exertion); GERD (gastroesophageal reflux disease); Hypercholesteremia; Hypertension; Hyperthyroidism; Hypothyroidism; Memory difficulties (04/19/2015); Obesity; PAD (peripheral artery disease) ([place]); Shortness of breath; and SOB (shortness of breath). here with:  1. Memory disturbance   The patient's memory score has declined. She will continue on Namenda. I recommended decreasing Aricept 5 mg at bedtime due to weight loss and ongoing diarrhea. If it does not improve with the decrease in Aricept we may have to discontinue this medication altogether. I do believe that the patient is requiring more assistance and for that reason she should consider possible resources such as an adult day center or assisted living facility. Daughter agrees with these options. I have given them the number to care patrol to use as a possible resource in helping them with different options. Due to the patient's declining memory score also feel that her driving should be scrutinized or stopped altogether. Patient and daughter verbalized understanding. Patient will follow-up in 6 months or sooner if needed.  I spent 25 minutes with the patient. 50% of this time was spent counseling the patient on possible resources for additional assistance.     Ward Givens, MSN, NP-C 10/03/2016, 11:06 AM [facility] [address], [place], [place] [address] [phone]

## 2016-10-03 NOTE — Patient Instructions (Signed)
Your Plan:  Memory score has declined. Continue Namenda Decrease Aricept 5 mg at bedtime. We will need to continue to monitor her weight as well as diarrhea. At this time driving should be scrutinized Also feel you would benefit from more assistance. Please consider calling [employer] [phone] for options.  If your symptoms worsen or you develop new symptoms please let us know.   Thank you for coming to see [place] at [facility]. I hope we have been able to provide you high quality care today.  You may receive a patient satisfaction survey over the next few weeks. We would appreciate your feedback and comments so that we may continue to improve ourselves and the health of our patients.

## 2016-10-03 NOTE — Progress Notes (Signed)
I have read the note, and I agree with the clinical assessment and plan.  [name]   

## 2016-11-02 ENCOUNTER — Encounter: Payer: Self-pay | Admitting: Pulmonary Disease

## 2016-11-02 ENCOUNTER — Ambulatory Visit (INDEPENDENT_AMBULATORY_CARE_PROVIDER_SITE_OTHER): Payer: Medicare Other | Admitting: Pulmonary Disease

## 2016-11-02 VITALS — BP 124/80 | HR 74 | Ht 63.0 in | Wt 172.2 lb

## 2016-11-02 DIAGNOSIS — J449 Chronic obstructive pulmonary disease, unspecified: Secondary | ICD-10-CM

## 2016-11-02 DIAGNOSIS — J9611 Chronic respiratory failure with hypoxia: Secondary | ICD-10-CM

## 2016-11-02 DIAGNOSIS — Z720 Tobacco use: Secondary | ICD-10-CM

## 2016-11-02 MED ORDER — BUPROPION HCL ER (SMOKING DET) 150 MG PO TB12: 150.0000 mg | ORAL_TABLET | Freq: Two times a day (BID) | ORAL | 3 refills | Status: DC

## 2016-11-02 NOTE — Patient Instructions (Signed)
Bupropion (wellbutrin) 150 mg pill >> 1 pill daily for 3 days, then 1 pill twice per day.  Set quit date for 1 week after starting medicine.  Will arrange for overnight oxygen test  Follow up in 3 months

## 2016-11-02 NOTE — Progress Notes (Signed)
Current Outpatient Prescriptions on File Prior to Visit  Medication Sig  . albuterol (PROVENTIL HFA;VENTOLIN HFA) 108 (90 Base) MCG/ACT inhaler Inhale into the lungs.  Marland Kitchen diltiazem (CARTIA XT) 120 MG 24 hr capsule 120 mg of amoxicillin daily.  Marland Kitchen donepezil (ARICEPT) 5 MG tablet Take 1 tablet (5 mg total) by mouth at bedtime.  Marland Kitchen ELIQUIS 5 MG TABS tablet TAKE 1 TABLET(5 MG) BY MOUTH TWICE DAILY  . glimepiride (AMARYL) 1 MG tablet 1 mg daily.  Marland Kitchen levothyroxine (SYNTHROID, LEVOTHROID) 100 MCG tablet Take 100 mcg by mouth daily before breakfast.  . memantine (NAMENDA) 10 MG tablet Take 1 tablet (10 mg total) by mouth 2 (two) times daily.  . metoprolol tartrate (LOPRESSOR) 25 MG tablet Take 1 tablet (25 mg total) by mouth 2 (two) times daily.  Marland Kitchen omeprazole (PRILOSEC) 40 MG capsule TAKE 1 CAPSULE(40 MG) BY MOUTH EVERY DAY BEFORE A MEAL  . SYMBICORT 160-4.5 MCG/ACT inhaler USE 2 PUFFS BY MOUTH TWICE DAILY  . vitamin B-12 (CYANOCOBALAMIN) 1000 MCG tablet Take 1,000 mcg by mouth daily.   No current facility-administered medications on file prior to visit.     Chief Complaint  Patient presents with  . Follow-up    Pt is doing well overall. Pt has O2 at 2 liters with exertion, pt doesn't use O2 con't only as needed. DME-[facility]    Pulmonary tests V/Q scan 05/15/10 >> very low probabilty for pulmonary embolism PFT 05/28/11 >> FEV1 1.01 (54%), FEV1% 73, TLC 4.44 (100%), RV 2.74 (156%), DLCO 49%, +BD response  Cardiac tests Echo 03/01/11 >> mod LVH, EF 65 to 76%, grade 1 diastolic dysfx, mild LA dilation, mild RA/RV dilation  Past medical history HTN, PAD, HLD, A fib, CAD, Hypothyroidism, GERD, DM, Impaired memory  Past surgical history, Family history, Social history, Allergies reviewed  Vital signs BP 124/80 (BP Location: Left Arm, Cuff Size: Normal)   Pulse 74   Ht 5\' 3"  (1.6 m)   Wt 172 lb 3.2 oz (78.1 kg)   SpO2 93%   BMI 30.50 kg/m    History of Present Illness: [name] is a 76  y.o. female smoker with COPD with asthma.  She is now smoking 1/2 ppd.  Has intermittent cough with clear sputum.  Not having wheeze, fever, chest pain, swelling, or hemoptysis.  Uses albuterol few times per week.  Has 2 liters oxygen at home.  Uses intermittently when she thinks about it.  Physical Exam:  General - pleasant Eyes - pupils reactive ENT - no sinus tenderness, no oral exudate, no LAN Cardiac - regular, no murmur Chest - no wheeze, rales Abd - soft, non tender Ext - no edema Skin - no rashes Neuro - normal strength Psych - normal mood  Assessment/Plan:  COPD with asthma. - continue prn albuterol - discussed how cigarettes are contributing to her symptoms - she will check with her PCP about whether she got a flu shot this year  Tobacco abuse. - will have her use bupropion again >> discussed dosing schedule, setting quit date, and side effects to monitor for - advised she could also use nicotine replacement  Chronic respiratory failure with hypoxia. - discussed importance of using O2 as prescribed - will try to arrange for portable pulse oximeter - will arrange for ONO with RA and determine if she needs to use oxygen at night - continue 2 liters oxygen with exertion   Patient Instructions  Bupropion (wellbutrin) 150 mg pill >> 1 pill daily for  3 days, then 1 pill twice per day.  Set quit date for 1 week after starting medicine.  Will arrange for overnight oxygen test  Follow up in 3 months    Chesley Mires, MD [place] [facility] 11/02/2016, 12:26 PM Pager:  [phone]

## 2016-11-06 ENCOUNTER — Other Ambulatory Visit: Payer: Self-pay | Admitting: Neurology

## 2016-11-12 ENCOUNTER — Other Ambulatory Visit: Payer: Self-pay | Admitting: Adult Health

## 2017-01-31 ENCOUNTER — Telehealth: Payer: Self-pay | Admitting: *Deleted

## 2017-01-31 NOTE — Telephone Encounter (Signed)
Left detailed message samples at the front desk-hours 7-5

## 2017-01-31 NOTE — Telephone Encounter (Signed)
Aspirin is not adequate. The patient can come in for Eliquis samples until she can refill her prescription.

## 2017-01-31 NOTE — Telephone Encounter (Signed)
Patient's daughter calling because her mother (patient) has misplaced her Eliquis and is unable for a new rx due to being to soon per insurance.  She wants to know can she give her mother Aspirin until next month when she is able to receive the rx again.  The daughter would like a call back as soon as possible to know what to do.  I let her know I will forward this message to both Dr. Gwenlyn Found and his nurse to further advise.

## 2017-02-11 ENCOUNTER — Ambulatory Visit: Payer: Medicare Other | Admitting: Pulmonary Disease

## 2017-02-18 ENCOUNTER — Other Ambulatory Visit: Payer: Self-pay | Admitting: Pulmonary Disease

## 2017-03-04 ENCOUNTER — Ambulatory Visit: Payer: Medicare Other | Admitting: Pulmonary Disease

## 2017-03-22 ENCOUNTER — Other Ambulatory Visit: Payer: Self-pay | Admitting: Pulmonary Disease

## 2017-04-04 ENCOUNTER — Ambulatory Visit (INDEPENDENT_AMBULATORY_CARE_PROVIDER_SITE_OTHER): Payer: Medicare Other | Admitting: Adult Health

## 2017-04-04 ENCOUNTER — Encounter: Payer: Self-pay | Admitting: Adult Health

## 2017-04-04 VITALS — BP 181/86 | HR 84 | Ht 63.0 in | Wt 188.2 lb

## 2017-04-04 DIAGNOSIS — R413 Other amnesia: Secondary | ICD-10-CM | POA: Diagnosis not present

## 2017-04-04 NOTE — Progress Notes (Signed)
I have read the note, and I agree with the clinical assessment and plan.  Charles K Willis   

## 2017-04-04 NOTE — Patient Instructions (Signed)
Your Plan:  Continue Aricept and Namenda Memory score is stable If your symptoms worsen or you develop new symptoms please let us know.    Thank you for coming to see us at [facility]. I hope we have been able to provide you high quality care today.  You may receive a patient satisfaction survey over the next few weeks. We would appreciate your feedback and comments so that we may continue to improve ourselves and the health of our patients.  

## 2017-04-04 NOTE — Progress Notes (Signed)
PATIENT: [name] DOB: 10-Dec-1940  REASON FOR VISIT: follow up HISTORY FROM: patient  HISTORY OF PRESENT ILLNESS: Today 04/04/17 [name] is a 77 year old female with a history of memory disturbance.  She returns today for follow-up.  She reports that she feels that her memory may have gotten slightly worse.  She lives at home alone.  She does complete most ADLs independently.  She reports good appetite.  She reports that she receives Meals on Wheels at lunchtime.  She rarely uses the stove but does use the microwave.  Denies any trouble breathing other than sometimes she may have trouble falling asleep.  Her daughter manages her medications now.  Denies hallucinations and delusions.  Her blood pressure is slightly elevated today.  She reports that she did not take her morning medication yet.  The patient will be moving to [facility] April 1.  The daughter reports that they have a memory unit there if she should need it in the past.  They feel that the patient does need extra assistance and supervision during the day.  Patient returns today for an evaluation.   HISTORY  10/03/16 [name] is a 77 year old female with a history of memory disturbance. She returns today for follow-up. She is here today with her daughter. Her daughter reports that she has been forgetting to take her medications. She states that she fills her pill box at the beginning of the week but the patient takes her pills out and put them in a pill bottle versus taking them. The patient lives at home alone. She is able to complete most ADLs independently. She does operate a Teacher, music. The daughter reports that approximately one year ago she did get lost driving to a familiar location. Reports that she returned 3 hours later. Patient denies any trouble sleeping. Reports that she is eating okay however the daughter disagrees. Reports that she is eating very little and has lost weight. Daughter also reports  that the patient has been smoking cigarettes. It appears since the last visit she has loss approximately 20 pounds. Daughter also reports that she's been having diarrhea that has been ongoing for quite some time. She does feel that it may have started when Aricept was increased to 10 mg. Patient returns today for an evaluation   REVIEW OF SYSTEMS: Out of a complete 14 system review of symptoms, the patient complains only of the following symptoms, and all other reviewed systems are negative.  Wheezing, agitation  ALLERGIES: Allergies  Allergen Reactions  . Glucophage [Metformin Hydrochloride] Itching and Rash    REPORTED BY PATIENT; STATES SYMPTOMS APPEARED AFTER THIRD DOES WITHIN THE LAST MONTH.  Marland Kitchen Alendronate Sodium Other (See Comments)    Unknown   . Metformin And Related Other (See Comments)    unknown  . Omnipaque [Iohexol] Other (See Comments)    Unknown   . Valsartan Other (See Comments)    unknown  . Adhesive [Tape] Rash  . Amoxicillin Itching and Rash  . Avelox [Moxifloxacin Hcl In Nacl] Itching and Rash  . Cephalexin Itching and Rash  . Contrast Media [Iodinated Diagnostic Agents] Itching and Rash  . Latex Rash  . Plavix [Clopidogrel Bisulfate] Itching and Rash    HOME MEDICATIONS: Outpatient Medications Prior to Visit  Medication Sig Dispense Refill  . albuterol (PROVENTIL HFA;VENTOLIN HFA) 108 (90 Base) MCG/ACT inhaler Inhale into the lungs.    Marland Kitchen buPROPion (ZYBAN) 150 MG 12 hr tablet TAKE 1 TABLET(150 MG) BY  MOUTH TWICE DAILY 60 tablet 6  . diltiazem (CARTIA XT) 120 MG 24 hr capsule 120 mg of amoxicillin daily.    Marland Kitchen donepezil (ARICEPT) 5 MG tablet Take 1 tablet (5 mg total) by mouth at bedtime. 90 tablet 3  . ELIQUIS 5 MG TABS tablet TAKE 1 TABLET(5 MG) BY MOUTH TWICE DAILY 60 tablet 5  . levothyroxine (SYNTHROID, LEVOTHROID) 100 MCG tablet Take 100 mcg by mouth daily before breakfast.    . memantine (NAMENDA) 10 MG tablet TAKE 1 TABLET(10 MG) BY MOUTH TWICE DAILY  180 tablet 1  . metoprolol tartrate (LOPRESSOR) 25 MG tablet Take 1 tablet (25 mg total) by mouth 2 (two) times daily. 60 tablet 1  . omeprazole (PRILOSEC) 40 MG capsule TAKE 1 CAPSULE(40 MG) BY MOUTH EVERY DAY BEFORE A MEAL    . SYMBICORT 160-4.5 MCG/ACT inhaler USE 2 PUFFS BY MOUTH TWICE DAILY 10.2 g 2  . vitamin B-12 (CYANOCOBALAMIN) 1000 MCG tablet Take 1,000 mcg by mouth daily.    Marland Kitchen glimepiride (AMARYL) 1 MG tablet 1 mg daily.     No facility-administered medications prior to visit.     PAST MEDICAL HISTORY: Past Medical History:  Diagnosis Date  . Arthritis   . Chronic obstructive asthma 05/28/2011  . Claudication ([place])    LEA DOPPLER, 12/10/2011 - FEMORAL-FEMORAL BYPASS GRAFT-occluded; LEFT TIBIAL-appeared occluded;   . COPD (chronic obstructive pulmonary disease) ([place])   . Diabetes mellitus   . DOE (dyspnea on exertion)    NUCLEAR STRESS TEST, 08/03/2010 - EKG negative for ischemia  . GERD (gastroesophageal reflux disease)   . Hypercholesteremia   . Hypertension   . Hyperthyroidism   . Hypothyroidism   . Memory difficulties 04/19/2015  . Obesity   . PAD (peripheral artery disease) ([place])   . Shortness of breath   . SOB (shortness of breath)    2D ECHO, 03/01/2011 - EF 65-70%, moderate LVH,     PAST SURGICAL HISTORY: Past Surgical History:  Procedure Laterality Date  . ABDOMINAL ANGIOGRAM Left 12/12/2010   Procedure: ABDOMINAL ANGIOGRAM;  Surgeon: Lorretta Harp, MD;  Location[facility] CATH LAB;  Service: Cardiovascular;  Laterality: Left;  . CARDIAC CATHETERIZATION  10/18/2008   No intervention  . CARDIAC CATHETERIZATION  09/14/2008   Continue medical therapy  . DESCENDING AORTIC ANEURYSM REPAIR W/ STENT     Stent repair  . FEMORAL-FEMORAL BYPASS GRAFT  05/09/2010   Right to Left Fem-Fem BPG by Dr. Scot Dock  . LOWER EXTREMITY ANGIOGRAM N/A 04/05/2014   Procedure: LOWER EXTREMITY ANGIOGRAM;  Surgeon: Lorretta Harp, MD;  Location: [facility] CATH LAB;  Service: Cardiovascular;   Laterality: N/A;  . PERCUTANEOUS STENT INTERVENTION  12/12/2010   Rt common iliac stenosis stented with a 8x39mm ICAST covered stent resulting in a reduction of a 50% in-stent restenosis to 0%  . PERCUTANEOUS STENT INTERVENTION  04/20/2010   Rt common iliac stenosis stented with a 8x18 Genesis OPTA stent balloon resulting in reduciton of a 70% stenosis to 0% residual  . PERCUTANEOUS STENT INTERVENTION  10/18/2008   No intervention  . PERCUTANEOUS STENT INTERVENTION  09/14/2008   Rt common iliac stenosis stented with a 4x10 Cordis stent resulting in a reduction of 99% stenosis to less than 20% residual  . PERCUTANEOUS STENT INTERVENTION Right 12/12/2010   Procedure: PERCUTANEOUS STENT INTERVENTION;  Surgeon: Lorretta Harp, MD;  Location: [facility] CATH LAB;  Service: Cardiovascular;  Laterality: Right;  . TUBAL LIGATION      FAMILY HISTORY: Family  History  Problem Relation Age of Onset  . Diabetes Mother   . Heart disease Mother   . Hyperlipidemia Mother   . Cancer Sister   . Allergies Daughter        x3  . Breast cancer Neg Hx     SOCIAL HISTORY: Social History   Socioeconomic History  . Marital status: Divorced    Spouse name: Not on file  . Number of children: 3  . Years of education: 31  . Highest education level: Not on file  Occupational History  . Occupation: Retired    Fish farm manager: RETIRED  Social Needs  . Financial resource strain: Not on file  . Food insecurity:    Worry: Not on file    Inability: Not on file  . Transportation needs:    Medical: Not on file    Non-medical: Not on file  Tobacco Use  . Smoking status: Current Every Day Smoker    Packs/day: 1.00    Years: 47.00    Pack years: 47.00    Types: Cigarettes  . Smokeless tobacco: Never Used  . Tobacco comment: 10/03/16 1 PPD  Substance and Sexual Activity  . Alcohol use: No  . Drug use: No  . Sexual activity: Not Currently  Lifestyle  . Physical activity:    Days per week: Not on file    Minutes per  session: Not on file  . Stress: Not on file  Relationships  . Social connections:    Talks on phone: Not on file    Gets together: Not on file    Attends religious service: Not on file    Active member of club or organization: Not on file    Attends meetings of clubs or organizations: Not on file    Relationship status: Not on file  . Intimate partner violence:    Fear of current or ex partner: Not on file    Emotionally abused: Not on file    Physically abused: Not on file    Forced sexual activity: Not on file  Other Topics Concern  . Not on file  Social History Narrative   Lives at home alone   Right-handed   Caffeine: 2 sodas per day      PHYSICAL EXAM  Vitals:   04/04/17 1055  BP: (!) 181/86  Pulse: 84  Weight: 188 lb 3.2 oz (85.4 kg)  Height: 5\' 3"  (1.6 m)   Body mass index is 33.34 kg/m.   MMSE - Mini Mental State Exam 04/04/2017 10/03/2016 04/02/2016  Orientation to time 2 1 2   Orientation to Place 4 4 5   Registration 3 3 3   Attention/ Calculation 5 4 2   Recall 2 1 2   Language- name 2 objects 2 2 2   Language- repeat 1 0 1  Language- follow 3 step command 3 3 3   Language- read & follow direction 1 1 1   Write a sentence 1 1 1   Copy design 0 1 1  Total score 24 21 23      Generalized: Well developed, in no acute distress   Neurological examination  Mentation: Alert oriented to time, place, history taking. Follows all commands speech and language fluent Cranial nerve II-XII: Pupils were equal round reactive to light. Extraocular movements were full, visual field were full on confrontational test. Facial sensation and strength were normal. Uvula tongue midline. Head turning and shoulder shrug  were normal and symmetric. Motor: The motor testing reveals 5 over 5 strength of all 4 extremities.  Good symmetric motor tone is noted throughout.  Sensory: Sensory testing is intact to soft touch on all 4 extremities. No evidence of extinction is noted.  Coordination:  Cerebellar testing reveals good finger-nose-finger and heel-to-shin bilaterally.  Gait and station: Patient uses a cane when ambulating Reflexes: Deep tendon reflexes are symmetric and normal bilaterally.   DIAGNOSTIC DATA (LABS, IMAGING, TESTING) - I reviewed patient records, labs, notes, testing and imaging myself where available.  Lab Results  Component Value Date   WBC 6.2 04/10/2015   HGB 12.8 04/10/2015   HCT 39.9 04/10/2015   MCV 77.9 (L) 04/10/2015   PLT 215 04/10/2015      Component Value Date/Time   NA 139 03/08/2016 1517   K 4.3 03/08/2016 1517   CL 106 03/08/2016 1517   CO2 21 03/08/2016 1517   GLUCOSE 97 03/08/2016 1517   BUN 16 03/08/2016 1517   CREATININE 1.23 (H) 03/08/2016 1517   CALCIUM 9.7 03/08/2016 1517   PROT 6.5 03/23/2014 1643   ALBUMIN 3.7 03/23/2014 1643   AST 19 03/23/2014 1643   ALT 16 03/23/2014 1643   ALKPHOS 60 03/23/2014 1643   BILITOT 0.3 03/23/2014 1643   GFRNONAA 58 (L) 04/14/2015 0457   GFRAA >60 04/14/2015 0457   No results found for: CHOL, HDL, LDLCALC, LDLDIRECT, TRIG, CHOLHDL Lab Results  Component Value Date   HGBA1C 5.7 (H) 04/10/2015   No results found for: VITAMINB12 Lab Results  Component Value Date   TSH 2.830 03/23/2014      ASSESSMENT AND PLAN 77 y.o. year old female  has a past medical history of Arthritis, Chronic obstructive asthma (05/28/2011), Claudication ([place]), COPD (chronic obstructive pulmonary disease) ([place]), Diabetes mellitus, DOE (dyspnea on exertion), GERD (gastroesophageal reflux disease), Hypercholesteremia, Hypertension, Hyperthyroidism, Hypothyroidism, Memory difficulties (04/19/2015), Obesity, PAD (peripheral artery disease) ([place]), Shortness of breath, and SOB (shortness of breath). here with:  1.  Memory disturbance  The patient's memory score has remained stable.  She will continue on Aricept and Namenda.  She will be moving to an assisted care facility next month.  Patient is advised that if her  symptoms worsen or she develops new symptoms she should let us know.  We did discuss potentially enrolling her in a research trial-- patient deferred for now.  She will follow-up in 6 or sooner if needed.   I spent 15 minutes with the patient. 50% of this time was spent discussing her memory score   Ward Givens, MSN, NP-C 04/04/2017, 11:27 AM [facility] [address], [place], [place] [address] [phone]

## 2017-04-11 ENCOUNTER — Telehealth: Payer: Self-pay | Admitting: Pulmonary Disease

## 2017-04-11 NOTE — Telephone Encounter (Signed)
Patient is scheduled to see VS on 04/12/17 at 9am. Per Corene Cornea at [facility] has not be performed yet. Patient's daughter actually arranged to have her O2 picked up on 04/10/17 due to the patient moving into an assisted living home.   Left message for Cecille Rubin [facility] in patient's chart) to see where the patient went and if any help is needed.   Will route to [facility] so she is aware.

## 2017-04-12 ENCOUNTER — Ambulatory Visit (INDEPENDENT_AMBULATORY_CARE_PROVIDER_SITE_OTHER): Payer: Medicare Other | Admitting: Pulmonary Disease

## 2017-04-12 ENCOUNTER — Encounter: Payer: Self-pay | Admitting: Pulmonary Disease

## 2017-04-12 VITALS — BP 142/90 | HR 79 | Ht 62.0 in | Wt 188.6 lb

## 2017-04-12 DIAGNOSIS — Z720 Tobacco use: Secondary | ICD-10-CM

## 2017-04-12 DIAGNOSIS — J449 Chronic obstructive pulmonary disease, unspecified: Secondary | ICD-10-CM

## 2017-04-12 MED ORDER — ALBUTEROL SULFATE HFA 108 (90 BASE) MCG/ACT IN AERS: 2.0000 | INHALATION_SPRAY | RESPIRATORY_TRACT | 5 refills | Status: DC | PRN

## 2017-04-12 MED ORDER — GUAIFENESIN ER 1200 MG PO TB12: 1.0000 | ORAL_TABLET | Freq: Two times a day (BID) | ORAL | 0 refills | Status: AC | PRN

## 2017-04-12 NOTE — Patient Instructions (Signed)
Mucinex 1 pill twice per day as needed to help loosen phlegm  Follow up in 1 year

## 2017-04-12 NOTE — Telephone Encounter (Signed)
Patient was seen by Dr. Halford Chessman on 04/12/17 and was decided to not do the ONO and the O2 was already picked up and will be managed at the facility by those providers.   Nothing further is needed.

## 2017-04-12 NOTE — Progress Notes (Signed)
[facility]  Chief Complaint  Patient presents with  . Follow-up    did not have ONO, moving to assisted living (dementia)     Vital signs: BP (!) 142/90 (BP Location: Left Arm, Cuff Size: Normal)   Pulse 79   Ht 5\' 2"  (1.575 m)   Wt 188 lb 9.6 oz (85.5 kg)   SpO2 94%   BMI 34.50 kg/m   History of Present Illness: [name] is a 77 y.o. female former smoker with COPD and asthma.  She is here with her daughter.  She is moving into assisted living next week.  Has progressive dementia.    No longer using oxygen at night.  Sleeps okay.  Has cough with clear sputum and intermittent wheeze.  Not using albuterol much.   Denies chest pain, fever, hemoptysis, or swelling.  Still smokes cigarettes.  She apparently stopped smoking, but forgot that she quit after she developed dementia and then started smoking again.  Physical Exam:  General - pleasant Eyes - pupils reactive ENT - no sinus tenderness, no oral exudate, no LAN Cardiac - regular, no murmur Chest - faint b/l wheeze that clear with coughing Abd - soft, non tender Ext - no edema Skin - no rashes Neuro - normal strength Psych - normal mood   Assessment/Plan:  COPD with asthma. - continue symbicort - prn albuterol - she can use prn mucinex - discussed when to use her inhalers  Tobacco abuse. - discussed options to assist with smoking cessation  Chronic respiratory failure with hypoxia. - no longer using oxygen at night - sleep pattern okay - defer further assessment at this time   Patient Instructions  Mucinex 1 pill twice per day as needed to help loosen phlegm  Follow up in 1 year    Chesley Mires, MD [place] 04/12/2017, 10:00 AM Pager:  [phone]  Flow Sheet  Pulmonary tests: V/Q scan 05/15/10 >> very low probabilty for pulmonary embolism PFT 05/28/11 >> FEV1 1.01 (54%), FEV1% 73, TLC 4.44 (100%), RV 2.74 (156%), DLCO 49%,  +BD response  Cardiac tests Echo 03/01/11 >> mod LVH, EF 65 to 86%, grade 1 diastolic dysfx, mild LA dilation, mild RA/RV dilation  Past Medical History: She  has a past medical history of Arthritis, Chronic obstructive asthma (05/28/2011), Claudication ([place]), COPD (chronic obstructive pulmonary disease) ([place]), Diabetes mellitus, DOE (dyspnea on exertion), GERD (gastroesophageal reflux disease), Hypercholesteremia, Hypertension, Hyperthyroidism, Hypothyroidism, Memory difficulties (04/19/2015), Obesity, PAD (peripheral artery disease) ([place]), Shortness of breath, and SOB (shortness of breath).  Past Surgical History: She  has a past surgical history that includes Femoral-femoral Bypass Graft (05/09/2010); Tubal ligation; Descending aortic aneurysm repair w/ stent; Percutaneous stent intervention (12/12/2010); Percutaneous stent intervention (04/20/2010); Percutaneous stent intervention (10/18/2008); Percutaneous stent intervention (09/14/2008); Cardiac catheterization (10/18/2008); Cardiac catheterization (09/14/2008); abdominal angiogram (Left, 12/12/2010); percutaneous stent intervention (Right, 12/12/2010); and lower extremity angiogram (N/A, 04/05/2014).  Family History: Her family history includes Allergies in her daughter; Cancer in her sister; Diabetes in her mother; Heart disease in her mother; Hyperlipidemia in her mother.  Social History: She  reports that she has been smoking cigarettes.  She has a 47.00 pack-year smoking history. She has never used smokeless tobacco. She reports that she does not drink alcohol or use drugs.  Medications: Allergies as of 04/12/2017      Reactions   Glucophage [metformin Hydrochloride] Itching, Rash   REPORTED BY PATIENT; STATES SYMPTOMS APPEARED AFTER THIRD DOES WITHIN THE LAST  MONTH.   Alendronate Sodium Other (See Comments)   Unknown   Metformin And Related Other (See Comments)   unknown   Omnipaque [iohexol] Other (See Comments)   Unknown   Valsartan  Other (See Comments)   unknown   Adhesive [tape] Rash   Amoxicillin Itching, Rash   Avelox [moxifloxacin Hcl In Nacl] Itching, Rash   Cephalexin Itching, Rash   Contrast Media [iodinated Diagnostic Agents] Itching, Rash   Latex Rash   Plavix [clopidogrel Bisulfate] Itching, Rash      Medication List        Accurate as of 04/12/17 10:00 AM. Always use your most recent med list.          albuterol 108 (90 Base) MCG/ACT inhaler Commonly known as:  PROVENTIL HFA;VENTOLIN HFA Inhale 2 puffs into the lungs every 4 (four) hours as needed for wheezing or shortness of breath.   CARTIA XT 120 MG 24 hr capsule Generic drug:  diltiazem 120 mg of amoxicillin daily.   donepezil 5 MG tablet Commonly known as:  ARICEPT Take 1 tablet (5 mg total) by mouth at bedtime.   ELIQUIS 5 MG Tabs tablet Generic drug:  apixaban TAKE 1 TABLET(5 MG) BY MOUTH TWICE DAILY   Guaifenesin 1200 MG Tb12 Commonly known as:  MUCINEX MAXIMUM STRENGTH Take 1 tablet (1,200 mg total) by mouth 2 (two) times daily as needed (Cough).   memantine 10 MG tablet Commonly known as:  NAMENDA TAKE 1 TABLET(10 MG) BY MOUTH TWICE DAILY   metoprolol tartrate 25 MG tablet Commonly known as:  LOPRESSOR Take 1 tablet (25 mg total) by mouth 2 (two) times daily.   SYMBICORT 160-4.5 MCG/ACT inhaler Generic drug:  budesonide-formoterol USE 2 PUFFS BY MOUTH TWICE DAILY   vitamin B-12 1000 MCG tablet Commonly known as:  CYANOCOBALAMIN Take 1,000 mcg by mouth daily.

## 2017-04-15 ENCOUNTER — Telehealth: Payer: Self-pay | Admitting: *Deleted

## 2017-04-15 ENCOUNTER — Telehealth: Payer: Self-pay | Admitting: Cardiovascular Disease

## 2017-04-15 MED ORDER — DONEPEZIL HCL 5 MG PO TABS: 5.0000 mg | ORAL_TABLET | Freq: Every day | ORAL | 3 refills | Status: AC

## 2017-04-15 NOTE — Telephone Encounter (Signed)
New Message:     *STAT* If patient is at the pharmacy, call can be transferred to refill team.   1. Which medications need to be refilled? (please list name of each medication and dose if known) ELIQUIS 5 MG TABS tablet  2. Which pharmacy/location (including street and city if local pharmacy) is medication to be sent to?[place] [phone] fax 3. Do they need a 30 day or 90 day supply? 57  Pt's daughter states pt has moved into nursing facility and they have a in house pharmacy and need prescription sent there in order to fill for pt.

## 2017-04-15 NOTE — Telephone Encounter (Signed)
Ok per MM,NP to send refills for rx aricept 5mg  tab. Printed rx qty 90, 3 refills. Faxed signed rx as requested. Received fax confirmation.

## 2017-04-18 NOTE — Telephone Encounter (Signed)
All current medicines that we are responsible for. Forward to [place] for medication reconciliation

## 2017-04-19 MED ORDER — APIXABAN 5 MG PO TABS: ORAL_TABLET | ORAL | 5 refills | Status: AC

## 2017-04-19 NOTE — Telephone Encounter (Signed)
done

## 2017-06-19 ENCOUNTER — Telehealth: Payer: Self-pay | Admitting: Cardiovascular Disease

## 2017-06-19 ENCOUNTER — Emergency Department (HOSPITAL_COMMUNITY)
Admission: EM | Admit: 2017-06-19 | Discharge: 2017-06-20 | Disposition: A | Discharge status: Home / Self Care | Payer: Medicare Other | Attending: Emergency Medicine | Admitting: Emergency Medicine

## 2017-06-19 ENCOUNTER — Other Ambulatory Visit: Payer: Self-pay

## 2017-06-19 ENCOUNTER — Emergency Department (HOSPITAL_COMMUNITY): Payer: Medicare Other

## 2017-06-19 DIAGNOSIS — F17210 Nicotine dependence, cigarettes, uncomplicated: Secondary | ICD-10-CM | POA: Diagnosis not present

## 2017-06-19 DIAGNOSIS — Z7901 Long term (current) use of anticoagulants: Secondary | ICD-10-CM | POA: Diagnosis not present

## 2017-06-19 DIAGNOSIS — J449 Chronic obstructive pulmonary disease, unspecified: Secondary | ICD-10-CM | POA: Diagnosis not present

## 2017-06-19 DIAGNOSIS — Y999 Unspecified external cause status: Secondary | ICD-10-CM | POA: Diagnosis not present

## 2017-06-19 DIAGNOSIS — Y93E1 Activity, personal bathing and showering: Secondary | ICD-10-CM | POA: Diagnosis not present

## 2017-06-19 DIAGNOSIS — I10 Essential (primary) hypertension: Secondary | ICD-10-CM | POA: Diagnosis not present

## 2017-06-19 DIAGNOSIS — I70219 Atherosclerosis of native arteries of extremities with intermittent claudication, unspecified extremity: Secondary | ICD-10-CM | POA: Diagnosis not present

## 2017-06-19 DIAGNOSIS — E039 Hypothyroidism, unspecified: Secondary | ICD-10-CM | POA: Diagnosis not present

## 2017-06-19 DIAGNOSIS — Z79899 Other long term (current) drug therapy: Secondary | ICD-10-CM | POA: Diagnosis not present

## 2017-06-19 DIAGNOSIS — Y92192 Bathroom in other specified residential institution as the place of occurrence of the external cause: Secondary | ICD-10-CM | POA: Diagnosis not present

## 2017-06-19 DIAGNOSIS — E119 Type 2 diabetes mellitus without complications: Secondary | ICD-10-CM | POA: Diagnosis not present

## 2017-06-19 DIAGNOSIS — W07XXXA Fall from chair, initial encounter: Secondary | ICD-10-CM | POA: Diagnosis not present

## 2017-06-19 DIAGNOSIS — W19XXXA Unspecified fall, initial encounter: Secondary | ICD-10-CM

## 2017-06-19 DIAGNOSIS — S0990XA Unspecified injury of head, initial encounter: Secondary | ICD-10-CM | POA: Insufficient documentation

## 2017-06-19 MED ORDER — ACETAMINOPHEN 325 MG PO TABS
650.0000 mg | ORAL_TABLET | Freq: Once | ORAL | Status: AC
  Administered 2017-06-19: 650 mg via ORAL
  Filled 2017-06-19: qty 2

## 2017-06-19 NOTE — Telephone Encounter (Signed)
   Callback staff, please find out what the dental office's question is, when the procedure is (urgency), or what kind of anesthesia it will be. The patient has not been seen in over a years' time so will also need an appointment but this may be something we can help with in the meantime.  Charlie Pitter, PA-C 06/19/2017, 4:53 PM

## 2017-06-19 NOTE — Telephone Encounter (Signed)
New Message    1. What dental office are you calling from? [facility]  2. What is your office phone number? [phone]  3. What is your fax number? [phone]  4. What type of procedure is the patient having performed? extraction  5. What date is procedure scheduled or is the patient there now? TBD  6. What is your question (ex. Antibiotics prior to procedure, holding medication-we need to know how long dentist wants pt to hold med)? Not sure

## 2017-06-19 NOTE — ED Provider Notes (Signed)
[facility]PART[facility] Provider Note   CSN: [id number] Arrival date & time: 06/19/17  2110     History   Chief Complaint Chief Complaint  Patient presents with  . Fall    HPI [name] is a 77 y.o. female.  The history is provided by the patient and medical records.  Fall  Associated symptoms include headaches (head injury).    77 y.o. F with hx of arthritis, chronic obstructive asthma, claudication, COPD< diabetes, GERD, HLP, HTN, hyperthyroidism, memory issues, PAD, presenting to the ED following a fall.  Patient is currently living at assisted living.  States she was about to go take a shower and went to go sit back on a chair and missed it and fell backwards, struck her head on the floor.  No LOC.  She was able to get up and walk around afterwards.  States she only has some mild pain of the right posterior aspect of her head.  She denies any other injuries or areas of pain.  No dizziness, numbness, weakness.  She does take eliquis for AFIB.  Past Medical History:  Diagnosis Date  . Arthritis   . Chronic obstructive asthma 05/28/2011  . Claudication ([place])    LEA DOPPLER, 12/10/2011 - FEMORAL-FEMORAL BYPASS GRAFT-occluded; LEFT TIBIAL-appeared occluded;   . COPD (chronic obstructive pulmonary disease) ([place])   . Diabetes mellitus   . DOE (dyspnea on exertion)    NUCLEAR STRESS TEST, 08/03/2010 - EKG negative for ischemia  . GERD (gastroesophageal reflux disease)   . Hypercholesteremia   . Hypertension   . Hyperthyroidism   . Hypothyroidism   . Memory difficulties 04/19/2015  . Obesity   . PAD (peripheral artery disease) ([place])   . Shortness of breath   . SOB (shortness of breath)    2D ECHO, 03/01/2011 - EF 65-70%, moderate LVH,     Patient Active Problem List   Diagnosis Date Noted  . Memory difficulties 04/19/2015  . Acute respiratory failure with hypoxia ([place]) 04/14/2015  . COPD exacerbation ([place]) 04/10/2015  . Hypothyroidism  04/10/2015  . Renal insufficiency 03/23/2014  . Upper airway cough syndrome 01/28/2013  . H/O cardiac catheterization, 2010, no significant CAD after Positive stress test 10/30/2012  . Rash, secondary to medication.   10/30/2012  . Atherosclerosis of native arteries of extremity with intermittent claudication ([place]) 06/27/2011  . COPD with asthma ([place]) 05/28/2011  . Dyspnea 05/01/2011  . Peripheral vascular disease, unspecified ([place]) 01/31/2011  . Obesity 12/13/2010  . PAF (paroxysmal atrial fibrillation) ([place]) 12/13/2010  . Diabetes mellitus ([place]) 12/13/2010  . Contrast media allergy 12/13/2010  . Essential hypertension 10/21/2006  . PVD WITH CLAUDICATION 10/21/2006    Past Surgical History:  Procedure Laterality Date  . ABDOMINAL ANGIOGRAM Left 12/12/2010   Procedure: ABDOMINAL ANGIOGRAM;  Surgeon: Lorretta Harp, MD;  Location: [facility] CATH LAB;  Service: Cardiovascular;  Laterality: Left;  . CARDIAC CATHETERIZATION  10/18/2008   No intervention  . CARDIAC CATHETERIZATION  09/14/2008   Continue medical therapy  . DESCENDING AORTIC ANEURYSM REPAIR W/ STENT     Stent repair  . FEMORAL-FEMORAL BYPASS GRAFT  05/09/2010   Right to Left Fem-Fem BPG by Dr. Scot Dock  . LOWER EXTREMITY ANGIOGRAM N/A 04/05/2014   Procedure: LOWER EXTREMITY ANGIOGRAM;  Surgeon: Lorretta Harp, MD;  Location: [facility] CATH LAB;  Service: Cardiovascular;  Laterality: N/A;  . PERCUTANEOUS STENT INTERVENTION  12/12/2010   Rt common iliac stenosis stented with a 8x77mm ICAST covered stent  resulting in a reduction of a 50% in-stent restenosis to 0%  . PERCUTANEOUS STENT INTERVENTION  04/20/2010   Rt common iliac stenosis stented with a 8x18 Genesis OPTA stent balloon resulting in reduciton of a 70% stenosis to 0% residual  . PERCUTANEOUS STENT INTERVENTION  10/18/2008   No intervention  . PERCUTANEOUS STENT INTERVENTION  09/14/2008   Rt common iliac stenosis stented with a 4x10 Cordis stent resulting in a reduction of 99%  stenosis to less than 20% residual  . PERCUTANEOUS STENT INTERVENTION Right 12/12/2010   Procedure: PERCUTANEOUS STENT INTERVENTION;  Surgeon: Lorretta Harp, MD;  Location: [facility] CATH LAB;  Service: Cardiovascular;  Laterality: Right;  . TUBAL LIGATION       OB History   None      Home Medications    Prior to Admission medications   Medication Sig Start Date End Date Taking? Authorizing Provider  albuterol (PROVENTIL HFA;VENTOLIN HFA) 108 (90 Base) MCG/ACT inhaler Inhale 2 puffs into the lungs every 4 (four) hours as needed for wheezing or shortness of breath. 04/12/17   Chesley Mires, MD  apixaban (ELIQUIS) 5 MG TABS tablet TAKE 1 TABLET(5 MG) BY MOUTH TWICE DAILY 04/19/17   Lorretta Harp, MD  diltiazem (CARTIA XT) 120 MG 24 hr capsule 120 mg of amoxicillin daily. 04/16/16   [provider]  donepezil (ARICEPT) 5 MG tablet Take 1 tablet (5 mg total) by mouth at bedtime. 04/15/17   Ward Givens, NP  Guaifenesin (MUCINEX MAXIMUM STRENGTH) 1200 MG TB12 Take 1 tablet (1,200 mg total) by mouth 2 (two) times daily as needed (Cough). 04/12/17   Chesley Mires, MD  memantine (NAMENDA) 10 MG tablet TAKE 1 TABLET(10 MG) BY MOUTH TWICE DAILY 11/12/16   Kathrynn Ducking, MD  metoprolol tartrate (LOPRESSOR) 25 MG tablet Take 1 tablet (25 mg total) by mouth 2 (two) times daily. 04/14/15   Orson Eva, MD  SYMBICORT 160-4.5 MCG/ACT inhaler USE 2 PUFFS BY MOUTH TWICE DAILY 04/01/14   Chesley Mires, MD  vitamin B-12 (CYANOCOBALAMIN) 1000 MCG tablet Take 1,000 mcg by mouth daily.    [provider]    Family History Family History  Problem Relation Age of Onset  . Diabetes Mother   . Heart disease Mother   . Hyperlipidemia Mother   . Cancer Sister   . Allergies Daughter        x3  . Breast cancer Neg Hx     Social History Social History   Tobacco Use  . Smoking status: Current Every Day Smoker    Packs/day: 1.00    Years: 47.00    Pack years: 47.00    Types: Cigarettes  .  Smokeless tobacco: Never Used  . Tobacco comment: 10/03/16 1 PPD  Substance Use Topics  . Alcohol use: No  . Drug use: No     Allergies   Glucophage [metformin hydrochloride]; Alendronate sodium; Metformin and related; Omnipaque [iohexol]; Valsartan; Adhesive [tape]; Amoxicillin; Avelox [moxifloxacin hcl in nacl]; Cephalexin; Contrast media [iodinated diagnostic agents]; Latex; and Plavix [clopidogrel bisulfate]   Review of Systems Review of Systems  Neurological: Positive for headaches (head injury).  All other systems reviewed and are negative.    Physical Exam Updated Vital Signs Ht 5\' 3"  (1.6 m)   Wt 81.6 kg (180 lb)   SpO2 96%   BMI 31.89 kg/m   Physical Exam  Constitutional: She is oriented to person, place, and time. She appears well-developed and well-nourished.  HENT:  Head: Normocephalic and  atraumatic.  Mouth/Throat: Oropharynx is clear and moist.  Moderate sized hematoma to right occipital scalp; there is no open wound/laceration; area is locally tender; no skull depression noted  Eyes: Pupils are equal, round, and reactive to light. Conjunctivae and EOM are normal.  Neck: Normal range of motion.  Cardiovascular: Normal rate, regular rhythm and normal heart sounds.  Pulmonary/Chest: Effort normal and breath sounds normal. No stridor. No respiratory distress.  Abdominal: Soft. Bowel sounds are normal. There is no tenderness. There is no rebound.  Musculoskeletal: Normal range of motion.  Hips non-tender, full ROM, pelvis stable  Neurological: She is alert and oriented to person, place, and time.  Skin: Skin is warm and dry.  Psychiatric: She has a normal mood and affect.  Nursing note and vitals reviewed.    ED Treatments / Results  Labs (all labs ordered are listed, but only abnormal results are displayed) Labs Reviewed - No data to display  EKG None  Radiology Ct Head Wo Contrast  Result Date: 06/19/2017 CLINICAL DATA:  Status post fall while  getting into shower, hitting head. Concern for cervical spine injury. EXAM: CT HEAD WITHOUT CONTRAST CT CERVICAL SPINE WITHOUT CONTRAST TECHNIQUE: Multidetector CT imaging of the head and cervical spine was performed following the standard protocol without intravenous contrast. Multiplanar CT image reconstructions of the cervical spine were also generated. COMPARISON:  MRI of the brain performed 05/05/2015 FINDINGS: CT HEAD FINDINGS Brain: No evidence of acute infarction, hemorrhage, hydrocephalus, extra-axial collection or mass lesion / mass effect. Prominence of the sulci reflects mild cortical volume loss. Mild cerebellar atrophy is noted. The brainstem and fourth ventricle are within normal limits. The basal ganglia are unremarkable in appearance. The cerebral hemispheres demonstrate grossly normal gray-white differentiation. No mass effect or midline shift is seen. Vascular: No hyperdense vessel or unexpected calcification. Skull: There is no evidence of fracture; visualized osseous structures are unremarkable in appearance. Sinuses/Orbits: The visualized portions of the orbits are within normal limits. There is partial opacification of the right mastoid air cells. The paranasal sinuses and left mastoid air cells are well-aerated. Other: Soft tissue swelling is noted overlying the right posterior parietal calvarium. CT CERVICAL SPINE FINDINGS Alignment: There is grade 1 anterolisthesis of C3 on C4. Skull base and vertebrae: No acute fracture. No primary bone lesion or focal pathologic process. Soft tissues and spinal canal: No prevertebral fluid or swelling. No visible canal hematoma. Disc levels: Multilevel disc space narrowing is noted along the lower cervical spine. Scattered anterior and posterior disc osteophyte complexes are noted along the lower cervical spine. Upper chest: The thyroid gland is unremarkable. The visualized lung apices are clear. Calcification is noted at the carotid bifurcations  bilaterally. Other: No additional soft tissue abnormalities are seen. IMPRESSION: 1. No evidence of traumatic intracranial injury or fracture. 2. No evidence of acute fracture or subluxation along the cervical spine. 3. Soft tissue swelling overlying the right posterior parietal calvarium. 4. Mild cortical volume loss noted. 5. Mild degenerative change noted along the cervical spine. 6. Calcification at the carotid bifurcations bilaterally. Carotid ultrasound would be helpful for further evaluation, when and as deemed clinically appropriate. 7. Partial opacification of the right mastoid air cells. Electronically Signed   By: Garald Balding M.D.   On: 06/19/2017 23:21   Ct Cervical Spine Wo Contrast  Result Date: 06/19/2017 CLINICAL DATA:  Status post fall while getting into shower, hitting head. Concern for cervical spine injury. EXAM: CT HEAD WITHOUT CONTRAST CT CERVICAL SPINE WITHOUT CONTRAST  TECHNIQUE: Multidetector CT imaging of the head and cervical spine was performed following the standard protocol without intravenous contrast. Multiplanar CT image reconstructions of the cervical spine were also generated. COMPARISON:  MRI of the brain performed 05/05/2015 FINDINGS: CT HEAD FINDINGS Brain: No evidence of acute infarction, hemorrhage, hydrocephalus, extra-axial collection or mass lesion / mass effect. Prominence of the sulci reflects mild cortical volume loss. Mild cerebellar atrophy is noted. The brainstem and fourth ventricle are within normal limits. The basal ganglia are unremarkable in appearance. The cerebral hemispheres demonstrate grossly normal gray-white differentiation. No mass effect or midline shift is seen. Vascular: No hyperdense vessel or unexpected calcification. Skull: There is no evidence of fracture; visualized osseous structures are unremarkable in appearance. Sinuses/Orbits: The visualized portions of the orbits are within normal limits. There is partial opacification of the right  mastoid air cells. The paranasal sinuses and left mastoid air cells are well-aerated. Other: Soft tissue swelling is noted overlying the right posterior parietal calvarium. CT CERVICAL SPINE FINDINGS Alignment: There is grade 1 anterolisthesis of C3 on C4. Skull base and vertebrae: No acute fracture. No primary bone lesion or focal pathologic process. Soft tissues and spinal canal: No prevertebral fluid or swelling. No visible canal hematoma. Disc levels: Multilevel disc space narrowing is noted along the lower cervical spine. Scattered anterior and posterior disc osteophyte complexes are noted along the lower cervical spine. Upper chest: The thyroid gland is unremarkable. The visualized lung apices are clear. Calcification is noted at the carotid bifurcations bilaterally. Other: No additional soft tissue abnormalities are seen. IMPRESSION: 1. No evidence of traumatic intracranial injury or fracture. 2. No evidence of acute fracture or subluxation along the cervical spine. 3. Soft tissue swelling overlying the right posterior parietal calvarium. 4. Mild cortical volume loss noted. 5. Mild degenerative change noted along the cervical spine. 6. Calcification at the carotid bifurcations bilaterally. Carotid ultrasound would be helpful for further evaluation, when and as deemed clinically appropriate. 7. Partial opacification of the right mastoid air cells. Electronically Signed   By: Garald Balding M.D.   On: 06/19/2017 23:21    Procedures Procedures (including critical care time)  Medications Ordered in ED Medications - No data to display   Initial Impression / Assessment and Plan / ED Course  I have reviewed the triage vital signs and the nursing notes.  Pertinent labs & imaging results that were available during my care of the patient were reviewed by me and considered in my medical decision making (see chart for details).  77 year old female presenting to the ED with head injury from mechanical fall.   She was trying to sit back into a chair and missed the chair causing her to fall to the floor, struck her head on the floor but no loss of consciousness.  Was able to get up and ambulate afterwards independently.  She only complains of pain and soreness in the back of her head.  She denies any dizziness, confusion, numbness, or weakness.  She does take Eliquis.  Neurologic exam here is nonfocal.  Does have a moderate sized hematoma to the right occiput.  Given her chronic anticoagulation, CT head/cervical spine obtained and negative for any acute findings.  Patient remains neurologically intact here.  Feel she is stable for discharge.  Discussed supportive care measures at home for any headaches/soreness.  Follow-up with PCP.  Discussed plan with patient, she acknowledged understanding and agreed with plan of care.  Return precautions given for new or worsening symptoms.  Final  Clinical Impressions(s) / ED Diagnoses   Final diagnoses:  Fall, initial encounter  Injury of head, initial encounter    ED Discharge Orders    None       Larene Pickett, PA-C 06/20/17 0036    Sherwood Gambler, MD 06/24/17 [phone]

## 2017-06-19 NOTE — ED Triage Notes (Signed)
Pt from [facility]. C/o fall while getting out of shower and attempting to get in wheelchair. Fell backwards and hit head. Denies LOC. Pt on Eliquis. Rates pain @ 6/10 to back of head. A&O x4 on arrival.

## 2017-06-20 ENCOUNTER — Encounter: Payer: Self-pay | Admitting: Cardiovascular Disease

## 2017-06-20 NOTE — Telephone Encounter (Signed)
[place], spoke with Dr. Maricela Bo hisself, and he states that pt is to have 1 extraction done. Pt is on Eliquis, does pt need to hold?

## 2017-06-20 NOTE — ED Notes (Signed)
ED Provider at bedside. 

## 2017-06-20 NOTE — Telephone Encounter (Signed)
   Primary Cardiologist: Quay Burow, MD  Chart reviewed as part of pre-operative protocol coverage. H/o PAD, HTN, afib. Dental extractions are considered low risk procedures per guidelines and generally do not require any specific cardiac clearance. It is also generally accepted that for simple extractions, there is no need to interrupt blood thinner therapy. In this case, would not stop Eliquis.   I will route this recommendation to the requesting party via Epic fax function and remove from pre-op pool. Please call with questions.  Will also route back to our office to call patient to remind to schedule annual appt.  Charlie Pitter, PA-C 06/20/2017, 1:53 PM

## 2017-06-20 NOTE — Discharge Instructions (Addendum)
Head and neck CT today looked great-- no internal injuries, fractures, etc. Can continue tylenol for headache-- may be sore sore at the area of impact for a while.  Ice pack may help the swelling as well. Follow-up with your primary care doctor. Return here for any new/acute changes.

## 2017-06-20 NOTE — Telephone Encounter (Signed)
New Message:        Pt's daughter is calling back for prep

## 2017-06-20 NOTE — Telephone Encounter (Signed)
Left pt a message to call re: surgical clearance. Pt has been cleared, but need to remind her to call and schedule her annual f/u appt.

## 2017-06-20 NOTE — ED Notes (Signed)
PTAR contacted for tx back to [facility]

## 2017-06-20 NOTE — ED Notes (Signed)
PTAR at bedside given discharge paperwork.

## 2017-06-20 NOTE — ED Notes (Signed)
This RN spoke with Cecille Rubin, daughter who reported she can not pick up patient. Daughter reported pt will return to [facility] with [place]. Anderson Malta, Network engineer, to set up [employer].

## 2017-06-21 NOTE — Telephone Encounter (Signed)
Spoke with pt dtr, aware of recommendations for tooth extraction and she will call back to schedule follow up appointment with dr berry.

## 2017-06-21 NOTE — Telephone Encounter (Signed)
This encounter was created in error - please disregard.

## 2017-09-23 ENCOUNTER — Other Ambulatory Visit: Payer: Self-pay | Admitting: Family Medicine

## 2017-09-23 DIAGNOSIS — Z1231 Encounter for screening mammogram for malignant neoplasm of breast: Secondary | ICD-10-CM

## 2017-10-09 ENCOUNTER — Ambulatory Visit: Payer: Medicare Other

## 2017-10-15 ENCOUNTER — Ambulatory Visit (INDEPENDENT_AMBULATORY_CARE_PROVIDER_SITE_OTHER): Payer: Medicare Other | Admitting: Adult Health

## 2017-10-15 ENCOUNTER — Encounter: Payer: Self-pay | Admitting: Adult Health

## 2017-10-15 VITALS — BP 120/56 | HR 73 | Ht 63.0 in | Wt 205.0 lb

## 2017-10-15 DIAGNOSIS — R413 Other amnesia: Secondary | ICD-10-CM

## 2017-10-15 NOTE — Progress Notes (Signed)
I have read the note, and I agree with the clinical assessment and plan.  [name]   

## 2017-10-15 NOTE — Patient Instructions (Signed)
Your Plan:  Continue Aricept and namenda Memory score has decreased slightly If your symptoms worsen or you develop new symptoms please let us know.   Thank you for coming to see [place] at [facility]. I hope we have been able to provide you high quality care today.  You may receive a patient satisfaction survey over the next few weeks. We would appreciate your feedback and comments so that we may continue to improve ourselves and the health of our patients.

## 2017-10-15 NOTE — Progress Notes (Signed)
PATIENT: [name] DOB: [birth year]  REASON FOR VISIT: follow up HISTORY FROM: patient  HISTORY OF PRESENT ILLNESS: Today 10/15/17:  [name] is a 77 year old female with a history of memory disturbance.  She returns today for follow-up.  She is now living at [facility].  She states that the transition went well.  She is able to complete all ADLs independently.  Denies any trouble sleeping.  Reports good appetite.  No change in her mood or behavior.  The facility there manages her medications.  Her family manages her finances.  She denies any significant changes.  She returns today for evaluation.  HISTORY 04/04/17 [name] is a 77 year old female with a history of memory disturbance.  She returns today for follow-up.  She reports that she feels that her memory may have gotten slightly worse.  She lives at home alone.  She does complete most ADLs independently.  She reports good appetite.  She reports that she receives Meals on Wheels at lunchtime.  She rarely uses the stove but does use the microwave.  Denies any trouble breathing other than sometimes she may have trouble falling asleep.  Her daughter manages her medications now.  Denies hallucinations and delusions.  Her blood pressure is slightly elevated today.  She reports that she did not take her morning medication yet.  The patient will be moving to [facility] April 1.  The daughter reports that they have a memory unit there if she should need it in the past.  They feel that the patient does need extra assistance and supervision during the day.  Patient returns today for an evaluation.  REVIEW OF SYSTEMS: Out of a complete 14 system review of symptoms, the patient complains only of the following symptoms, and all other reviewed systems are negative.  See HPI  ALLERGIES: Allergies  Allergen Reactions  . Glucophage [Metformin Hydrochloride] Itching and Rash    REPORTED BY PATIENT; STATES SYMPTOMS  APPEARED AFTER THIRD DOES WITHIN THE LAST MONTH.  Marland Kitchen Alendronate Sodium Other (See Comments)    Unknown   . Metformin And Related Other (See Comments)    unknown  . Omnipaque [Iohexol] Other (See Comments)    Unknown   . Valsartan Other (See Comments)    unknown  . Adhesive [Tape] Rash  . Amoxicillin Itching and Rash  . Avelox [Moxifloxacin Hcl In Nacl] Itching and Rash  . Cephalexin Itching and Rash  . Contrast Media [Iodinated Diagnostic Agents] Itching and Rash  . Latex Rash  . Plavix [Clopidogrel Bisulfate] Itching and Rash    HOME MEDICATIONS: Outpatient Medications Prior to Visit  Medication Sig Dispense Refill  . albuterol (PROVENTIL HFA;VENTOLIN HFA) 108 (90 Base) MCG/ACT inhaler Inhale 2 puffs into the lungs every 4 (four) hours as needed for wheezing or shortness of breath. 1 Inhaler 5  . apixaban (ELIQUIS) 5 MG TABS tablet TAKE 1 TABLET(5 MG) BY MOUTH TWICE DAILY 60 tablet 5  . diltiazem (CARTIA XT) 120 MG 24 hr capsule 120 mg of amoxicillin daily.    Marland Kitchen donepezil (ARICEPT) 5 MG tablet Take 1 tablet (5 mg total) by mouth at bedtime. 90 tablet 3  . Guaifenesin (MUCINEX MAXIMUM STRENGTH) 1200 MG TB12 Take 1 tablet (1,200 mg total) by mouth 2 (two) times daily as needed (Cough). 14 each 0  . memantine (NAMENDA) 10 MG tablet TAKE 1 TABLET(10 MG) BY MOUTH TWICE DAILY 180 tablet 1  . metoprolol tartrate (LOPRESSOR) 25 MG tablet Take 1  tablet (25 mg total) by mouth 2 (two) times daily. 60 tablet 1  . SYMBICORT 160-4.5 MCG/ACT inhaler USE 2 PUFFS BY MOUTH TWICE DAILY 10.2 g 2  . vitamin B-12 (CYANOCOBALAMIN) 1000 MCG tablet Take 1,000 mcg by mouth daily.     No facility-administered medications prior to visit.     PAST MEDICAL HISTORY: Past Medical History:  Diagnosis Date  . Arthritis   . Chronic obstructive asthma 05/28/2011  . Claudication ([place])    LEA DOPPLER, 12/10/2011 - FEMORAL-FEMORAL BYPASS GRAFT-occluded; LEFT TIBIAL-appeared occluded;   . COPD (chronic  obstructive pulmonary disease) ([place])   . Diabetes mellitus   . DOE (dyspnea on exertion)    NUCLEAR STRESS TEST, 08/03/2010 - EKG negative for ischemia  . GERD (gastroesophageal reflux disease)   . Hypercholesteremia   . Hypertension   . Hyperthyroidism   . Hypothyroidism   . Memory difficulties 04/19/2015  . Obesity   . PAD (peripheral artery disease) ([place])   . Shortness of breath   . SOB (shortness of breath)    2D ECHO, 03/01/2011 - EF 65-70%, moderate LVH,     PAST SURGICAL HISTORY: Past Surgical History:  Procedure Laterality Date  . ABDOMINAL ANGIOGRAM Left 12/12/2010   Procedure: ABDOMINAL ANGIOGRAM;  Surgeon: Lorretta Harp, MD;  Location: [facility] CATH LAB;  Service: Cardiovascular;  Laterality: Left;  . CARDIAC CATHETERIZATION  10/18/2008   No intervention  . CARDIAC CATHETERIZATION  09/14/2008   Continue medical therapy  . DESCENDING AORTIC ANEURYSM REPAIR W/ STENT     Stent repair  . FEMORAL-FEMORAL BYPASS GRAFT  05/09/2010   Right to Left Fem-Fem BPG by Dr. Scot Dock  . LOWER EXTREMITY ANGIOGRAM N/A 04/05/2014   Procedure: LOWER EXTREMITY ANGIOGRAM;  Surgeon: Lorretta Harp, MD;  Location: [facility] CATH LAB;  Service: Cardiovascular;  Laterality: N/A;  . PERCUTANEOUS STENT INTERVENTION  12/12/2010   Rt common iliac stenosis stented with a 8x77mm ICAST covered stent resulting in a reduction of a 50% in-stent restenosis to 0%  . PERCUTANEOUS STENT INTERVENTION  04/20/2010   Rt common iliac stenosis stented with a 8x18 Genesis OPTA stent balloon resulting in reduciton of a 70% stenosis to 0% residual  . PERCUTANEOUS STENT INTERVENTION  10/18/2008   No intervention  . PERCUTANEOUS STENT INTERVENTION  09/14/2008   Rt common iliac stenosis stented with a 4x10 Cordis stent resulting in a reduction of 99% stenosis to less than 20% residual  . PERCUTANEOUS STENT INTERVENTION Right 12/12/2010   Procedure: PERCUTANEOUS STENT INTERVENTION;  Surgeon: Lorretta Harp, MD;  Location: [facility] CATH LAB;   Service: Cardiovascular;  Laterality: Right;  . TUBAL LIGATION      FAMILY HISTORY: Family History  Problem Relation Age of Onset  . Diabetes Mother   . Heart disease Mother   . Hyperlipidemia Mother   . Cancer Sister   . Allergies Daughter        x3  . Breast cancer Neg Hx     SOCIAL HISTORY: Social History   Socioeconomic History  . Marital status: Divorced    Spouse name: Not on file  . Number of children: 3  . Years of education: 20  . Highest education level: Not on file  Occupational History  . Occupation: Retired    Fish farm manager: RETIRED  Social Needs  . Financial resource strain: Not on file  . Food insecurity:    Worry: Not on file    Inability: Not on file  . Transportation needs:  Medical: Not on file    Non-medical: Not on file  Tobacco Use  . Smoking status: Current Every Day Smoker    Packs/day: 1.00    Years: 47.00    Pack years: 47.00    Types: Cigarettes  . Smokeless tobacco: Never Used  . Tobacco comment: 10/03/16 1 PPD  Substance and Sexual Activity  . Alcohol use: No  . Drug use: No  . Sexual activity: Not Currently  Lifestyle  . Physical activity:    Days per week: Not on file    Minutes per session: Not on file  . Stress: Not on file  Relationships  . Social connections:    Talks on phone: Not on file    Gets together: Not on file    Attends religious service: Not on file    Active member of club or organization: Not on file    Attends meetings of clubs or organizations: Not on file    Relationship status: Not on file  . Intimate partner violence:    Fear of current or ex partner: Not on file    Emotionally abused: Not on file    Physically abused: Not on file    Forced sexual activity: Not on file  Other Topics Concern  . Not on file  Social History Narrative   Lives at home alone   Right-handed   Caffeine: 2 sodas per day      PHYSICAL EXAM  Vitals:   10/15/17 1118  BP: (!) 120/56  Pulse: 73  Weight: 205 lb (93 kg)   Height: 5\' 3"  (1.6 m)   Body mass index is 36.31 kg/m.   MMSE - Mini Mental State Exam 10/15/2017 04/04/2017 10/03/2016  Orientation to time 4 2 1   Orientation to Place 3 4 4   Registration 3 3 3   Attention/ Calculation 1 5 4   Recall 1 2 1   Language- name 2 objects 2 2 2   Language- repeat 1 1 0  Language- follow 3 step command 3 3 3   Language- read & follow direction 1 1 1   Write a sentence 1 1 1   Copy design 0 0 1  Total score 20 24 21      Generalized: Well developed, in no acute distress   Neurological examination  Mentation: Alert oriented to time, place, history taking. Follows all commands speech and language fluent Cranial nerve II-XII:. Extraocular movements were full, visual field were full on confrontational test. Facial sensation and strength were normal. Uvula tongue midline. Head turning and shoulder shrug  were normal and symmetric. Motor: The motor testing reveals 5 over 5 strength of all 4 extremities. Good symmetric motor tone is noted throughout.  Sensory: Sensory testing is intact to soft touch on all 4 extremities. No evidence of extinction is noted.  Coordination: Cerebellar testing reveals good finger-nose-finger and heel-to-shin bilaterally.  Gait and station: Patient uses a wheelchair when ambulating.  Tandem gait not attempted.  DIAGNOSTIC DATA (LABS, IMAGING, TESTING) - I reviewed patient records, labs, notes, testing and imaging myself where available.  Lab Results  Component Value Date   WBC 6.2 04/10/2015   HGB 12.8 04/10/2015   HCT 39.9 04/10/2015   MCV 77.9 (L) 04/10/2015   PLT 215 04/10/2015      Component Value Date/Time   NA 139 03/08/2016 1517   K 4.3 03/08/2016 1517   CL 106 03/08/2016 1517   CO2 21 03/08/2016 1517   GLUCOSE 97 03/08/2016 1517   BUN 16 03/08/2016 1517  CREATININE 1.23 (H) 03/08/2016 1517   CALCIUM 9.7 03/08/2016 1517   PROT 6.5 03/23/2014 1643   ALBUMIN 3.7 03/23/2014 1643   AST 19 03/23/2014 1643   ALT 16  03/23/2014 1643   ALKPHOS 60 03/23/2014 1643   BILITOT 0.3 03/23/2014 1643   GFRNONAA 58 (L) 04/14/2015 0457   GFRAA >60 04/14/2015 0457   No results found for: CHOL, HDL, LDLCALC, LDLDIRECT, TRIG, CHOLHDL Lab Results  Component Value Date   HGBA1C 5.7 (H) 04/10/2015   No results found for: VITAMINB12 Lab Results  Component Value Date   TSH 2.830 03/23/2014      ASSESSMENT AND PLAN 77 y.o. year old female  has a past medical history of Arthritis, Chronic obstructive asthma (05/28/2011), Claudication ([place]), COPD (chronic obstructive pulmonary disease) ([place]), Diabetes mellitus, DOE (dyspnea on exertion), GERD (gastroesophageal reflux disease), Hypercholesteremia, Hypertension, Hyperthyroidism, Hypothyroidism, Memory difficulties (04/19/2015), Obesity, PAD (peripheral artery disease) ([place]), Shortness of breath, and SOB (shortness of breath). here with :  1.  Memory disturbance  The patient's memory score has declined slightly since the last visit.  She will continue on Aricept and Namenda.  We will continue to monitor her memory.  She is advised that if her symptoms worsen or she develops new symptoms she should let us know.  She will follow-up in 6 months or sooner if needed.   I spent 15 minutes with the patient. 50% of this time was spent reviewing her memory score   Ward Givens, MSN, NP-C 10/15/2017, 11:07 AM [facility] [address] [place], [place] [address] [phone]

## 2018-02-03 ENCOUNTER — Ambulatory Visit
Admission: RE | Admit: 2018-02-03 | Discharge: 2018-02-03 | Disposition: A | Discharge status: Home / Self Care | Payer: Medicare Other | Source: Ambulatory Visit | Attending: Family Medicine | Admitting: Family Medicine

## 2018-02-03 DIAGNOSIS — Z1231 Encounter for screening mammogram for malignant neoplasm of breast: Secondary | ICD-10-CM

## 2018-04-21 ENCOUNTER — Telehealth: Payer: Self-pay | Admitting: Neurology

## 2018-04-21 NOTE — Telephone Encounter (Signed)
lVM to discuss changing appt to virtual appt

## 2018-04-28 NOTE — Telephone Encounter (Signed)
I attempted to reach the pt to discuss appt scheduled for 05/02/18. Pt was not available and I could not leave a vm.

## 2018-05-02 ENCOUNTER — Ambulatory Visit: Payer: Medicare Other | Admitting: Neurology

## 2018-07-24 ENCOUNTER — Ambulatory Visit: Payer: Self-pay | Admitting: Neurology

## 2018-11-12 ENCOUNTER — Ambulatory Visit: Payer: Self-pay | Admitting: Neurology

## 2019-01-12 ENCOUNTER — Other Ambulatory Visit: Payer: Self-pay | Admitting: Nurse Practitioner

## 2019-01-12 ENCOUNTER — Other Ambulatory Visit: Payer: Self-pay | Admitting: Pharmacy Technician

## 2019-01-12 DIAGNOSIS — Z1231 Encounter for screening mammogram for malignant neoplasm of breast: Secondary | ICD-10-CM

## 2019-02-23 ENCOUNTER — Ambulatory Visit
Admission: RE | Admit: 2019-02-23 | Discharge: 2019-02-23 | Disposition: A | Discharge status: Home / Self Care | Payer: Medicare Other | Source: Ambulatory Visit | Attending: Nurse Practitioner | Admitting: Nurse Practitioner

## 2019-02-23 ENCOUNTER — Other Ambulatory Visit: Payer: Self-pay

## 2019-02-23 DIAGNOSIS — Z1231 Encounter for screening mammogram for malignant neoplasm of breast: Secondary | ICD-10-CM

## 2020-04-12 ENCOUNTER — Other Ambulatory Visit: Payer: Self-pay | Admitting: Internal Medicine

## 2020-04-12 DIAGNOSIS — Z1231 Encounter for screening mammogram for malignant neoplasm of breast: Secondary | ICD-10-CM

## 2020-06-02 ENCOUNTER — Ambulatory Visit: Payer: Medicare Other

## 2020-07-26 ENCOUNTER — Other Ambulatory Visit: Payer: Self-pay

## 2020-07-26 ENCOUNTER — Ambulatory Visit
Admission: RE | Admit: 2020-07-26 | Discharge: 2020-07-26 | Disposition: A | Discharge status: Home / Self Care | Payer: Medicare Other | Source: Ambulatory Visit | Attending: Internal Medicine | Admitting: Internal Medicine

## 2020-07-26 DIAGNOSIS — Z1231 Encounter for screening mammogram for malignant neoplasm of breast: Secondary | ICD-10-CM
